# Patient Record
Sex: Female | Born: 1950
Health system: Southern US, Community
[De-identification: ages and names within clinical notes are randomized; demographics above are authoritative.]

## PROBLEM LIST (undated history)

## (undated) DIAGNOSIS — T4145XA Adverse effect of unspecified anesthetic, initial encounter: Secondary | ICD-10-CM

## (undated) DIAGNOSIS — K7460 Unspecified cirrhosis of liver: Secondary | ICD-10-CM

## (undated) DIAGNOSIS — E119 Type 2 diabetes mellitus without complications: Secondary | ICD-10-CM

## (undated) DIAGNOSIS — F329 Major depressive disorder, single episode, unspecified: Secondary | ICD-10-CM

## (undated) DIAGNOSIS — C50919 Malignant neoplasm of unspecified site of unspecified female breast: Secondary | ICD-10-CM

## (undated) DIAGNOSIS — I10 Essential (primary) hypertension: Secondary | ICD-10-CM

## (undated) DIAGNOSIS — F419 Anxiety disorder, unspecified: Secondary | ICD-10-CM

## (undated) DIAGNOSIS — E7800 Pure hypercholesterolemia, unspecified: Secondary | ICD-10-CM

## (undated) DIAGNOSIS — T8859XA Other complications of anesthesia, initial encounter: Secondary | ICD-10-CM

## (undated) DIAGNOSIS — D649 Anemia, unspecified: Secondary | ICD-10-CM

## (undated) DIAGNOSIS — F32A Depression, unspecified: Secondary | ICD-10-CM

## (undated) HISTORY — DX: Unspecified cirrhosis of liver: K74.60

## (undated) HISTORY — PX: TONSILLECTOMY AND ADENOIDECTOMY: SHX28

## (undated) HISTORY — PX: GANGLION CYST EXCISION: SHX1691

## (undated) HISTORY — DX: Major depressive disorder, single episode, unspecified: F32.9

## (undated) HISTORY — DX: Depression, unspecified: F32.A

## (undated) HISTORY — DX: Anemia, unspecified: D64.9

## (undated) HISTORY — DX: Pure hypercholesterolemia, unspecified: E78.00

## (undated) HISTORY — DX: Essential (primary) hypertension: I10

## (undated) HISTORY — DX: Type 2 diabetes mellitus without complications: E11.9

## (undated) HISTORY — DX: Anxiety disorder, unspecified: F41.9

## (undated) HISTORY — PX: COLONOSCOPY: SHX174

## (undated) HISTORY — PX: OTHER SURGICAL HISTORY: SHX169

## (undated) HISTORY — PX: BREAST LUMPECTOMY: SHX2

---
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
## 2015-07-25 DIAGNOSIS — Z6834 Body mass index (BMI) 34.0-34.9, adult: Secondary | ICD-10-CM | POA: Diagnosis not present

## 2015-07-25 DIAGNOSIS — E1165 Type 2 diabetes mellitus with hyperglycemia: Secondary | ICD-10-CM | POA: Diagnosis not present

## 2015-07-25 DIAGNOSIS — I10 Essential (primary) hypertension: Secondary | ICD-10-CM | POA: Diagnosis not present

## 2015-10-23 DIAGNOSIS — E1165 Type 2 diabetes mellitus with hyperglycemia: Secondary | ICD-10-CM | POA: Diagnosis not present

## 2015-10-23 DIAGNOSIS — I10 Essential (primary) hypertension: Secondary | ICD-10-CM | POA: Diagnosis not present

## 2015-10-23 DIAGNOSIS — Z0000 Encounter for general adult medical examination without abnormal findings: Secondary | ICD-10-CM | POA: Diagnosis not present

## 2015-11-06 DIAGNOSIS — R928 Other abnormal and inconclusive findings on diagnostic imaging of breast: Secondary | ICD-10-CM | POA: Diagnosis not present

## 2015-11-06 DIAGNOSIS — Z1231 Encounter for screening mammogram for malignant neoplasm of breast: Secondary | ICD-10-CM | POA: Diagnosis not present

## 2015-11-06 DIAGNOSIS — Z9889 Other specified postprocedural states: Secondary | ICD-10-CM | POA: Diagnosis not present

## 2015-11-15 DIAGNOSIS — R590 Localized enlarged lymph nodes: Secondary | ICD-10-CM | POA: Diagnosis not present

## 2015-11-15 DIAGNOSIS — N63 Unspecified lump in breast: Secondary | ICD-10-CM | POA: Diagnosis not present

## 2015-11-15 DIAGNOSIS — C50912 Malignant neoplasm of unspecified site of left female breast: Secondary | ICD-10-CM | POA: Diagnosis not present

## 2015-11-15 DIAGNOSIS — C50412 Malignant neoplasm of upper-outer quadrant of left female breast: Secondary | ICD-10-CM | POA: Diagnosis not present

## 2015-11-27 DIAGNOSIS — D0512 Intraductal carcinoma in situ of left breast: Secondary | ICD-10-CM | POA: Diagnosis not present

## 2015-11-29 DIAGNOSIS — Z7984 Long term (current) use of oral hypoglycemic drugs: Secondary | ICD-10-CM | POA: Diagnosis not present

## 2015-11-29 DIAGNOSIS — E119 Type 2 diabetes mellitus without complications: Secondary | ICD-10-CM | POA: Diagnosis not present

## 2015-11-29 DIAGNOSIS — Z884 Allergy status to anesthetic agent status: Secondary | ICD-10-CM | POA: Diagnosis not present

## 2015-11-29 DIAGNOSIS — Z9049 Acquired absence of other specified parts of digestive tract: Secondary | ICD-10-CM | POA: Diagnosis not present

## 2015-11-29 DIAGNOSIS — Z886 Allergy status to analgesic agent status: Secondary | ICD-10-CM | POA: Diagnosis not present

## 2015-11-29 DIAGNOSIS — F17210 Nicotine dependence, cigarettes, uncomplicated: Secondary | ICD-10-CM | POA: Diagnosis not present

## 2015-11-29 DIAGNOSIS — I10 Essential (primary) hypertension: Secondary | ICD-10-CM | POA: Diagnosis not present

## 2015-11-29 DIAGNOSIS — Z8249 Family history of ischemic heart disease and other diseases of the circulatory system: Secondary | ICD-10-CM | POA: Diagnosis not present

## 2015-11-29 DIAGNOSIS — D0512 Intraductal carcinoma in situ of left breast: Secondary | ICD-10-CM | POA: Diagnosis not present

## 2015-11-29 DIAGNOSIS — Z79899 Other long term (current) drug therapy: Secondary | ICD-10-CM | POA: Diagnosis not present

## 2015-11-29 DIAGNOSIS — Z800 Family history of malignant neoplasm of digestive organs: Secondary | ICD-10-CM | POA: Diagnosis not present

## 2015-11-29 DIAGNOSIS — Z836 Family history of other diseases of the respiratory system: Secondary | ICD-10-CM | POA: Diagnosis not present

## 2015-11-30 DIAGNOSIS — Z800 Family history of malignant neoplasm of digestive organs: Secondary | ICD-10-CM | POA: Diagnosis not present

## 2015-11-30 DIAGNOSIS — I10 Essential (primary) hypertension: Secondary | ICD-10-CM | POA: Diagnosis not present

## 2015-11-30 DIAGNOSIS — Z886 Allergy status to analgesic agent status: Secondary | ICD-10-CM | POA: Diagnosis not present

## 2015-11-30 DIAGNOSIS — Z7984 Long term (current) use of oral hypoglycemic drugs: Secondary | ICD-10-CM | POA: Diagnosis not present

## 2015-11-30 DIAGNOSIS — E119 Type 2 diabetes mellitus without complications: Secondary | ICD-10-CM | POA: Diagnosis not present

## 2015-11-30 DIAGNOSIS — R928 Other abnormal and inconclusive findings on diagnostic imaging of breast: Secondary | ICD-10-CM | POA: Diagnosis not present

## 2015-11-30 DIAGNOSIS — D0512 Intraductal carcinoma in situ of left breast: Secondary | ICD-10-CM | POA: Diagnosis not present

## 2015-11-30 DIAGNOSIS — Z79899 Other long term (current) drug therapy: Secondary | ICD-10-CM | POA: Diagnosis not present

## 2015-11-30 DIAGNOSIS — Z9049 Acquired absence of other specified parts of digestive tract: Secondary | ICD-10-CM | POA: Diagnosis not present

## 2015-11-30 DIAGNOSIS — Z8249 Family history of ischemic heart disease and other diseases of the circulatory system: Secondary | ICD-10-CM | POA: Diagnosis not present

## 2015-11-30 DIAGNOSIS — Z836 Family history of other diseases of the respiratory system: Secondary | ICD-10-CM | POA: Diagnosis not present

## 2015-11-30 DIAGNOSIS — F17210 Nicotine dependence, cigarettes, uncomplicated: Secondary | ICD-10-CM | POA: Diagnosis not present

## 2015-11-30 DIAGNOSIS — C50912 Malignant neoplasm of unspecified site of left female breast: Secondary | ICD-10-CM | POA: Diagnosis not present

## 2015-11-30 DIAGNOSIS — Z884 Allergy status to anesthetic agent status: Secondary | ICD-10-CM | POA: Diagnosis not present

## 2015-12-05 DIAGNOSIS — H2513 Age-related nuclear cataract, bilateral: Secondary | ICD-10-CM | POA: Diagnosis not present

## 2015-12-05 DIAGNOSIS — E119 Type 2 diabetes mellitus without complications: Secondary | ICD-10-CM | POA: Diagnosis not present

## 2015-12-05 DIAGNOSIS — H538 Other visual disturbances: Secondary | ICD-10-CM | POA: Diagnosis not present

## 2015-12-11 DIAGNOSIS — L03818 Cellulitis of other sites: Secondary | ICD-10-CM | POA: Diagnosis not present

## 2016-01-01 ENCOUNTER — Encounter (HOSPITAL_COMMUNITY): Payer: Self-pay | Admitting: Hematology & Oncology

## 2016-01-01 ENCOUNTER — Encounter (HOSPITAL_COMMUNITY): Payer: Medicare Other | Attending: Hematology & Oncology | Admitting: Hematology & Oncology

## 2016-01-01 VITALS — BP 128/59 | HR 61 | Temp 98.1°F | Resp 16 | Ht 67.0 in | Wt 226.1 lb

## 2016-01-01 DIAGNOSIS — F329 Major depressive disorder, single episode, unspecified: Secondary | ICD-10-CM | POA: Insufficient documentation

## 2016-01-01 DIAGNOSIS — Z0000 Encounter for general adult medical examination without abnormal findings: Secondary | ICD-10-CM

## 2016-01-01 DIAGNOSIS — C50912 Malignant neoplasm of unspecified site of left female breast: Secondary | ICD-10-CM | POA: Insufficient documentation

## 2016-01-01 DIAGNOSIS — Z171 Estrogen receptor negative status [ER-]: Secondary | ICD-10-CM

## 2016-01-01 DIAGNOSIS — I10 Essential (primary) hypertension: Secondary | ICD-10-CM | POA: Insufficient documentation

## 2016-01-01 DIAGNOSIS — C50412 Malignant neoplasm of upper-outer quadrant of left female breast: Secondary | ICD-10-CM | POA: Insufficient documentation

## 2016-01-01 DIAGNOSIS — Z720 Tobacco use: Secondary | ICD-10-CM

## 2016-01-01 DIAGNOSIS — F419 Anxiety disorder, unspecified: Secondary | ICD-10-CM | POA: Insufficient documentation

## 2016-01-01 DIAGNOSIS — F17210 Nicotine dependence, cigarettes, uncomplicated: Secondary | ICD-10-CM | POA: Insufficient documentation

## 2016-01-01 DIAGNOSIS — Z79899 Other long term (current) drug therapy: Secondary | ICD-10-CM | POA: Insufficient documentation

## 2016-01-01 DIAGNOSIS — Z7984 Long term (current) use of oral hypoglycemic drugs: Secondary | ICD-10-CM | POA: Insufficient documentation

## 2016-01-01 DIAGNOSIS — Z9889 Other specified postprocedural states: Secondary | ICD-10-CM | POA: Insufficient documentation

## 2016-01-01 MED ORDER — PROCHLORPERAZINE MALEATE 10 MG PO TABS: 10.0000 mg | ORAL_TABLET | Freq: Four times a day (QID) | ORAL | Status: DC | PRN

## 2016-01-01 MED ORDER — ONDANSETRON HCL 8 MG PO TABS: 8.0000 mg | ORAL_TABLET | Freq: Three times a day (TID) | ORAL | Status: DC | PRN

## 2016-01-01 MED ORDER — LIDOCAINE-PRILOCAINE 2.5-2.5 % EX CREA: TOPICAL_CREAM | CUTANEOUS | Status: DC

## 2016-01-01 MED ORDER — DEXAMETHASONE 4 MG PO TABS: ORAL_TABLET | ORAL | Status: DC

## 2016-01-01 NOTE — Patient Instructions (Signed)
[facility]   CHEMOTHERAPY INSTRUCTIONS  Dr. Whitney Muse is your oncologist and she has approved you taking the following drugs for the treatment of your breast cancer. Taxotere and Cytoxan.   You can reach [place] @ [phone] - this number is available 24 hours a day and during after-hours this number will ring over to a nurse on call in [place].   Taxotere and Cytoxan will be given once every 21 days x 4 cycles.    You will be given a medication after the completion of these drugs (x 4 cycles) called Neulasta. We will have you wear the Neulasta On Pro. This is a device that you will wear for 28 hours. Once the medication is finished infusing, you can take it off and throw it away. This medication is being given to boost your white blood cell production. The purpose of this medication is to decrease the amount of days that your white blood cell count stays low. It also helps to keep you on time with each cycle of chemo. The nurse administering chemo to you will teach you about this injection.  You will be at the [facility] approximately 4 hours when you get treated. It may be a little longer depending on laboratory wait time (analyzing your blood work) and pharmacy wait time (the time it takes them to mix your chemotherapy).    Side Effects listed below:  Taxotere - bone marrow suppression (lowers white blood cells (fight infection), lowers red blood cells (make up your blood), lowers platelets (help blood to clot). This chemo can cause fluid retention. You will be responsible for taking a steroid called Dexamethasone at home the day before, day of and day after Taxotere. This steroid will keep you from having fluid retention. Take it whether you think you need it or not. Can cause hair loss, skin/nail changes (darkening of the nail beds, pain where the nail bed meets the skin, loosening of the nail beds, dry skin, palms of hands and soles of feet may darken or  get sensitive, nausea/vomiting, paresthesia (numbness or tingling) in extremities - we need to know if this develops, mucositis (inflammation of any mucosal membrane - the mouth, throat), mouth sores, neurotoxicity (loss of memory, headaches, trouble sleeping, etc.), can also cause excessive tear production. Please let us know if any side effect develops. (takes 1 hour to infuse)  Cytoxan - can cause hemorrhagic cystitis (bloody urine) - this chemo irritates your bladder! We need you drinking 64 oz of fluid (preferably water/decaff fluids) 2 days prior to chemo and for up to 4-5 days after chemo. Drink more if you can. Do not hold your urine. Urinate before you go to bed and if you wake up in the middle of the night. This can also cause nausea/vomiting and hair loss. (takes 30 minutes to infuse)  Neulasta - this medication is not chemo but being given because you have had chemo. It is usually given 27 hours after the completion of chemotherapy. This medication works by boosting your bone marrow's supply of white blood cells. White blood cells are what protect our bodies against infection. The medication is given in the form of a subcutaneous injection. It is given in the fatty tissue of your abdomen. It is a short needle. The major side effect of this medication is bone or muscle pain. The drug of choice to relieve or lessen the pain is Aleve or Ibuprofen. If a physician has ever told you not  to take Aleve or Ibuprofen - then don't take it. You should then take Tylenol/acetaminophen. Take either medication as the bottle directs you to.  The level of pain you experience as a result of this injection can range from none, to mild or moderate, or severe. Please let us know if you develop moderate or severe bone pain.       POTENTIAL SIDE EFFECTS OF TREATMENT: Increased Susceptibility to Infection, Vomiting, Constipation, Hair Thinning, Changes in Character of Skin and Nails (brittleness, dryness,etc.), Pigment  Changes (darkening of nail beds, palms of hands, soles of feet, etc.), Bone Marrow Suppression, Abdominal Cramping, Complete Hair Loss, Nausea, Diarrhea, Sun Sensitivity and Mouth Sores   SELF IMAGE NEEDS AND REFERRALS MADE: Obtain hair accessories as soon as possible (wigs, scarves, turbans,caps,etc.) and Referral to Look Good, Feel Better consultant      MEDICATIONS: You have been given prescriptions for the following medications:  Dexamethasone 41m tablet. The day before, day of, and day after chemo take 2 tabs in the am and 2 tabs in the pm. Take with food.  Zofran/Ondansetron 823mtablet. Take 1 tablet every 8 hours as needed for nausea/vomiting. (#1 nausea med to take, this can constipate)  Compazine/Prochlorperazine 1037mablet. Take 1 tablet every 6 hours as needed for nausea/vomiting. (#2 nausea med to take, this can make you sleepy)   EMLA cream. Apply a quarter size amount to port site 1 hour prior to chemo. Do not rub in. Cover with plastic wrap.   Over-the-Counter Meds:  Miralax 1 capful in 8 oz of fluid daily. May increase to two times a day if needed. This is a stool softener. If this doesn't work proceed you can add:  Senokot S  - start with 1 tablet two times a day and increase to 4 tablets two times a day if needed. (total of 8 tablets in a 24 hour period). This is a stimulant laxative.   Call us [place] this does not help your bowels move.   Imodium 2mg45mpsule. Take 2 capsules after the 1st loose stool and then 1 capsule every 2 hours until you go a total of 12 hours without having a loose stool. Call the [facility] continue. If diarrhea occurs @ bedtime, take 2 capsules @ bedtime. Then take 2 capsules every 4 hours until morning. Call [facility]  SYMPTOMS TO REPORT AS SOON AS POSSIBLE AFTER TREATMENT:  FEVER GREATER THAN 100.5 F  CHILLS WITH OR WITHOUT FEVER  NAUSEA AND VOMITING THAT IS NOT CONTROLLED WITH YOUR NAUSEA  MEDICATION  UNUSUAL SHORTNESS OF BREATH  UNUSUAL BRUISING OR BLEEDING  TENDERNESS IN MOUTH AND THROAT WITH OR WITHOUT PRESENCE OF ULCERS  URINARY PROBLEMS  BOWEL PROBLEMS  UNUSUAL RASH    Wear comfortable clothing and clothing appropriate for easy access to any Portacath or PICC line. Let us [place] if there is anything that we can do to make your therapy better!      I have been informed and understand all of the instructions given to me and have received a copy. I have been instructed to call the clinic ([phone] family physician as soon as possible for continued medical care, if indicated. I do not have any more questions at this time but understand that I may call the [facility] Patient Navigator at ([phone] office hours should I have questions or need assistance in obtaining follow-up care.

## 2016-01-01 NOTE — Progress Notes (Signed)
[place]  CONSULT NOTE  No care team member to display  CHIEF COMPLAINTS/PURPOSE OF CONSULTATION:    Intraductal carcinoma of left breast   11/30/2015 Surgery Lumpectomy and sentinel node procedure with Dr. Ladona Horns   11/30/2015 Surgery Needle localization of the L breast with mammo guidance   11/30/2015 Pathology Results L breast invasive ductal carcinoma, mod diff, 0.8 cm, focal DCIS, high grade, 2/2 LN negative, no LVI, pT1b, pN0M0, ER- PR- HER2 - Ki-67 92%    HISTORY OF PRESENTING ILLNESS:  Taylor Williams 65 y.o. female is here because of newly diagnosed triple negative carcinoma of the L breast. She has undergone definitive surgical therapy with Dr. Ladona Horn[place].  She notes she received mammograms yearly until 2008. She went for routine screening mammography in April and was advised it was abnormal. She notes she never "felt anything" abnormal in her breast. She reports a history of dense breasts.  She is post menopausal having undergone natural menopause. She has not had a hysterectomy. She has not had routine pap smears. In regards to other well care, she has had routine screening colonoscopies.  She notes that her cancer is negative for ER and PR. She also notes that she was told she may need chemotherapy.  She feels optimistic in regards to her prognosis. She presents today for additional discussion of her breast cancer and recommendations regarding therapy.  A lung cancer screening counseling and shared decision making visit. Age: 65 Pack year smoking history: 78 Current smoker or < 15 years of cessation CURRENT CIGARETTE SMOKER   No current symptoms of lung cancer Risks and benefits of lung cancer screening discussed:  Negative- over-diagnosis, radiation exposure, false positives, and additional testing  Positive- discover early stage lung cancer resulting in higher incidence of cure Patient educated regarding the importance of adherence to continued lung cancer  screening. Currently, there are no co-morbidities to prevent treatment to therapy for lung cancer and the patient is agreeable to pursue treatment if a malignancy is discovered.  [place] Preventative Services Task Force recommend annual screening for lung cancer with low-dose CT in adults aged 66- 66 years who have a 30 pack year smoking history and currently smoke or have quit smoking within the past 15 years.  Screening should be discontinued once a person has not smoked for 15 years or develops a health problem that substantially limits life expectancy or the ability or willingness to have curative lung surgery.  It is a category B recommendation.  Similar stances are provided by CMS, NCCN, and AATS.  Currently, there are no co-morbidities to prevent treatment to therapy for lung cancer and the patient is agreeable to pursue treatment if a malignancy is discovered.   MEDICAL HISTORY:  Past Medical History  Diagnosis Date  . Depression   . Hypertension   . High cholesterol   . Anxiety     SURGICAL HISTORY: Past Surgical History  Procedure Laterality Date  . Breast lumpectomy Left     states for ductal carcinoma, had surgery in late may    SOCIAL HISTORY: Social History   Social History  . Marital Status: Widowed    Spouse Name: N/A  . Number of Children: N/A  . Years of Education: N/A   Occupational History  . Not on file.   Social History Main Topics  . Smoking status: Current Every Day Smoker -- 1.00 packs/day for 40 years  . Smokeless tobacco: Not on file  . Alcohol Use: No  .  Drug Use: No  . Sexual Activity: Not on file   Other Topics Concern  . Not on file   Social History Narrative  . No narrative on file  Widowed for 40 years. Her husband was killed in an MVA. He was in the army and they were living in [place] at the time. No children. She never remarried. She has a close family, a close great nephew. Born in [place]. 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  Smoker cigarettes 1ppd for greater than 40 years.  No ETOH. Retired. Enjoys Haematologist, Barrister's clerk, volunteers at [employer].   FAMILY HISTORY: History reviewed. No pertinent family history.  Mother deceased at 60 from colon cancer Father deceased at 24 from complications of COPD, dementia One Brother healthy Maternal brother deceased from esophageal cancer Paternal grandmother had colon cancer and grandfather had lung cancer  ALLERGIES:  is allergic to aspirin; other; and fish allergy.  MEDICATIONS:  Current Outpatient Prescriptions  Medication Sig Dispense Refill  . Cyclophosphamide (CYTOXAN IJ) Inject as directed. Has not started yet - to be given every 21 days x 4 cycles    . DOCEtaxel (TAXOTERE IV) Inject into the vein. Has not started yet - to be given every 21 days x 4 cycles    . Pegfilgrastim (NEULASTA DELIVERY KIT [place]) Inject into the skin. Has not started yet - to be given every 21 days x 4 cycles (after completing chemo)    . ALPRAZolam (XANAX) 0.25 MG tablet Take 0.25 mg by mouth 2 (two) times daily.    . cetirizine (ZYRTEC) 10 MG tablet Take 10 mg by mouth daily.    . citalopram (CELEXA) 20 MG tablet Take 20 mg by mouth daily.    Marland Kitchen dexamethasone (DECADRON) 4 MG tablet The day before, day of, and day after chemo take 2 tabs in the am and 2 tabs in the pm. 48 tablet 0  . glipiZIDE (GLUCOTROL) 10 MG tablet Take 10 mg by mouth 2 (two) times daily before a meal.    . lidocaine-prilocaine (EMLA) cream Apply a quarter size amount to port site 1 hour prior to chemo. Do not rub in. Cover with plastic wrap. 30 g 3  . lisinopril-hydrochlorothiazide (PRINZIDE,ZESTORETIC) 20-12.5 MG tablet Take 1 tablet by mouth daily.    Marland Kitchen loperamide (IMODIUM A-D) 2 MG tablet Take 4 mg by mouth 2 (two) times daily.    . metFORMIN (GLUCOPHAGE) 1000 MG tablet Take 1,000 mg by mouth 2 (two) times daily with a meal.    . omega-3 acid ethyl esters (LOVAZA) 1 g capsule Take  1,500 mg by mouth 2 (two) times daily.    . ondansetron (ZOFRAN) 8 MG tablet Take 1 tablet (8 mg total) by mouth every 8 (eight) hours as needed for nausea or vomiting. 30 tablet 2  . prochlorperazine (COMPAZINE) 10 MG tablet Take 1 tablet (10 mg total) by mouth every 6 (six) hours as needed for nausea or vomiting. 30 tablet 2  . sitaGLIPtin (JANUVIA) 100 MG tablet Take 100 mg by mouth daily.     No current facility-administered medications for this visit.   Review of Systems  Constitutional: Negative for fever, chills, weight loss and malaise/fatigue.  HENT: Negative for congestion, hearing loss, nosebleeds, sore throat and tinnitus.   Eyes: Negative for blurred vision, double vision, pain and discharge.  Respiratory: Negative for cough, hemoptysis, sputum production, shortness of breath and wheezing.   Cardiovascular: Negative for chest  pain, palpitations, claudication, leg swelling and PND.  Gastrointestinal: Negative for heartburn, nausea, vomiting, abdominal pain, diarrhea, constipation, blood in stool and melena.  Genitourinary: Negative for dysuria, urgency, frequency and hematuria.  Musculoskeletal: Negative for myalgias, joint pain and falls.  Skin: Negative for itching and rash.  Neurological: Negative for dizziness, tingling, tremors, sensory change, speech change, focal weakness, seizures, loss of consciousness, weakness and headaches.  Endo/Heme/Allergies: Does not bruise/bleed easily.  Psychiatric/Behavioral: Negative for depression, suicidal ideas, memory loss and substance abuse. The patient is not nervous/anxious and does not have insomnia.    14 point ROS was done and is otherwise as detailed above or in HPI  PHYSICAL EXAMINATION: ECOG PERFORMANCE STATUS: 0 - Asymptomatic  Filed Vitals:   01/01/16 1442  BP: 128/59  Pulse: 61  Temp: 98.1 F (36.7 C)  Resp: 16   Filed Weights   01/01/16 1442  Weight: 226 lb 1.6 oz (102.558 kg)   Physical Exam  Constitutional:  She is oriented to person, place, and time and well-developed, well-nourished, and in no distress.  HENT:  Head: Normocephalic and atraumatic.  Nose: Nose normal.  Mouth/Throat: Oropharynx is clear and moist. No oropharyngeal exudate.  Eyes: Conjunctivae and EOM are normal. Pupils are equal, round, and reactive to light. Right eye exhibits no discharge. Left eye exhibits no discharge. No scleral icterus.  Neck: Normal range of motion. Neck supple. No tracheal deviation present. No thyromegaly present.  Cardiovascular: Normal rate, regular rhythm and normal heart sounds.  Exam reveals no gallop and no friction rub.   No murmur heard. Pulmonary/Chest: Effort normal and breath sounds normal. She has no wheezes. She has no rales.  Abdominal: Soft. Bowel sounds are normal. She exhibits no distension and no mass. There is no tenderness. There is no rebound and no guarding.  Musculoskeletal: Normal range of motion. She exhibits no edema.  Lymphadenopathy:    She has no cervical adenopathy.  Neurological: She is alert and oriented to person, place, and time. She has normal reflexes. No cranial nerve deficit. Gait normal. Coordination normal.  Skin: Skin is warm and dry. No rash noted.  Psychiatric: Mood, memory, affect and judgment normal.  Nursing note and vitals reviewed. Left breast examined with well healing breast and axillary incision sites. No erythema, no drainage. Good ROM of LUE. No lymphedema.  LABORATORY DATA:  I have reviewed the data as listed No results found for: WBC, HGB, HCT, MCV, PLT CMP  No results found for: NA, K, CL, CO2, GLUCOSE, BUN, CREATININE, CALCIUM, PROT, ALBUMIN, AST, ALT, ALKPHOS, BILITOT, GFRNONAA, GFRAA  RADIOGRAPHIC STUDIES: I have personally reviewed the radiological images as listed and agreed with the findings in the report. No results found.  ASSESSMENT & PLAN:  Stage I ER- PR- HER 2 neu - carcinoma of the L breast Lumpectomy and sentinel LN biopsy with  Dr. Ladona Horns on 11/30/2015 Tobacco Abuse  We discussed breast cancer in a general sense. We reviewed hormone receptors and HER 2 in detail.  We discussed triple negative breast cancer. We reviewed NCCN guidelines in regards to triple negative breast cancer. I advised her that based upon her tumor size current recommendations were to consider chemotherapy. After a lengthy discussion she wished to proceed. I have recommended TC x 4. We discussed risks/benefits of chemotherapy with risks including but not limited to hair loss, increased risk of infections, low blood counts, neuropathy.   She will attend chemotherapy class on Tuesday. I have referred her back to Dr. Ladona Horns for  port a cath placement.  We discussed smoking cessation in detail. She has a significant smoking history (see HPI) I have ordered CT screening. She is up to date in regards to colonoscopy.   We reviewed the role of adjuvant XRT in the management of breast cancer after lumpectomy.  She understands that this will occur after completion of chemotherapy.  I will see her back post port placement. She was provided with multiple sources of educational information.  I advised her to bring questions to her return appointment.  ORDERS PLACED FOR THIS ENCOUNTER: Orders Placed This Encounter  Procedures  . CT CHEST LUNG CA SCREEN LOW DOSE W/O CM   All questions were answered. The patient knows to call the clinic with any problems, questions or concerns.  This note was electronically signed.    Molli Hazard, MD  01/03/2016 8:31 AM

## 2016-01-01 NOTE — Patient Instructions (Signed)
[place] at [facility] Discharge Instructions  RECOMMENDATIONS MADE BY THE CONSULTANT AND ANY TEST RESULTS WILL BE SENT TO YOUR REFERRING PHYSICIAN.  CT screen of chest due to smoking history  Triple negative breast cancer  You will need a port  Taxotere and Cytoxan x 4 cycles.   Chemo teaching this Wednesday. Come to [facility] and let the receptionist know that you are there for chemo teaching. You will go to the [place]. This occurs from 11-12:30 on Wednesdays.   Thank you for choosing [place] at [facility] to provide your oncology and hematology care.  To afford each patient quality time with our provider, please arrive at least 15 minutes before your scheduled appointment time.   Beginning January 23rd 2017 lab work for the [facility] will be done in the  Main lab at [facility] on 1st floor. If you have a lab appointment with the [place] please come in thru the  Main Entrance and check in at the main information desk  You need to re-schedule your appointment should you arrive 10 or more minutes late.  We strive to give you quality time with our providers, and arriving late affects you and other patients whose appointments are after yours.  Also, if you no show three or more times for appointments you may be dismissed from the clinic at the providers discretion.     Again, thank you for choosing [facility].  Our hope is that these requests will decrease the amount of time that you wait before being seen by our physicians.       _____________________________________________________________  Should you have questions after your visit to [facility], please contact our office at [phone] between the hours of 8:30 a.m. and 4:30 p.m.  Voicemails left after 4:30 p.m. will not be returned until the following business day.  For prescription refill requests, have your pharmacy contact our  office.         Resources For Cancer Patients and their Caregivers ? American Cancer Society: Can assist with transportation, wigs, general needs, runs Look Good Feel Better.        551-536-7611 ? Cancer Care: Provides financial assistance, online support groups, medication/co-pay assistance.  1-800-813-HOPE 4056572683) ? [facility] Assists [place] cancer patients and their families through emotional , educational and financial support.  (705)596-3261 ? [place] DSS Where to apply for food stamps, Medicaid and utility assistance. 319-681-4775 ? RCATS: Transportation to medical appointments. 931-015-2985 ? Social Security Administration: May apply for disability if have a Stage IV cancer. 276-352-9671 579-615-4648 ? [employer], Disability and Transit Services: Assists with nutrition, care and transit needs. [place] Support Programs: @10RELATIVEDAYS @ > Cancer Support Group  2nd Tuesday of the month 1pm-2pm, Journey Room  > Creative Journey  3rd Tuesday of the month 1130am-1pm, Journey Room  > Look Good Feel Better  1st Wednesday of the month 10am-12 noon, Journey Room (Call [place] to register [phone])

## 2016-01-02 ENCOUNTER — Encounter (HOSPITAL_COMMUNITY): Payer: Self-pay | Attending: Hematology & Oncology

## 2016-01-02 DIAGNOSIS — D0512 Intraductal carcinoma in situ of left breast: Secondary | ICD-10-CM

## 2016-01-03 ENCOUNTER — Encounter (HOSPITAL_COMMUNITY): Payer: Self-pay | Admitting: Hematology & Oncology

## 2016-01-03 NOTE — Progress Notes (Signed)
Pt had chemo class in the Journey Room on 01/02/16

## 2016-01-09 ENCOUNTER — Other Ambulatory Visit (HOSPITAL_COMMUNITY): Payer: Self-pay | Admitting: *Deleted

## 2016-01-09 DIAGNOSIS — C50912 Malignant neoplasm of unspecified site of left female breast: Secondary | ICD-10-CM | POA: Diagnosis not present

## 2016-01-09 DIAGNOSIS — Z886 Allergy status to analgesic agent status: Secondary | ICD-10-CM | POA: Diagnosis not present

## 2016-01-09 DIAGNOSIS — C50412 Malignant neoplasm of upper-outer quadrant of left female breast: Secondary | ICD-10-CM

## 2016-01-09 DIAGNOSIS — D0512 Intraductal carcinoma in situ of left breast: Secondary | ICD-10-CM | POA: Diagnosis not present

## 2016-01-09 DIAGNOSIS — Z884 Allergy status to anesthetic agent status: Secondary | ICD-10-CM | POA: Diagnosis not present

## 2016-01-09 DIAGNOSIS — Z452 Encounter for adjustment and management of vascular access device: Secondary | ICD-10-CM | POA: Diagnosis not present

## 2016-01-09 DIAGNOSIS — Z7984 Long term (current) use of oral hypoglycemic drugs: Secondary | ICD-10-CM | POA: Diagnosis not present

## 2016-01-09 DIAGNOSIS — C50919 Malignant neoplasm of unspecified site of unspecified female breast: Secondary | ICD-10-CM | POA: Diagnosis not present

## 2016-01-09 DIAGNOSIS — F418 Other specified anxiety disorders: Secondary | ICD-10-CM | POA: Diagnosis not present

## 2016-01-09 DIAGNOSIS — I10 Essential (primary) hypertension: Secondary | ICD-10-CM | POA: Diagnosis not present

## 2016-01-09 DIAGNOSIS — Z79899 Other long term (current) drug therapy: Secondary | ICD-10-CM | POA: Diagnosis not present

## 2016-01-09 DIAGNOSIS — E119 Type 2 diabetes mellitus without complications: Secondary | ICD-10-CM | POA: Diagnosis not present

## 2016-01-09 MED ORDER — DEXAMETHASONE 4 MG PO TABS: ORAL_TABLET | ORAL | Status: DC

## 2016-01-12 ENCOUNTER — Encounter (HOSPITAL_COMMUNITY): Payer: Self-pay

## 2016-01-12 ENCOUNTER — Encounter (HOSPITAL_BASED_OUTPATIENT_CLINIC_OR_DEPARTMENT_OTHER): Payer: Medicare Other

## 2016-01-12 ENCOUNTER — Inpatient Hospital Stay (HOSPITAL_COMMUNITY): Payer: Medicare Other

## 2016-01-12 ENCOUNTER — Encounter (HOSPITAL_BASED_OUTPATIENT_CLINIC_OR_DEPARTMENT_OTHER): Payer: Medicare Other | Admitting: Hematology & Oncology

## 2016-01-12 VITALS — BP 132/53 | HR 64 | Temp 97.5°F | Resp 18 | Wt 226.6 lb

## 2016-01-12 DIAGNOSIS — Z5111 Encounter for antineoplastic chemotherapy: Secondary | ICD-10-CM

## 2016-01-12 DIAGNOSIS — C50412 Malignant neoplasm of upper-outer quadrant of left female breast: Secondary | ICD-10-CM

## 2016-01-12 DIAGNOSIS — Z79899 Other long term (current) drug therapy: Secondary | ICD-10-CM | POA: Diagnosis not present

## 2016-01-12 DIAGNOSIS — Z7984 Long term (current) use of oral hypoglycemic drugs: Secondary | ICD-10-CM | POA: Diagnosis not present

## 2016-01-12 DIAGNOSIS — Z720 Tobacco use: Secondary | ICD-10-CM | POA: Diagnosis not present

## 2016-01-12 DIAGNOSIS — R739 Hyperglycemia, unspecified: Secondary | ICD-10-CM

## 2016-01-12 DIAGNOSIS — Z5189 Encounter for other specified aftercare: Secondary | ICD-10-CM | POA: Diagnosis not present

## 2016-01-12 DIAGNOSIS — C50912 Malignant neoplasm of unspecified site of left female breast: Secondary | ICD-10-CM | POA: Diagnosis not present

## 2016-01-12 DIAGNOSIS — F329 Major depressive disorder, single episode, unspecified: Secondary | ICD-10-CM | POA: Diagnosis not present

## 2016-01-12 DIAGNOSIS — F17210 Nicotine dependence, cigarettes, uncomplicated: Secondary | ICD-10-CM | POA: Diagnosis not present

## 2016-01-12 DIAGNOSIS — I10 Essential (primary) hypertension: Secondary | ICD-10-CM | POA: Diagnosis not present

## 2016-01-12 DIAGNOSIS — Z9889 Other specified postprocedural states: Secondary | ICD-10-CM | POA: Diagnosis not present

## 2016-01-12 DIAGNOSIS — F419 Anxiety disorder, unspecified: Secondary | ICD-10-CM | POA: Diagnosis not present

## 2016-01-12 LAB — COMPREHENSIVE METABOLIC PANEL
ALT: 20 U/L (ref 14–54)
AST: 23 U/L (ref 15–41)
Albumin: 3.4 g/dL — ABNORMAL LOW (ref 3.5–5.0)
Alkaline Phosphatase: 45 U/L (ref 38–126)
Anion gap: 7 (ref 5–15)
BUN: 13 mg/dL (ref 6–20)
CHLORIDE: 98 mmol/L — AB (ref 101–111)
CO2: 26 mmol/L (ref 22–32)
CREATININE: 0.68 mg/dL (ref 0.44–1.00)
Calcium: 9.2 mg/dL (ref 8.9–10.3)
GFR calc Af Amer: >60 mL/min (ref >60)
GLUCOSE: 281 mg/dL — AB (ref 65–99)
POTASSIUM: 4.3 mmol/L (ref 3.5–5.1)
Sodium: 131 mmol/L — ABNORMAL LOW (ref 135–145)
Total Bilirubin: 0.5 mg/dL (ref 0.3–1.2)
Total Protein: 7.3 g/dL (ref 6.5–8.1)

## 2016-01-12 LAB — CBC WITH DIFFERENTIAL/PLATELET
Basophils Absolute: 0 10*3/uL (ref 0.0–0.1)
Basophils Relative: 0 %
Eosinophils Absolute: 0 10*3/uL (ref 0.0–0.7)
Eosinophils Relative: 0 %
HEMATOCRIT: 37.4 % (ref 36.0–46.0)
HEMOGLOBIN: 12.7 g/dL (ref 12.0–15.0)
LYMPHS ABS: 1.1 10*3/uL (ref 0.7–4.0)
Lymphocytes Relative: 12 %
MCH: 28.8 pg (ref 26.0–34.0)
MCHC: 34 g/dL (ref 30.0–36.0)
MCV: 84.8 fL (ref 78.0–100.0)
MONO ABS: 0.5 10*3/uL (ref 0.1–1.0)
MONOS PCT: 6 %
NEUTROS ABS: 6.9 10*3/uL (ref 1.7–7.7)
NEUTROS PCT: 81 %
Platelets: 225 10*3/uL (ref 150–400)
RBC: 4.41 MIL/uL (ref 3.87–5.11)
RDW: 14.9 % (ref 11.5–15.5)
WBC: 8.5 10*3/uL (ref 4.0–10.5)

## 2016-01-12 MED ORDER — LORAZEPAM 2 MG/ML IJ SOLN
0.5000 mg | Freq: Once | INTRAMUSCULAR | Status: AC
  Administered 2016-01-12: 11:00:00 via INTRAVENOUS
  Filled 2016-01-12: qty 1

## 2016-01-12 MED ORDER — LORAZEPAM 0.5 MG PO TABS: 0.5000 mg | ORAL_TABLET | ORAL | Status: DC | PRN

## 2016-01-12 MED ORDER — PROMETHAZINE HCL 25 MG PO TABS: 25.0000 mg | ORAL_TABLET | Freq: Four times a day (QID) | ORAL | Status: DC | PRN

## 2016-01-12 MED ORDER — SODIUM CHLORIDE 0.9 % IV SOLN
600.0000 mg/m2 | Freq: Once | INTRAVENOUS | Status: AC
  Administered 2016-01-12: 1320 mg via INTRAVENOUS
  Filled 2016-01-12: qty 50

## 2016-01-12 MED ORDER — SODIUM CHLORIDE 0.9% FLUSH: 10.0000 mL | INTRAVENOUS | Status: DC | PRN

## 2016-01-12 MED ORDER — PALONOSETRON HCL INJECTION 0.25 MG/5ML
INTRAVENOUS | Status: AC
  Filled 2016-01-12: qty 5

## 2016-01-12 MED ORDER — SODIUM CHLORIDE 0.9 % IV SOLN
Freq: Once | INTRAVENOUS | Status: AC
Start: 2016-01-12 — End: 2016-01-12
  Administered 2016-01-12: 11:00:00 via INTRAVENOUS

## 2016-01-12 MED ORDER — PEGFILGRASTIM 6 MG/0.6ML ~~LOC~~ PSKT
6.0000 mg | PREFILLED_SYRINGE | Freq: Once | SUBCUTANEOUS | Status: AC
  Administered 2016-01-12: 6 mg via SUBCUTANEOUS

## 2016-01-12 MED ORDER — INSULIN ASPART 100 UNIT/ML ~~LOC~~ SOLN
6.0000 [IU] | Freq: Once | SUBCUTANEOUS | Status: AC
  Administered 2016-01-12: 6 [IU] via SUBCUTANEOUS
  Filled 2016-01-12: qty 0.06

## 2016-01-12 MED ORDER — PEGFILGRASTIM 6 MG/0.6ML ~~LOC~~ PSKT
PREFILLED_SYRINGE | SUBCUTANEOUS | Status: AC
  Filled 2016-01-12: qty 0.6

## 2016-01-12 MED ORDER — DEXAMETHASONE SODIUM PHOSPHATE 100 MG/10ML IJ SOLN
10.0000 mg | Freq: Once | INTRAMUSCULAR | Status: AC
  Administered 2016-01-12: 10 mg via INTRAVENOUS
  Filled 2016-01-12: qty 1

## 2016-01-12 MED ORDER — HEPARIN SOD (PORK) LOCK FLUSH 100 UNIT/ML IV SOLN
500.0000 [IU] | Freq: Once | INTRAVENOUS | Status: AC | PRN
  Administered 2016-01-12: 500 [IU]
  Filled 2016-01-12 (×2): qty 5

## 2016-01-12 MED ORDER — PALONOSETRON HCL INJECTION 0.25 MG/5ML
0.2500 mg | Freq: Once | INTRAVENOUS | Status: AC
  Administered 2016-01-12: 0.25 mg via INTRAVENOUS

## 2016-01-12 MED ORDER — DOCETAXEL CHEMO INJECTION 160 MG/16ML
75.0000 mg/m2 | Freq: Once | INTRAVENOUS | Status: AC
  Administered 2016-01-12: 170 mg via INTRAVENOUS
  Filled 2016-01-12: qty 9

## 2016-01-12 NOTE — Progress Notes (Signed)
Taylor Williams Tolerated chemotherapy well Discharged ambulatory   .Taylor Sidles Boggspresents today for neulasta OBI placement per MD orders. OBI device filled per protocol and placed on right Upper Arm. Needle/catheter placement noted prior to patient leaving. Tolerated without incident and aware of injection to be delivered in  27 hours.

## 2016-01-12 NOTE — Patient Instructions (Signed)
[facility] Discharge Instructions for Patients Receiving Chemotherapy   Beginning January 23rd 2017 lab work for the [facility] will be done in the  Main lab at [facility] on 1st floor. If you have a lab appointment with the [facility] please come in thru the  Main Entrance and check in at the main information desk   Today you received the following chemotherapy agents taxotere and cytoxan  Side effects of taxotere that you might experience include: Low white blood cell count Fluid retention  Hair loss Neuropathy Nausea and vomiting Constipation  Taste changes fatigue Nail changes Watery eyes  Side effects that you could experience from cytoxan include: Appetite loss color change in skin Hair loss Nausea Skin rash Change in nails Vomiting  fatigue    Docetaxel injection What is this medicine? DOCETAXEL (doe se TAX el) is a chemotherapy drug. It targets fast dividing cells, like cancer cells, and causes these cells to die. This medicine is used to treat many types of cancers like breast cancer, certain stomach cancers, head and neck cancer, lung cancer, and prostate cancer. This medicine may be used for other purposes; ask your health care provider or pharmacist if you have questions. What should I tell my health care provider before I take this medicine? They need to know if you have any of these conditions: -infection (especially a virus infection such as chickenpox, cold sores, or herpes) -liver disease -low blood counts, like low white cell, platelet, or red cell counts -an unusual or allergic reaction to docetaxel, polysorbate 80, other chemotherapy agents, other medicines, foods, dyes, or preservatives -pregnant or trying to get pregnant -breast-feeding How should I use this medicine? This drug is given as an infusion into a vein. It is administered in a hospital or clinic by a specially trained health care professional. Talk to your  pediatrician regarding the use of this medicine in children. Special care may be needed. Overdosage: If you think you have taken too much of this medicine contact a poison control center or emergency room at once. NOTE: This medicine is only for you. Do not share this medicine with others. What if I miss a dose? It is important not to miss your dose. Call your doctor or health care professional if you are unable to keep an appointment. What may interact with this medicine? -cyclosporine -erythromycin -ketoconazole -medicines to increase blood counts like filgrastim, pegfilgrastim, sargramostim -vaccines Talk to your doctor or health care professional before taking any of these medicines: -acetaminophen -aspirin -ibuprofen -ketoprofen -naproxen This list may not describe all possible interactions. Give your health care provider a list of all the medicines, herbs, non-prescription drugs, or dietary supplements you use. Also tell them if you smoke, drink alcohol, or use illegal drugs. Some items may interact with your medicine. What should I watch for while using this medicine? Your condition will be monitored carefully while you are receiving this medicine. You will need important blood work done while you are taking this medicine. This drug may make you feel generally unwell. This is not uncommon, as chemotherapy can affect healthy cells as well as cancer cells. Report any side effects. Continue your course of treatment even though you feel ill unless your doctor tells you to stop. In some cases, you may be given additional medicines to help with side effects. Follow all directions for their use. Call your doctor or health care professional for advice if you get a fever, chills or sore throat, or  other symptoms of a cold or flu. Do not treat yourself. This drug decreases your body's ability to fight infections. Try to avoid being around people who are sick. This medicine may increase your risk  to bruise or bleed. Call your doctor or health care professional if you notice any unusual bleeding. This medicine may contain alcohol in the product. You may get drowsy or dizzy. Do not drive, use machinery, or do anything that needs mental alertness until you know how this medicine affects you. Do not stand or sit up quickly, especially if you are an older patient. This reduces the risk of dizzy or fainting spells. Avoid alcoholic drinks. Do not become pregnant while taking this medicine. Women should inform their doctor if they wish to become pregnant or think they might be pregnant. There is a potential for serious side effects to an unborn child. Talk to your health care professional or pharmacist for more information. Do not breast-feed an infant while taking this medicine. What side effects may I notice from receiving this medicine? Side effects that you should report to your doctor or health care professional as soon as possible: -allergic reactions like skin rash, itching or hives, swelling of the face, lips, or tongue -low blood counts - This drug may decrease the number of white blood cells, red blood cells and platelets. You may be at increased risk for infections and bleeding. -signs of infection - fever or chills, cough, sore throat, pain or difficulty passing urine -signs of decreased platelets or bleeding - bruising, pinpoint red spots on the skin, black, tarry stools, nosebleeds -signs of decreased red blood cells - unusually weak or tired, fainting spells, lightheadedness -breathing problems -fast or irregular heartbeat -low blood pressure -mouth sores -nausea and vomiting -pain, swelling, redness or irritation at the injection site -pain, tingling, numbness in the hands or feet -swelling of the ankle, feet, hands -weight gain Side effects that usually do not require medical attention (report to your prescriber or health care professional if they continue or are  bothersome): -bone pain -complete hair loss including hair on your head, underarms, pubic hair, eyebrows, and eyelashes -diarrhea -excessive tearing -changes in the color of fingernails -loosening of the fingernails -nausea -muscle pain -red flush to skin -sweating -weak or tired This list may not describe all possible side effects. Call your doctor for medical advice about side effects. You may report side effects to FDA at 1-800-FDA-1088. Where should I keep my medicine? This drug is given in a hospital or clinic and will not be stored at home. NOTE: This sheet is a summary. It may not cover all possible information. If you have questions about this medicine, talk to your doctor, pharmacist, or health care provider.    2016, Elsevier/Gold Standard. (2014-08-01 16:04:57)     Cyclophosphamide injection What is this medicine? CYCLOPHOSPHAMIDE (sye kloe FOSS fa mide) is a chemotherapy drug. It slows the growth of cancer cells. This medicine is used to treat many types of cancer like lymphoma, myeloma, leukemia, breast cancer, and ovarian cancer, to name a few. This medicine may be used for other purposes; ask your health care provider or pharmacist if you have questions. What should I tell my health care provider before I take this medicine? They need to know if you have any of these conditions: -blood disorders -history of other chemotherapy -infection -kidney disease -liver disease -recent or ongoing radiation therapy -tumors in the bone marrow -an unusual or allergic reaction to cyclophosphamide, other chemotherapy,  other medicines, foods, dyes, or preservatives -pregnant or trying to get pregnant -breast-feeding How should I use this medicine? This drug is usually given as an injection into a vein or muscle or by infusion into a vein. It is administered in a hospital or clinic by a specially trained health care professional. Talk to your pediatrician regarding the use of  this medicine in children. Special care may be needed. Overdosage: If you think you have taken too much of this medicine contact a poison control center or emergency room at once. NOTE: This medicine is only for you. Do not share this medicine with others. What if I miss a dose? It is important not to miss your dose. Call your doctor or health care professional if you are unable to keep an appointment. What may interact with this medicine? This medicine may interact with the following medications: -amiodarone -amphotericin B -azathioprine -certain antiviral medicines for HIV or AIDS such as protease inhibitors (e.g., indinavir, ritonavir) and zidovudine -certain blood pressure medications such as benazepril, captopril, enalapril, fosinopril, lisinopril, moexipril, monopril, perindopril, quinapril, ramipril, trandolapril -certain cancer medications such as anthracyclines (e.g., daunorubicin, doxorubicin), busulfan, cytarabine, paclitaxel, pentostatin, tamoxifen, trastuzumab -certain diuretics such as chlorothiazide, chlorthalidone, hydrochlorothiazide, indapamide, metolazone -certain medicines that treat or prevent blood clots like warfarin -certain muscle relaxants such as succinylcholine -cyclosporine -etanercept -indomethacin -medicines to increase blood counts like filgrastim, pegfilgrastim, sargramostim -medicines used as general anesthesia -metronidazole -natalizumab This list may not describe all possible interactions. Give your health care provider a list of all the medicines, herbs, non-prescription drugs, or dietary supplements you use. Also tell them if you smoke, drink alcohol, or use illegal drugs. Some items may interact with your medicine. What should I watch for while using this medicine? Visit your doctor for checks on your progress. This drug may make you feel generally unwell. This is not uncommon, as chemotherapy can affect healthy cells as well as cancer cells. Report any  side effects. Continue your course of treatment even though you feel ill unless your doctor tells you to stop. Drink water or other fluids as directed. Urinate often, even at night. In some cases, you may be given additional medicines to help with side effects. Follow all directions for their use. Call your doctor or health care professional for advice if you get a fever, chills or sore throat, or other symptoms of a cold or flu. Do not treat yourself. This drug decreases your body's ability to fight infections. Try to avoid being around people who are sick. This medicine may increase your risk to bruise or bleed. Call your doctor or health care professional if you notice any unusual bleeding. Be careful brushing and flossing your teeth or using a toothpick because you may get an infection or bleed more easily. If you have any dental work done, tell your dentist you are receiving this medicine. You may get drowsy or dizzy. Do not drive, use machinery, or do anything that needs mental alertness until you know how this medicine affects you. Do not become pregnant while taking this medicine or for 1 year after stopping it. Women should inform their doctor if they wish to become pregnant or think they might be pregnant. Men should not father a child while taking this medicine and for 4 months after stopping it. There is a potential for serious side effects to an unborn child. Talk to your health care professional or pharmacist for more information. Do not breast-feed an infant while taking this medicine.  This medicine may interfere with the ability to have a child. This medicine has caused ovarian failure in some women. This medicine has caused reduced sperm counts in some men. You should talk with your doctor or health care professional if you are concerned about your fertility. If you are going to have surgery, tell your doctor or health care professional that you have taken this medicine. What side effects  may I notice from receiving this medicine? Side effects that you should report to your doctor or health care professional as soon as possible: -allergic reactions like skin rash, itching or hives, swelling of the face, lips, or tongue -low blood counts - this medicine may decrease the number of white blood cells, red blood cells and platelets. You may be at increased risk for infections and bleeding. -signs of infection - fever or chills, cough, sore throat, pain or difficulty passing urine -signs of decreased platelets or bleeding - bruising, pinpoint red spots on the skin, black, tarry stools, blood in the urine -signs of decreased red blood cells - unusually weak or tired, fainting spells, lightheadedness -breathing problems -dark urine -dizziness -palpitations -swelling of the ankles, feet, hands -trouble passing urine or change in the amount of urine -weight gain -yellowing of the eyes or skin Side effects that usually do not require medical attention (report to your doctor or health care professional if they continue or are bothersome): -changes in nail or skin color -hair loss -missed menstrual periods -mouth sores -nausea, vomiting This list may not describe all possible side effects. Call your doctor for medical advice about side effects. You may report side effects to FDA at 1-800-FDA-1088. Where should I keep my medicine? This drug is given in a hospital or clinic and will not be stored at home. NOTE: This sheet is a summary. It may not cover all possible information. If you have questions about this medicine, talk to your doctor, pharmacist, or health care provider.    2016, Elsevier/Gold Standard. (2012-05-29 16:22:58)     To help prevent nausea and vomiting after your treatment, we encourage you to take your nausea medication .   If you develop nausea and vomiting, or diarrhea that is not controlled by your medication, call the clinic.  The clinic phone number is  [phone]. Office hours are Monday-Friday 8:30am-5:00pm.  BELOW ARE SYMPTOMS THAT SHOULD BE REPORTED IMMEDIATELY:  *FEVER GREATER THAN 101.0 F  *CHILLS WITH OR WITHOUT FEVER  NAUSEA AND VOMITING THAT IS NOT CONTROLLED WITH YOUR NAUSEA MEDICATION  *UNUSUAL SHORTNESS OF BREATH  *UNUSUAL BRUISING OR BLEEDING  TENDERNESS IN MOUTH AND THROAT WITH OR WITHOUT PRESENCE OF ULCERS  *URINARY PROBLEMS  *BOWEL PROBLEMS  UNUSUAL RASH Items with * indicate a potential emergency and should be followed up as soon as possible. If you have an emergency after office hours please contact your primary care physician or go to the nearest emergency department.  Please call the clinic during office hours if you have any questions or concerns.   You may also contact the Patient Navigator at [phone] should you have any questions or need assistance in obtaining follow up care.      Resources For Cancer Patients and their Caregivers ? American Cancer Society: Can assist with transportation, wigs, general needs, runs Look Good Feel Better.        985 511 1923 ? Cancer Care: Provides financial assistance, online support groups, medication/co-pay assistance.  1-800-813-HOPE 530 512 9830) ? [place] Assists [place] cancer  patients and their families through emotional , educational and financial support.  (832) 160-0224 ? [place] DSS Where to apply for food stamps, Medicaid and utility assistance. (346)269-4686 ? [facility]: Transportation to medical appointments. 520 759 1800 ? Social Security Administration: May apply for disability if have a Stage IV cancer. (905)558-8593 (670) 560-7271 ? [employer], Disability and Transit Services: Assists with nutrition, care and transit needs. [phone]

## 2016-01-12 NOTE — Progress Notes (Signed)
[facility]  Progress Note  No care team member to display  CHIEF COMPLAINTS:    Carcinoma of upper-outer quadrant of left female breast ([place])   11/30/2015 Surgery Lumpectomy and sentinel node procedure with Dr. Ladona Horns   11/30/2015 Surgery Needle localization of the L breast with mammo guidance   11/30/2015 Pathology Results L breast invasive ductal carcinoma, mod diff, 0.8 cm, focal DCIS, high grade, 2/2 LN negative, no LVI, pT1b, pN0M0, ER- PR- HER2 - Ki-67 92%    HISTORY OF PRESENTING ILLNESS:  Taylor Williams 65 y.o. female is here for follow-up of stage I triple negative carcinoma of the L breast. She has undergone definitive surgical therapy with Dr. Ladona Horns in [place].  Taylor Williams is accompanied by a female friend. Presents in treatment chair. She is here for Cycle #1 TC.   Admits she is scared about this first treatment. She took a xanax this morning. States "I am a complete control freak" and treatment really dawned on her when she spoke about starting chemotherapy at church last Sunday. Speaking about this out loud humbled her and made her realize how out of control she is in this process. She feels getting anxious about not being in control is a sign of weakness on her part. Later stating, "this will be a learning experience for me".   She reports diarrhea is her normal and already difficult to control with imodium. She has a tremendous gag reflex and spit up a bit while brushing her teeth this morning. This is her normal.  She denies abdominal pain.   She has not quit smoking. She notes that this will be challenging for her.   She has no further questions about chemotherapy today.    MEDICAL HISTORY:  Past Medical History  Diagnosis Date  . Depression   . Hypertension   . High cholesterol   . Anxiety     SURGICAL HISTORY: Past Surgical History  Procedure Laterality Date  . Breast lumpectomy Left     states for ductal carcinoma, had surgery in late may     SOCIAL HISTORY: Social History   Social History  . Marital Status: Widowed    Spouse Name: N/A  . Number of Children: N/A  . Years of Education: N/A   Occupational History  . Not on file.   Social History Main Topics  . Smoking status: Current Every Day Smoker -- 1.00 packs/day for 40 years  . Smokeless tobacco: Not on file  . Alcohol Use: No  . Drug Use: No  . Sexual Activity: Not on file   Other Topics Concern  . Not on file   Social History Narrative  Widowed for 40 years. Her husband was killed in an MVA. He was in the army and they were living in [place] at the time. No children. She never remarried. She has a close family, a close great nephew. Born in [place]. 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  Smoker cigarettes 1ppd for greater than 40 years.  No ETOH. Retired. Enjoys Haematologist, Barrister's clerk, volunteers at [employer].   FAMILY HISTORY: No family history on file.  Mother deceased at 50 from colon cancer Father deceased at 27 from complications of COPD, dementia One Brother healthy Maternal brother deceased from esophageal cancer Paternal grandmother had colon cancer and grandfather had lung cancer  ALLERGIES:  is allergic to aspirin; other; and fish allergy.  MEDICATIONS:  Current Outpatient Prescriptions  Medication  Sig Dispense Refill  . ALPRAZolam (XANAX) 0.25 MG tablet Take 0.25 mg by mouth 2 (two) times daily.    . cetirizine (ZYRTEC) 10 MG tablet Take 10 mg by mouth daily.    . citalopram (CELEXA) 20 MG tablet Take 20 mg by mouth daily.    . Cyclophosphamide (CYTOXAN IJ) Inject as directed. Has not started yet - to be given every 21 days x 4 cycles    . dexamethasone (DECADRON) 4 MG tablet The day before, day of, and day after chemo take 2 tabs in the am and 2 tabs in the pm. 48 tablet 0  . dexamethasone (DECADRON) 4 MG tablet The day before, day of, and day after chemo take 2 tabs in the am and 2 tabs in  the pm. 12 tablet 0  . DOCEtaxel (TAXOTERE IV) Inject into the vein. Has not started yet - to be given every 21 days x 4 cycles    . glipiZIDE (GLUCOTROL) 10 MG tablet Take 10 mg by mouth 2 (two) times daily before a meal.    . lidocaine-prilocaine (EMLA) cream Apply a quarter size amount to port site 1 hour prior to chemo. Do not rub in. Cover with plastic wrap. 30 g 3  . lisinopril-hydrochlorothiazide (PRINZIDE,ZESTORETIC) 20-12.5 MG tablet Take 1 tablet by mouth daily.    Marland Kitchen loperamide (IMODIUM A-D) 2 MG tablet Take 4 mg by mouth 2 (two) times daily.    . metFORMIN (GLUCOPHAGE) 1000 MG tablet Take 1,000 mg by mouth 2 (two) times daily with a meal.    . omega-3 acid ethyl esters (LOVAZA) 1 g capsule Take 1,500 mg by mouth 2 (two) times daily.    . ondansetron (ZOFRAN) 8 MG tablet Take 1 tablet (8 mg total) by mouth every 8 (eight) hours as needed for nausea or vomiting. 30 tablet 2  . Pegfilgrastim (NEULASTA DELIVERY KIT [place]) Inject into the skin. Has not started yet - to be given every 21 days x 4 cycles (after completing chemo)    . prochlorperazine (COMPAZINE) 10 MG tablet Take 1 tablet (10 mg total) by mouth every 6 (six) hours as needed for nausea or vomiting. 30 tablet 2  . sitaGLIPtin (JANUVIA) 100 MG tablet Take 100 mg by mouth daily.     No current facility-administered medications for this visit.   Facility-Administered Medications Ordered in Other Visits  Medication Dose Route Frequency Provider Last Rate Last Dose  . LORazepam (ATIVAN) injection 0.5 mg  0.5 mg Intravenous Once Patrici Ranks, MD       Review of Systems  Constitutional: Negative for fever, chills, weight loss and malaise/fatigue.  HENT: Negative for congestion, hearing loss, nosebleeds, sore throat and tinnitus.   Eyes: Negative for blurred vision, double vision, pain and discharge.  Respiratory: Negative for cough, hemoptysis, sputum production, shortness of breath and wheezing.   Cardiovascular: Negative for  chest pain, palpitations, claudication, leg swelling and PND.  Gastrointestinal: Negative for heartburn, nausea, vomiting, abdominal pain, diarrhea, constipation, blood in stool and melena.  Genitourinary: Negative for dysuria, urgency, frequency and hematuria.  Musculoskeletal: Negative for myalgias, joint pain and falls.  Skin: Negative for itching and rash.  Neurological: Negative for dizziness, tingling, tremors, sensory change, speech change, focal weakness, seizures, loss of consciousness, weakness and headaches.  Endo/Heme/Allergies: Does not bruise/bleed easily.  Psychiatric/Behavioral: Negative for depression, suicidal ideas, memory loss and substance abuse. The patient is not nervous/anxious and does not have insomnia.    14 point ROS was done  and is otherwise as detailed above or in HPI  PHYSICAL EXAMINATION: ECOG PERFORMANCE STATUS: 0 - Asymptomatic  Vitals with BMI 01/12/2016  Height   Weight 226 lbs 10 oz  BMI   Systolic 073  Diastolic 61  Pulse 58  Respirations 18    Physical Exam  Constitutional: She is oriented to person, place, and time and well-developed, well-nourished, and in no distress.  HENT:  Head: Normocephalic and atraumatic.  Nose: Nose normal.  Mouth/Throat: Oropharynx is clear and moist. No oropharyngeal exudate.  Eyes: Conjunctivae and EOM are normal. Pupils are equal, round, and reactive to light. Right eye exhibits no discharge. Left eye exhibits no discharge. No scleral icterus.  Neck: Normal range of motion. Neck supple. No tracheal deviation present. No thyromegaly present.  Cardiovascular: Normal rate, regular rhythm and normal heart sounds.  Exam reveals no gallop and no friction rub.   No murmur heard. Pulmonary/Chest: Effort normal and breath sounds normal. She has no wheezes. She has no rales.  Port a cath site C/D/I accessed  Abdominal: Soft. Bowel sounds are normal. She exhibits no distension and no mass. There is no tenderness. There is  no rebound and no guarding.  Musculoskeletal: Normal range of motion. She exhibits no edema.  Lymphadenopathy:    She has no cervical adenopathy.  Neurological: She is alert and oriented to person, place, and time. She has normal reflexes. No cranial nerve deficit. Gait normal. Coordination normal.  Skin: Skin is warm and dry. No rash noted.  Psychiatric: Mood, memory, affect and judgment normal.  Nursing note and vitals reviewed. Left breast examined with well healed breast and axillary incision sites. No erythema, no drainage. Good ROM of LUE. No lymphedema.  LABORATORY DATA:  I have reviewed the data as listed  Results for INOCENCIA, MURTAUGH (MRN [id number]) as of 02/10/2016 19:34  Ref. Range 01/12/2016 09:10  Sodium Latest Ref Range: 135-145 mmol/L 131 (L)  Potassium Latest Ref Range: 3.5-5.1 mmol/L 4.3  Chloride Latest Ref Range: 101-111 mmol/L 98 (L)  CO2 Latest Ref Range: 22-32 mmol/L 26  BUN Latest Ref Range: 6-20 mg/dL 13  Creatinine Latest Ref Range: 0.44-1.00 mg/dL 0.68  Calcium Latest Ref Range: 8.9-10.3 mg/dL 9.2  EGFR (Non-African Amer.) Latest Ref Range: >60 mL/min >60  EGFR (African American) Latest Ref Range: >60 mL/min >60  Glucose Latest Ref Range: 65-99 mg/dL 281 (H)  Anion gap Latest Ref Range: 5-15  7  Alkaline Phosphatase Latest Ref Range: 38-126 U/L 45  Albumin Latest Ref Range: 3.5-5.0 g/dL 3.4 (L)  AST Latest Ref Range: 15-41 U/L 23  ALT Latest Ref Range: 14-54 U/L 20  Total Protein Latest Ref Range: 6.5-8.1 g/dL 7.3  Total Bilirubin Latest Ref Range: 0.3-1.2 mg/dL 0.5  WBC Latest Ref Range: 4.0-10.5 K/uL 8.5  RBC Latest Ref Range: 3.87-5.11 MIL/uL 4.41  Hemoglobin Latest Ref Range: 12.0-15.0 g/dL 12.7  HCT Latest Ref Range: 36.0-46.0 % 37.4  MCV Latest Ref Range: 78.0-100.0 fL 84.8  MCH Latest Ref Range: 26.0-34.0 pg 28.8  MCHC Latest Ref Range: 30.0-36.0 g/dL 34.0  RDW Latest Ref Range: 11.5-15.5 % 14.9  Platelets Latest Ref Range: 150-400 K/uL 225   Neutrophils Latest Units: % 81  Lymphocytes Latest Units: % 12  Monocytes Relative Latest Units: % 6  Eosinophil Latest Units: % 0  Basophil Latest Units: % 0  NEUT# Latest Ref Range: 1.7-7.7 K/uL 6.9  Lymphocyte # Latest Ref Range: 0.7-4.0 K/uL 1.1  Monocyte # Latest Ref Range: 0.1-1.0 K/uL 0.5  Eosinophils Absolute Latest Ref Range: 0.0-0.7 K/uL 0.0  Basophils Absolute Latest Ref Range: 0.0-0.1 K/uL 0.0     RADIOGRAPHIC STUDIES: I have personally reviewed the radiological images as listed and agreed with the findings in the report. No results found.  ASSESSMENT & PLAN:  Stage I ER- PR- HER 2 neu - carcinoma of the L breast Lumpectomy and sentinel LN biopsy with Dr. Ladona Horns on 11/30/2015 Tobacco Abuse Port a cath placement with Dr. Ladona Horns   The patient is here for Cycle #1 TC. She was given Ativan with treatment today to help alleviate her current anxiety. She has baseline diarrhea and I instructed her on making sure to notify us if her baseline diarrhea worsens.  We discussed smoking cessation in detail. She has a significant smoking history (see HPI) I have ordered CT screening. She is up to date in regards to colonoscopy.   We reviewed the role of adjuvant XRT in the management of breast cancer after lumpectomy.  She understands that this will occur after completion of chemotherapy.  I have written her a prescription for phenergan. She already has zofran and compazine.   I spoke with the patient about what symptoms to look out for and to contact us if they occur, including uncontrolled diarrhea, nausea, and vomiting.   She will return for follow up next week.  All questions were answered. The patient knows to call the clinic with any problems, questions or concerns.  This note was electronically signed.   This document serves as a record of services personally performed by Ancil Linsey, MD. It was created on her behalf by Arlyce Harman, a trained medical scribe. The  creation of this record is based on the scribe's personal observations and the provider's statements to them. This document has been checked and approved by the attending provider.  I have reviewed the above documentation for accuracy and completeness and I agree with the above. Molli Hazard, MD  01/12/2016 9:20 AM

## 2016-01-15 NOTE — Progress Notes (Signed)
24 hours follow up- pt states that she has done well, just a little tires.  She had to take some alieve and that helped her.  Her blood sugars were elevated while she was taking her dexamethasone but have stabilized since.

## 2016-01-18 ENCOUNTER — Other Ambulatory Visit (HOSPITAL_COMMUNITY): Payer: Self-pay | Admitting: Oncology

## 2016-01-18 DIAGNOSIS — F17200 Nicotine dependence, unspecified, uncomplicated: Secondary | ICD-10-CM

## 2016-01-18 DIAGNOSIS — Z0000 Encounter for general adult medical examination without abnormal findings: Secondary | ICD-10-CM

## 2016-01-18 DIAGNOSIS — C50412 Malignant neoplasm of upper-outer quadrant of left female breast: Secondary | ICD-10-CM

## 2016-01-19 ENCOUNTER — Encounter (HOSPITAL_BASED_OUTPATIENT_CLINIC_OR_DEPARTMENT_OTHER): Payer: Medicare Other

## 2016-01-19 ENCOUNTER — Encounter (HOSPITAL_BASED_OUTPATIENT_CLINIC_OR_DEPARTMENT_OTHER): Payer: Medicare Other | Admitting: Oncology

## 2016-01-19 ENCOUNTER — Encounter (HOSPITAL_COMMUNITY): Payer: Self-pay | Admitting: Oncology

## 2016-01-19 VITALS — BP 97/50 | HR 67 | Temp 98.2°F | Resp 18

## 2016-01-19 VITALS — BP 84/42 | HR 82 | Temp 97.6°F | Resp 18 | Wt 217.0 lb

## 2016-01-19 DIAGNOSIS — C50412 Malignant neoplasm of upper-outer quadrant of left female breast: Secondary | ICD-10-CM

## 2016-01-19 DIAGNOSIS — F17210 Nicotine dependence, cigarettes, uncomplicated: Secondary | ICD-10-CM | POA: Diagnosis not present

## 2016-01-19 DIAGNOSIS — E876 Hypokalemia: Secondary | ICD-10-CM

## 2016-01-19 DIAGNOSIS — R197 Diarrhea, unspecified: Secondary | ICD-10-CM | POA: Diagnosis not present

## 2016-01-19 DIAGNOSIS — B370 Candidal stomatitis: Secondary | ICD-10-CM

## 2016-01-19 DIAGNOSIS — B3731 Acute candidiasis of vulva and vagina: Secondary | ICD-10-CM

## 2016-01-19 DIAGNOSIS — B373 Candidiasis of vulva and vagina: Secondary | ICD-10-CM

## 2016-01-19 DIAGNOSIS — I959 Hypotension, unspecified: Secondary | ICD-10-CM | POA: Diagnosis not present

## 2016-01-19 DIAGNOSIS — C50912 Malignant neoplasm of unspecified site of left female breast: Secondary | ICD-10-CM | POA: Diagnosis not present

## 2016-01-19 DIAGNOSIS — K121 Other forms of stomatitis: Secondary | ICD-10-CM

## 2016-01-19 DIAGNOSIS — F329 Major depressive disorder, single episode, unspecified: Secondary | ICD-10-CM | POA: Diagnosis not present

## 2016-01-19 DIAGNOSIS — I10 Essential (primary) hypertension: Secondary | ICD-10-CM | POA: Diagnosis not present

## 2016-01-19 DIAGNOSIS — Z9889 Other specified postprocedural states: Secondary | ICD-10-CM | POA: Diagnosis not present

## 2016-01-19 DIAGNOSIS — F419 Anxiety disorder, unspecified: Secondary | ICD-10-CM | POA: Diagnosis not present

## 2016-01-19 LAB — COMPREHENSIVE METABOLIC PANEL
ALBUMIN: 3.2 g/dL — AB (ref 3.5–5.0)
ALT: 20 U/L (ref 14–54)
ANION GAP: 10 (ref 5–15)
AST: 21 U/L (ref 15–41)
Alkaline Phosphatase: 43 U/L (ref 38–126)
BILIRUBIN TOTAL: 1 mg/dL (ref 0.3–1.2)
BUN: 20 mg/dL (ref 6–20)
CO2: 24 mmol/L (ref 22–32)
Calcium: 8.5 mg/dL — ABNORMAL LOW (ref 8.9–10.3)
Chloride: 92 mmol/L — ABNORMAL LOW (ref 101–111)
Creatinine, Ser: 1.05 mg/dL — ABNORMAL HIGH (ref 0.44–1.00)
GFR calc Af Amer: >60 mL/min (ref >60)
GFR calc non Af Amer: 55 mL/min — ABNORMAL LOW (ref >60)
GLUCOSE: 147 mg/dL — AB (ref 65–99)
POTASSIUM: 3.2 mmol/L — AB (ref 3.5–5.1)
SODIUM: 126 mmol/L — AB (ref 135–145)
TOTAL PROTEIN: 6.8 g/dL (ref 6.5–8.1)

## 2016-01-19 LAB — CBC WITH DIFFERENTIAL/PLATELET
BASOS ABS: 0.1 10*3/uL (ref 0.0–0.1)
Basophils Relative: 2 %
EOS ABS: 0 10*3/uL (ref 0.0–0.7)
Eosinophils Relative: 0 %
HEMATOCRIT: 38.8 % (ref 36.0–46.0)
Hemoglobin: 13.7 g/dL (ref 12.0–15.0)
LYMPHS ABS: 1.2 10*3/uL (ref 0.7–4.0)
Lymphocytes Relative: 20 %
MCH: 29.1 pg (ref 26.0–34.0)
MCHC: 35.3 g/dL (ref 30.0–36.0)
MCV: 82.6 fL (ref 78.0–100.0)
Monocytes Absolute: 2.3 10*3/uL — ABNORMAL HIGH (ref 0.1–1.0)
Monocytes Relative: 37 %
NEUTROS ABS: 2.5 10*3/uL (ref 1.7–7.7)
Neutrophils Relative %: 41 %
Platelets: 193 10*3/uL (ref 150–400)
RBC: 4.7 MIL/uL (ref 3.87–5.11)
RDW: 14.2 % (ref 11.5–15.5)
WBC: 6.1 10*3/uL (ref 4.0–10.5)

## 2016-01-19 MED ORDER — FIRST-DUKES MOUTHWASH MT SUSP: 5.0000 mL | Freq: Four times a day (QID) | OROMUCOSAL | Status: DC | PRN

## 2016-01-19 MED ORDER — HEPARIN SOD (PORK) LOCK FLUSH 100 UNIT/ML IV SOLN
500.0000 [IU] | Freq: Once | INTRAVENOUS | Status: AC
  Administered 2016-01-19: 500 [IU] via INTRAVENOUS
  Filled 2016-01-19: qty 5

## 2016-01-19 MED ORDER — FLUCONAZOLE 100 MG PO TABS: 100.0000 mg | ORAL_TABLET | Freq: Every day | ORAL | Status: DC

## 2016-01-19 MED ORDER — SODIUM CHLORIDE 0.9 % IV SOLN
Freq: Once | INTRAVENOUS | Status: AC
  Administered 2016-01-19: 13:00:00 via INTRAVENOUS
  Filled 2016-01-19: qty 4

## 2016-01-19 MED ORDER — LOPERAMIDE HCL 2 MG PO CAPS
ORAL_CAPSULE | ORAL | Status: AC
  Filled 2016-01-19: qty 2

## 2016-01-19 MED ORDER — POTASSIUM CHLORIDE CRYS ER 20 MEQ PO TBCR: 40.0000 meq | EXTENDED_RELEASE_TABLET | Freq: Every day | ORAL | Status: DC

## 2016-01-19 MED ORDER — SODIUM CHLORIDE 0.9 % IV SOLN
INTRAVENOUS | Status: DC
  Administered 2016-01-19: 13:00:00 via INTRAVENOUS

## 2016-01-19 MED ORDER — LOPERAMIDE HCL 2 MG PO CAPS
4.0000 mg | ORAL_CAPSULE | Freq: Once | ORAL | Status: AC
  Administered 2016-01-19: 4 mg via ORAL

## 2016-01-19 MED ORDER — DIPHENOXYLATE-ATROPINE 2.5-0.025 MG PO TABS: ORAL_TABLET | ORAL | Status: DC

## 2016-01-19 NOTE — Progress Notes (Signed)
Tolerated infusion without any problems.

## 2016-01-19 NOTE — Assessment & Plan Note (Addendum)
Stage IA (T1BN0M0) left invasive breast cancer, triple negative, 0.8 cm in size, S/P left lumpectomy by Dr. Ladona Horns on 11/30/2015. TC adjuvant chemotherapy began on 01/12/2016.  Oncology history updated.  Staging in [facility] problem list is completed.  Labs today for NADIR check: CBC diff, CMET.  I personally reviewed and went over laboratory results with the patient.  The results are noted within this dictation.  Renal function changes and hypokalemia are concerning for dehydration secondary to diarrhea.  Hypokalemia will be replaced with PO replacement therapy.  Kdur 40 mEq per day is escribed to her pharmacy x 2 weeks.    Hypotension is noted today at 84/42.  Her baseline BP is ~130/55.  This too is likely secondary to dehydration.  Taylor Williams denies any symptoms of hypotension.  I have asked her to hold her anti-hypertensive for the time being.  I have order IVF NS today with Dex and Zofran today.    She is provided proper education regarding Imodium dosing.  She will take 2 tablets after her 1st loose BM and then 1 tablet every 2 hours thereafter until no loose BM x 12 hours.  She is also given a Rx for Lomotil.  I have prescribed Diflucan 100 mg daily x 5 for vaginal candida.  I have also prescribed Duke Mouthwash as well for symptomatic management of mouth irritation without exam findings suspicious for thrush.    She will call with any issues moving forward.  I will discuss the patient's complications with her first cycle of chemotherapy with Dr. Whitney Muse.  She did experience a grade 2 toxicity with diarrhea.  Taxotere PI recommends dose modification for grade 3 toxicity.  Return in 2 weeks for next cycle of chemotherapy and follow-up.

## 2016-01-19 NOTE — Progress Notes (Signed)
No primary care provider on file. No primary provider on file.  Carcinoma of upper-outer quadrant of left female breast ([place])  Vaginal candidiasis - Plan: fluconazole (DIFLUCAN) 100 MG tablet  Stomatitis - Plan: Diphenhyd-Hydrocort-Nystatin (FIRST-DUKES MOUTHWASH) SUSP  Hypokalemia - Plan: potassium chloride SA (K-DUR,KLOR-CON) 20 MEQ tablet  CURRENT THERAPY: TC beginning on 01/12/2016  INTERVAL HISTORY: Taylor Williams 65 y.o. female returns for followup of Stage IA (T1BN0M0) left invasive breast cancer, triple negative, 0.8 cm in size, S/P left lumpectomy by Dr. Ladona Horns on 11/30/2015.    Carcinoma of upper-outer quadrant of left female breast ([place])   11/30/2015 Surgery Lumpectomy and sentinel node procedure with Dr. Ladona Horns   11/30/2015 Surgery Needle localization of the L breast with mammo guidance   11/30/2015 Pathology Results L breast invasive ductal carcinoma, mod diff, 0.8 cm, focal DCIS, high grade, 2/2 LN negative, no LVI, pT1b, pN0M0, ER- PR- HER2 - Ki-67 92%   01/12/2016 -  Chemotherapy TC x 4 cycles   She has a few complaints today at her nadir check: 1. Increased fatigue 2. Diarrhea, 5-6 stools per day, watery, beginning 24-28 hours ago.  No worse today.  Stable.  No blood or mucous noted.  Using imodium without significant improvement. 3. Feelings of uselessness- secondary to fatigiue 4. Headache- she notes that it feels similar to when she had a left trigeminal nerve herpes zoster.  No blisters or sores noted.   5. Concern regarding vaginal candida.  She reports that her glucose is in the 150's at home.    On further exam, she is notes to have mild erythema of her hands and right palmar blister.  She denies any tenderness.  She notes that the erythema is at her baseline.  She notes that the blister is secondary to trauma from pulling weeds the other day.  Again, she denies any tenderness.  Review of Systems  Constitutional: Negative for fever and chills.  HENT:  Negative.   Eyes: Negative.   Respiratory: Negative.   Cardiovascular: Negative.   Gastrointestinal: Positive for diarrhea (5-6 loose stools per day x 24-48 hours). Negative for nausea, vomiting, constipation, blood in stool and melena.  Genitourinary: Negative.        Vaginal tenderness  Musculoskeletal: Negative.   Skin: Negative.   Neurological: Negative for dizziness.  Endo/Heme/Allergies: Negative.   Psychiatric/Behavioral: Negative.     Past Medical History  Diagnosis Date  . Depression   . Hypertension   . High cholesterol   . Anxiety     Past Surgical History  Procedure Laterality Date  . Breast lumpectomy Left     states for ductal carcinoma, had surgery in late may    History reviewed. No pertinent family history.  Social History   Social History  . Marital Status: Widowed    Spouse Name: N/A  . Number of Children: N/A  . Years of Education: N/A   Social History Main Topics  . Smoking status: Current Every Day Smoker -- 1.00 packs/day for 40 years  . Smokeless tobacco: None  . Alcohol Use: No  . Drug Use: No  . Sexual Activity: Not Asked   Other Topics Concern  . None   Social History Narrative     PHYSICAL EXAMINATION  ECOG PERFORMANCE STATUS: 1 - Symptomatic but completely ambulatory  Filed Vitals:   01/19/16 1204 01/19/16 1210  BP: 80/42 84/42  Pulse: 82   Temp: 97.6 F (36.4 C)   Resp: 18  GENERAL:alert, no distress, well nourished, well developed, comfortable, cooperative, obese, smiling and accompanied by her friend SKIN: skin color, texture, turgor are normal, no rashes or significant lesions HEAD: Normocephalic, No masses, lesions, tenderness or abnormalities EYES: normal, EOMI, Conjunctiva are pink and non-injected EARS: External ears normal OROPHARYNX:no exudate, no erythema, lips, buccal mucosa, and tongue normal and mucous membranes are moist  NECK: supple, trachea midline LYMPH:  not examined BREAST:not examined LUNGS:  clear to auscultation and percussion HEART: regular rate & rhythm ABDOMEN:abdomen soft, obese and normal bowel sounds BACK: Back symmetric, no curvature. EXTREMITIES:less then 2 second capillary refill, no joint deformities, effusion, or inflammation, no skin discoloration  NEURO: alert & oriented x 3 with fluent speech, no focal motor/sensory deficits, in wheelchair   LABORATORY DATA: CBC    Component Value Date/Time   WBC 6.1 01/19/2016 1302   RBC 4.70 01/19/2016 1302   HGB 13.7 01/19/2016 1302   HCT 38.8 01/19/2016 1302   PLT 193 01/19/2016 1302   MCV 82.6 01/19/2016 1302   MCH 29.1 01/19/2016 1302   MCHC 35.3 01/19/2016 1302   RDW 14.2 01/19/2016 1302   LYMPHSABS 1.2 01/19/2016 1302   MONOABS 2.3* 01/19/2016 1302   EOSABS 0.0 01/19/2016 1302   BASOSABS 0.1 01/19/2016 1302      Chemistry      Component Value Date/Time   NA 126* 01/19/2016 1302   K 3.2* 01/19/2016 1302   CL 92* 01/19/2016 1302   CO2 24 01/19/2016 1302   BUN 20 01/19/2016 1302   CREATININE 1.05* 01/19/2016 1302      Component Value Date/Time   CALCIUM 8.5* 01/19/2016 1302   ALKPHOS 43 01/19/2016 1302   AST 21 01/19/2016 1302   ALT 20 01/19/2016 1302   BILITOT 1.0 01/19/2016 1302        PENDING LABS:   RADIOGRAPHIC STUDIES:  No results found.   PATHOLOGY:    ASSESSMENT AND PLAN:  Carcinoma of upper-outer quadrant of left female breast ([name]) Stage IA (T1BN0M0) left invasive breast cancer, triple negative, 0.8 cm in size, S/P left lumpectomy by Dr. Ladona Horns on 11/30/2015. TC adjuvant chemotherapy began on 01/12/2016.  Oncology history updated.  Staging in [facility] problem list is completed.  Labs today for NADIR check: CBC diff, CMET.  I personally reviewed and went over laboratory results with the patient.  The results are noted within this dictation.  Renal function changes and hypokalemia are concerning for dehydration secondary to diarrhea.  Hypokalemia will be replaced with PO replacement  therapy.  Kdur 40 mEq per day is escribed to her pharmacy x 2 weeks.    Hypotension is noted today at 84/42.  Her baseline BP is ~130/55.  This too is likely secondary to dehydration.  Trinidee denies any symptoms of hypotension.  I have asked her to hold her anti-hypertensive for the time being.  I have order IVF NS today with Dex and Zofran today.    She is provided proper education regarding Imodium dosing.  She will take 2 tablets after her 1st loose BM and then 1 tablet every 2 hours thereafter until no loose BM x 12 hours.  She is also given a Rx for Lomotil.  I have prescribed Diflucan 100 mg daily x 5 for vaginal candida.  I have also prescribed Duke Mouthwash as well for symptomatic management of mouth irritation without exam findings suspicious for thrush.    She will call with any issues moving forward.  I will discuss the patient's complications  with her first cycle of chemotherapy with Dr. Whitney Muse.  She did experience a grade 2 toxicity with diarrhea.  Taxotere PI recommends dose modification for grade 3 toxicity.  Return in 2 weeks for next cycle of chemotherapy and follow-up.    ORDERS PLACED FOR THIS ENCOUNTER: No orders of the defined types were placed in this encounter.    MEDICATIONS PRESCRIBED THIS ENCOUNTER: Meds ordered this encounter  Medications  . fluconazole (DIFLUCAN) 100 MG tablet    Sig: Take 1 tablet (100 mg total) by mouth daily.    Dispense:  5 tablet    Refill:  1    Order Specific Question:  Supervising Provider    Answer:  Patrici Ranks [id number]  . Diphenhyd-Hydrocort-Nystatin (FIRST-DUKES MOUTHWASH) SUSP    Sig: Use as directed 5 mLs in the mouth or throat 4 (four) times daily as needed.    Dispense:  420 mL    Refill:  2    Order Specific Question:  Supervising Provider    Answer:  Patrici Ranks [id number]  . potassium chloride SA (K-DUR,KLOR-CON) 20 MEQ tablet    Sig: Take 2 tablets (40 mEq total) by mouth daily.    Dispense:  60  tablet    Refill:  0    Order Specific Question:  Supervising Provider    Answer:  Patrici Ranks [id number]    THERAPY PLAN:  Continue with treatment as planned.  All questions were answered. The patient knows to call the clinic with any problems, questions or concerns. We can certainly see the patient much sooner if necessary.  Patient and plan discussed with Dr. Ancil Linsey and she is in agreement with the aforementioned.   This note is electronically signed by: Doy Mince 01/19/2016 10:17 PM

## 2016-01-23 DIAGNOSIS — E1165 Type 2 diabetes mellitus with hyperglycemia: Secondary | ICD-10-CM | POA: Diagnosis not present

## 2016-01-23 DIAGNOSIS — I10 Essential (primary) hypertension: Secondary | ICD-10-CM | POA: Diagnosis not present

## 2016-01-24 DIAGNOSIS — I10 Essential (primary) hypertension: Secondary | ICD-10-CM | POA: Diagnosis not present

## 2016-01-24 DIAGNOSIS — E1165 Type 2 diabetes mellitus with hyperglycemia: Secondary | ICD-10-CM | POA: Diagnosis not present

## 2016-02-01 ENCOUNTER — Ambulatory Visit (HOSPITAL_COMMUNITY)
Admission: RE | Admit: 2016-02-01 | Discharge: 2016-02-01 | Disposition: A | Discharge status: Home / Self Care | Payer: Medicare Other | Source: Ambulatory Visit | Attending: Oncology | Admitting: Oncology

## 2016-02-01 DIAGNOSIS — F17200 Nicotine dependence, unspecified, uncomplicated: Secondary | ICD-10-CM | POA: Diagnosis present

## 2016-02-01 DIAGNOSIS — F17210 Nicotine dependence, cigarettes, uncomplicated: Secondary | ICD-10-CM | POA: Insufficient documentation

## 2016-02-01 DIAGNOSIS — I700 Atherosclerosis of aorta: Secondary | ICD-10-CM | POA: Diagnosis not present

## 2016-02-01 DIAGNOSIS — Z0000 Encounter for general adult medical examination without abnormal findings: Secondary | ICD-10-CM

## 2016-02-01 DIAGNOSIS — R918 Other nonspecific abnormal finding of lung field: Secondary | ICD-10-CM | POA: Diagnosis not present

## 2016-02-01 DIAGNOSIS — C50412 Malignant neoplasm of upper-outer quadrant of left female breast: Secondary | ICD-10-CM | POA: Diagnosis not present

## 2016-02-01 DIAGNOSIS — I2510 Atherosclerotic heart disease of native coronary artery without angina pectoris: Secondary | ICD-10-CM | POA: Insufficient documentation

## 2016-02-02 ENCOUNTER — Inpatient Hospital Stay (HOSPITAL_COMMUNITY): Payer: Medicare Other

## 2016-02-02 ENCOUNTER — Encounter (HOSPITAL_COMMUNITY): Payer: Self-pay

## 2016-02-02 ENCOUNTER — Other Ambulatory Visit (HOSPITAL_COMMUNITY): Payer: Self-pay | Admitting: *Deleted

## 2016-02-02 ENCOUNTER — Encounter (HOSPITAL_COMMUNITY): Payer: Medicare Other | Attending: Hematology & Oncology

## 2016-02-02 VITALS — BP 136/61 | HR 64 | Temp 97.5°F | Resp 18 | Wt 234.6 lb

## 2016-02-02 DIAGNOSIS — F17210 Nicotine dependence, cigarettes, uncomplicated: Secondary | ICD-10-CM | POA: Diagnosis not present

## 2016-02-02 DIAGNOSIS — F419 Anxiety disorder, unspecified: Secondary | ICD-10-CM | POA: Insufficient documentation

## 2016-02-02 DIAGNOSIS — Z9889 Other specified postprocedural states: Secondary | ICD-10-CM | POA: Diagnosis not present

## 2016-02-02 DIAGNOSIS — C50912 Malignant neoplasm of unspecified site of left female breast: Secondary | ICD-10-CM | POA: Diagnosis not present

## 2016-02-02 DIAGNOSIS — I10 Essential (primary) hypertension: Secondary | ICD-10-CM | POA: Insufficient documentation

## 2016-02-02 DIAGNOSIS — Z5111 Encounter for antineoplastic chemotherapy: Secondary | ICD-10-CM | POA: Diagnosis present

## 2016-02-02 DIAGNOSIS — F329 Major depressive disorder, single episode, unspecified: Secondary | ICD-10-CM | POA: Diagnosis not present

## 2016-02-02 DIAGNOSIS — C50412 Malignant neoplasm of upper-outer quadrant of left female breast: Secondary | ICD-10-CM

## 2016-02-02 DIAGNOSIS — Z79899 Other long term (current) drug therapy: Secondary | ICD-10-CM | POA: Diagnosis not present

## 2016-02-02 DIAGNOSIS — R7309 Other abnormal glucose: Secondary | ICD-10-CM | POA: Diagnosis not present

## 2016-02-02 DIAGNOSIS — Z7984 Long term (current) use of oral hypoglycemic drugs: Secondary | ICD-10-CM | POA: Diagnosis not present

## 2016-02-02 DIAGNOSIS — Z5189 Encounter for other specified aftercare: Secondary | ICD-10-CM | POA: Diagnosis not present

## 2016-02-02 DIAGNOSIS — R739 Hyperglycemia, unspecified: Secondary | ICD-10-CM | POA: Insufficient documentation

## 2016-02-02 LAB — CBC WITH DIFFERENTIAL/PLATELET
BASOS ABS: 0 10*3/uL (ref 0.0–0.1)
BASOS PCT: 0 %
EOS PCT: 0 %
Eosinophils Absolute: 0 10*3/uL (ref 0.0–0.7)
HCT: 34.4 % — ABNORMAL LOW (ref 36.0–46.0)
Hemoglobin: 11.5 g/dL — ABNORMAL LOW (ref 12.0–15.0)
Lymphocytes Relative: 5 %
Lymphs Abs: 0.7 10*3/uL (ref 0.7–4.0)
MCH: 28.6 pg (ref 26.0–34.0)
MCHC: 33.4 g/dL (ref 30.0–36.0)
MCV: 85.6 fL (ref 78.0–100.0)
MONO ABS: 0.2 10*3/uL (ref 0.1–1.0)
Monocytes Relative: 2 %
NEUTROS ABS: 11.3 10*3/uL — AB (ref 1.7–7.7)
Neutrophils Relative %: 93 %
PLATELETS: 261 10*3/uL (ref 150–400)
RBC: 4.02 MIL/uL (ref 3.87–5.11)
RDW: 15.4 % (ref 11.5–15.5)
WBC: 12.2 10*3/uL — AB (ref 4.0–10.5)

## 2016-02-02 LAB — COMPREHENSIVE METABOLIC PANEL
ALT: 29 U/L (ref 14–54)
ANION GAP: 4 — AB (ref 5–15)
AST: 23 U/L (ref 15–41)
Albumin: 2.8 g/dL — ABNORMAL LOW (ref 3.5–5.0)
Alkaline Phosphatase: 69 U/L (ref 38–126)
BUN: 13 mg/dL (ref 6–20)
CHLORIDE: 100 mmol/L — AB (ref 101–111)
CO2: 24 mmol/L (ref 22–32)
Calcium: 8.6 mg/dL — ABNORMAL LOW (ref 8.9–10.3)
Creatinine, Ser: 0.63 mg/dL (ref 0.44–1.00)
GLUCOSE: 298 mg/dL — AB (ref 65–99)
POTASSIUM: 5.4 mmol/L — AB (ref 3.5–5.1)
SODIUM: 128 mmol/L — AB (ref 135–145)
Total Bilirubin: 0.4 mg/dL (ref 0.3–1.2)
Total Protein: 6.5 g/dL (ref 6.5–8.1)

## 2016-02-02 MED ORDER — SODIUM CHLORIDE 0.9% FLUSH: 10.0000 mL | INTRAVENOUS | Status: DC | PRN

## 2016-02-02 MED ORDER — PEGFILGRASTIM INJECTION 6 MG/0.6ML ~~LOC~~: 6.0000 mg | PREFILLED_SYRINGE | Freq: Once | SUBCUTANEOUS | Status: DC

## 2016-02-02 MED ORDER — INSULIN ASPART 100 UNIT/ML ~~LOC~~ SOLN
6.0000 [IU] | Freq: Once | SUBCUTANEOUS | Status: AC
  Administered 2016-02-02: 6 [IU] via SUBCUTANEOUS
  Filled 2016-02-02: qty 0.06

## 2016-02-02 MED ORDER — SODIUM CHLORIDE 0.9 % IV SOLN
600.0000 mg/m2 | Freq: Once | INTRAVENOUS | Status: AC
  Administered 2016-02-02: 1320 mg via INTRAVENOUS
  Filled 2016-02-02: qty 50

## 2016-02-02 MED ORDER — PALONOSETRON HCL INJECTION 0.25 MG/5ML
0.2500 mg | Freq: Once | INTRAVENOUS | Status: AC
  Administered 2016-02-02: 0.25 mg via INTRAVENOUS
  Filled 2016-02-02: qty 5

## 2016-02-02 MED ORDER — SODIUM CHLORIDE 0.9 % IV SOLN
Freq: Once | INTRAVENOUS | Status: AC
  Administered 2016-02-02: 10:00:00 via INTRAVENOUS

## 2016-02-02 MED ORDER — PEGFILGRASTIM 6 MG/0.6ML ~~LOC~~ PSKT
PREFILLED_SYRINGE | SUBCUTANEOUS | Status: AC
  Filled 2016-02-02: qty 0.6

## 2016-02-02 MED ORDER — SODIUM CHLORIDE 0.9 % IV SOLN
10.0000 mg | Freq: Once | INTRAVENOUS | Status: AC
  Administered 2016-02-02: 10 mg via INTRAVENOUS
  Filled 2016-02-02: qty 1

## 2016-02-02 MED ORDER — DOCETAXEL CHEMO INJECTION 160 MG/16ML
67.0000 mg/m2 | Freq: Once | INTRAVENOUS | Status: AC
  Administered 2016-02-02: 150 mg via INTRAVENOUS
  Filled 2016-02-02: qty 15

## 2016-02-02 MED ORDER — HEPARIN SOD (PORK) LOCK FLUSH 100 UNIT/ML IV SOLN
500.0000 [IU] | Freq: Once | INTRAVENOUS | Status: AC | PRN
  Administered 2016-02-02: 500 [IU]
  Filled 2016-02-02: qty 5

## 2016-02-02 MED ORDER — INSULIN LISPRO 100 UNIT/ML ~~LOC~~ SOLN: SUBCUTANEOUS | Status: DC

## 2016-02-02 MED ORDER — PEGFILGRASTIM 6 MG/0.6ML ~~LOC~~ PSKT
6.0000 mg | PREFILLED_SYRINGE | Freq: Once | SUBCUTANEOUS | Status: AC
  Administered 2016-02-02: 6 mg via SUBCUTANEOUS

## 2016-02-02 NOTE — Progress Notes (Signed)
Taylor Williams Tolerated chemotherapy well today Discharged ambulatory   .Taylor Williams today for neulasta OBI placement per MD orders. OBI device filled per protocol and placed on right Upper Arm. Needle/catheter placement noted prior to patient leaving. Tolerated without incident and aware of injection to be delivered in  27 hours.

## 2016-02-02 NOTE — Patient Instructions (Addendum)
[facility] Discharge Instructions for Patients Receiving Chemotherapy   Beginning January 23rd 2017 lab work for the [facility] will be done in the  Main lab at [facility] on 1st floor. If you have a lab appointment with the [facility] please come in thru the  Main Entrance and check in at the main information desk   Today you received the following chemotherapy agents taxotere and carbo neulasta on pro placed  taxotere reduced by 10% to see if it helps with your diarrhea Please call us if you have uncontrollable diarrhea next week Insulin given today Insulin called into your pharmacy today to use at home for sliding scale insulin Stop taking your potassium right now it was high   To help prevent nausea and vomiting after your treatment, we encourage you to take your nausea medication If you develop nausea and vomiting, or diarrhea that is not controlled by your medication, call the clinic.  The clinic phone number is [phone]. Office hours are Monday-Friday 8:30am-5:00pm.  BELOW ARE SYMPTOMS THAT SHOULD BE REPORTED IMMEDIATELY:  *FEVER GREATER THAN 101.0 F  *CHILLS WITH OR WITHOUT FEVER  NAUSEA AND VOMITING THAT IS NOT CONTROLLED WITH YOUR NAUSEA MEDICATION  *UNUSUAL SHORTNESS OF BREATH  *UNUSUAL BRUISING OR BLEEDING  TENDERNESS IN MOUTH AND THROAT WITH OR WITHOUT PRESENCE OF ULCERS  *URINARY PROBLEMS  *BOWEL PROBLEMS  UNUSUAL RASH Items with * indicate a potential emergency and should be followed up as soon as possible. If you have an emergency after office hours please contact your primary care physician or go to the nearest emergency department.  Please call the clinic during office hours if you have any questions or concerns.   You may also contact the Patient Navigator at [phone] should you have any questions or need assistance in obtaining follow up care.      Resources For Cancer Patients and their Caregivers ? American  Cancer Society: Can assist with transportation, wigs, general needs, runs Look Good Feel Better.        843-884-1419 ? Cancer Care: Provides financial assistance, online support groups, medication/co-pay assistance.  1-800-813-HOPE 347-777-9529) ? [place] Assists [place] cancer patients and their families through emotional , educational and financial support.  506-166-4287 ? [place] DSS Where to apply for food stamps, Medicaid and utility assistance. ([phone] ? RCATS: Transportation to medical appointments. [phone] ? Social Security Administration: May apply for disability if have a Stage IV cancer. [phone] [phone] ? [employer], Disability and Transit Services: Assists with nutrition, care and transit needs. [phone]

## 2016-02-10 ENCOUNTER — Encounter (HOSPITAL_COMMUNITY): Payer: Self-pay | Admitting: Hematology & Oncology

## 2016-02-23 ENCOUNTER — Inpatient Hospital Stay (HOSPITAL_COMMUNITY): Payer: Medicare Other

## 2016-02-23 ENCOUNTER — Encounter (HOSPITAL_BASED_OUTPATIENT_CLINIC_OR_DEPARTMENT_OTHER): Payer: Medicare Other | Admitting: Oncology

## 2016-02-23 ENCOUNTER — Encounter (HOSPITAL_BASED_OUTPATIENT_CLINIC_OR_DEPARTMENT_OTHER): Payer: Medicare Other

## 2016-02-23 ENCOUNTER — Encounter (HOSPITAL_COMMUNITY): Payer: Self-pay | Admitting: Lab

## 2016-02-23 VITALS — BP 130/66 | HR 75 | Temp 97.7°F | Resp 18 | Wt 226.0 lb

## 2016-02-23 VITALS — BP 122/70 | HR 72 | Temp 98.1°F | Resp 18

## 2016-02-23 DIAGNOSIS — Z720 Tobacco use: Secondary | ICD-10-CM | POA: Diagnosis not present

## 2016-02-23 DIAGNOSIS — I10 Essential (primary) hypertension: Secondary | ICD-10-CM | POA: Diagnosis not present

## 2016-02-23 DIAGNOSIS — Z171 Estrogen receptor negative status [ER-]: Secondary | ICD-10-CM | POA: Diagnosis not present

## 2016-02-23 DIAGNOSIS — Z5189 Encounter for other specified aftercare: Secondary | ICD-10-CM

## 2016-02-23 DIAGNOSIS — R918 Other nonspecific abnormal finding of lung field: Secondary | ICD-10-CM

## 2016-02-23 DIAGNOSIS — Z9889 Other specified postprocedural states: Secondary | ICD-10-CM | POA: Diagnosis not present

## 2016-02-23 DIAGNOSIS — C50412 Malignant neoplasm of upper-outer quadrant of left female breast: Secondary | ICD-10-CM

## 2016-02-23 DIAGNOSIS — R600 Localized edema: Secondary | ICD-10-CM

## 2016-02-23 DIAGNOSIS — F419 Anxiety disorder, unspecified: Secondary | ICD-10-CM | POA: Diagnosis not present

## 2016-02-23 DIAGNOSIS — Z5111 Encounter for antineoplastic chemotherapy: Secondary | ICD-10-CM

## 2016-02-23 DIAGNOSIS — F329 Major depressive disorder, single episode, unspecified: Secondary | ICD-10-CM | POA: Diagnosis not present

## 2016-02-23 DIAGNOSIS — R110 Nausea: Secondary | ICD-10-CM

## 2016-02-23 DIAGNOSIS — C50912 Malignant neoplasm of unspecified site of left female breast: Secondary | ICD-10-CM | POA: Diagnosis not present

## 2016-02-23 DIAGNOSIS — R911 Solitary pulmonary nodule: Secondary | ICD-10-CM

## 2016-02-23 DIAGNOSIS — F17210 Nicotine dependence, cigarettes, uncomplicated: Secondary | ICD-10-CM | POA: Diagnosis not present

## 2016-02-23 LAB — COMPREHENSIVE METABOLIC PANEL
ALT: 20 U/L (ref 14–54)
ANION GAP: 8 (ref 5–15)
AST: 21 U/L (ref 15–41)
Albumin: 2.9 g/dL — ABNORMAL LOW (ref 3.5–5.0)
Alkaline Phosphatase: 60 U/L (ref 38–126)
BILIRUBIN TOTAL: 0.4 mg/dL (ref 0.3–1.2)
BUN: 9 mg/dL (ref 6–20)
CHLORIDE: 101 mmol/L (ref 101–111)
CO2: 22 mmol/L (ref 22–32)
Calcium: 8.5 mg/dL — ABNORMAL LOW (ref 8.9–10.3)
Creatinine, Ser: 0.54 mg/dL (ref 0.44–1.00)
Glucose, Bld: 263 mg/dL — ABNORMAL HIGH (ref 65–99)
Potassium: 4.1 mmol/L (ref 3.5–5.1)
Sodium: 131 mmol/L — ABNORMAL LOW (ref 135–145)
TOTAL PROTEIN: 6.1 g/dL — AB (ref 6.5–8.1)

## 2016-02-23 LAB — CBC WITH DIFFERENTIAL/PLATELET
BASOS ABS: 0 10*3/uL (ref 0.0–0.1)
Basophils Relative: 0 %
EOS PCT: 0 %
Eosinophils Absolute: 0 10*3/uL (ref 0.0–0.7)
HEMATOCRIT: 33.2 % — AB (ref 36.0–46.0)
Hemoglobin: 11.5 g/dL — ABNORMAL LOW (ref 12.0–15.0)
LYMPHS PCT: 8 %
Lymphs Abs: 0.8 10*3/uL (ref 0.7–4.0)
MCH: 30 pg (ref 26.0–34.0)
MCHC: 34.6 g/dL (ref 30.0–36.0)
MCV: 86.7 fL (ref 78.0–100.0)
MONO ABS: 0.5 10*3/uL (ref 0.1–1.0)
MONOS PCT: 5 %
NEUTROS ABS: 9.4 10*3/uL — AB (ref 1.7–7.7)
Neutrophils Relative %: 87 %
PLATELETS: 252 10*3/uL (ref 150–400)
RBC: 3.83 MIL/uL — ABNORMAL LOW (ref 3.87–5.11)
RDW: 17.9 % — AB (ref 11.5–15.5)
WBC: 10.8 10*3/uL — ABNORMAL HIGH (ref 4.0–10.5)

## 2016-02-23 MED ORDER — PEGFILGRASTIM 6 MG/0.6ML ~~LOC~~ PSKT
6.0000 mg | PREFILLED_SYRINGE | Freq: Once | SUBCUTANEOUS | Status: AC
  Administered 2016-02-23: 6 mg via SUBCUTANEOUS

## 2016-02-23 MED ORDER — DOCETAXEL CHEMO INJECTION 160 MG/16ML
67.0000 mg/m2 | Freq: Once | INTRAVENOUS | Status: AC
  Administered 2016-02-23: 150 mg via INTRAVENOUS
  Filled 2016-02-23: qty 8

## 2016-02-23 MED ORDER — PALONOSETRON HCL INJECTION 0.25 MG/5ML
0.2500 mg | Freq: Once | INTRAVENOUS | Status: AC
  Administered 2016-02-23: 0.25 mg via INTRAVENOUS
  Filled 2016-02-23: qty 5

## 2016-02-23 MED ORDER — SODIUM CHLORIDE 0.9 % IV SOLN
10.0000 mg | Freq: Once | INTRAVENOUS | Status: AC
  Administered 2016-02-23: 10 mg via INTRAVENOUS
  Filled 2016-02-23: qty 1

## 2016-02-23 MED ORDER — SODIUM CHLORIDE 0.9% FLUSH
10.0000 mL | INTRAVENOUS | Status: DC | PRN
  Administered 2016-02-23: 10 mL
  Filled 2016-02-23: qty 10

## 2016-02-23 MED ORDER — HEPARIN SOD (PORK) LOCK FLUSH 100 UNIT/ML IV SOLN
500.0000 [IU] | Freq: Once | INTRAVENOUS | Status: AC | PRN
  Administered 2016-02-23: 500 [IU]
  Filled 2016-02-23: qty 5

## 2016-02-23 MED ORDER — SODIUM CHLORIDE 0.9 % IV SOLN
600.0000 mg/m2 | Freq: Once | INTRAVENOUS | Status: AC
  Administered 2016-02-23: 1320 mg via INTRAVENOUS
  Filled 2016-02-23: qty 50

## 2016-02-23 MED ORDER — SODIUM CHLORIDE 0.9 % IV SOLN
Freq: Once | INTRAVENOUS | Status: AC
  Administered 2016-02-23: 10:00:00 via INTRAVENOUS

## 2016-02-23 MED ORDER — ALTEPLASE 2 MG IJ SOLR: 2.0000 mg | Freq: Once | INTRAMUSCULAR | Status: DC | PRN

## 2016-02-23 MED ORDER — PEGFILGRASTIM 6 MG/0.6ML ~~LOC~~ PSKT
PREFILLED_SYRINGE | SUBCUTANEOUS | Status: AC
  Filled 2016-02-23: qty 0.6

## 2016-02-23 NOTE — Patient Instructions (Signed)
[facility] Discharge Instructions for Patients Receiving Chemotherapy   Beginning January 23rd 2017 lab work for the [facility] will be done in the  Main lab at [facility] on 1st floor. If you have a lab appointment with the [facility] please come in thru the  Main Entrance and check in at the main information desk   Today you received the following chemotherapy agents Taxotere and Cytoxan. Follow-up as scheduled. Call clinic for any questions or concerns  To help prevent nausea and vomiting after your treatment, we encourage you to take your nausea medication   If you develop nausea and vomiting, or diarrhea that is not controlled by your medication, call the clinic.  The clinic phone number is [phone]. Office hours are Monday-Friday 8:30am-5:00pm.  BELOW ARE SYMPTOMS THAT SHOULD BE REPORTED IMMEDIATELY:  *FEVER GREATER THAN 101.0 F  *CHILLS WITH OR WITHOUT FEVER  NAUSEA AND VOMITING THAT IS NOT CONTROLLED WITH YOUR NAUSEA MEDICATION  *UNUSUAL SHORTNESS OF BREATH  *UNUSUAL BRUISING OR BLEEDING  TENDERNESS IN MOUTH AND THROAT WITH OR WITHOUT PRESENCE OF ULCERS  *URINARY PROBLEMS  *BOWEL PROBLEMS  UNUSUAL RASH Items with * indicate a potential emergency and should be followed up as soon as possible. If you have an emergency after office hours please contact your primary care physician or go to the nearest emergency department.  Please call the clinic during office hours if you have any questions or concerns.   You may also contact the Patient Navigator at [phone] should you have any questions or need assistance in obtaining follow up care.      Resources For Cancer Patients and their Caregivers ? American Cancer Society: Can assist with transportation, wigs, general needs, runs Look Good Feel Better.        [phone] ? Cancer Care: Provides financial assistance, online support groups, medication/co-pay assistance.   1-800-813-HOPE 253-722-0638) ? [place] Assists [place] cancer patients and their families through emotional , educational and financial support.  331-247-3652 ? [place] DSS Where to apply for food stamps, Medicaid and utility assistance. [phone] ? RCATS: Transportation to medical appointments. [phone] ? Social Security Administration: May apply for disability if have a Stage IV cancer. [phone] ? [employer], Disability and Transit Services: Assists with nutrition, care and transit needs. [phone]

## 2016-02-23 NOTE — Progress Notes (Signed)
[name], MD [place] [address]  Pulmonary nodule - Plan: NM PET Image Initial (PI) Skull Base To Thigh  Carcinoma of upper-outer quadrant of left female breast [facility])  CURRENT THERAPY: TC beginning on 01/12/2016  INTERVAL HISTORY: Taylor Williams 65 y.o. female returns for followup of Stage IA (T1BN0M0) left invasive breast cancer, triple negative, 0.8 cm in size, S/P left lumpectomy by Dr. Ladona Horns on 11/30/2015.    Carcinoma of upper-outer quadrant of left female breast ([place])   11/30/2015 Surgery    Lumpectomy and sentinel node procedure with Dr. Ladona Horns     11/30/2015 Surgery    Needle localization of the L breast with mammo guidance     11/30/2015 Pathology Results    L breast invasive ductal carcinoma, mod diff, 0.8 cm, focal DCIS, high grade, 2/2 LN negative, no LVI, pT1b, pN0M0, ER- PR- HER2 - Ki-67 92%     01/12/2016 -  Chemotherapy    TC x 4 cycles     02/01/2016 Imaging    CT chest screening- Lung-RADS Category 4B, suspicious. Additional imaging evaluation or consultation with pulmonary medicine or thoracic surgery recommended. Bilateral pulmonary nodules, including an inferior right upper lobe spiculated 11 mm nodule.       The patient reports that 1-2 weeks ago, she called the clinic and left a message with a complaint.  Unfortunately, she was not called back.  She is provided additional phone number for contacting the clinic and she is advised to call the clinic again if she called and does not hear back within 1 hour.   Obviously, there was a breakdown on our behalf regarding this phone call.    She notes some nausea approximately 4 days post-treatment.  This lasted about 5 days days and was well controlled with home antiemetics.  She reports that this treatment went much better than her previous treatment.    She notes B/L LE edema that is new.  It is tender to palpation, but improved with elevation.    She also notes some loose stools.  This  too has resolved at this time and she notes that this cycle of treatment, with regards to her bowels, was much improved compared to her last treatments.  Review of Systems  Constitutional: Negative for chills, fever, malaise/fatigue and weight loss.  HENT: Negative.   Eyes: Negative.  Negative for blurred vision and double vision.  Respiratory: Negative.  Negative for cough and shortness of breath.   Cardiovascular: Positive for leg swelling. Negative for chest pain.  Gastrointestinal: Positive for diarrhea, nausea and vomiting. Negative for abdominal pain and constipation.  Genitourinary: Negative.   Musculoskeletal: Negative.   Skin: Negative.  Negative for itching and rash.  Neurological: Negative.  Negative for weakness and headaches.  Endo/Heme/Allergies: Negative.   Psychiatric/Behavioral: Negative.     Past Medical History:  Diagnosis Date  . Anxiety   . Depression   . High cholesterol   . Hypertension     Past Surgical History:  Procedure Laterality Date  . BREAST LUMPECTOMY Left    states for ductal carcinoma, had surgery in late may    No family history on file.  Social History   Social History  . Marital status: Widowed    Spouse name: N/A  . Number of children: N/A  . Years of education: N/A   Social History Main Topics  . Smoking status: Current Every Day Smoker    Packs/day:  1.00    Years: 40.00  . Smokeless tobacco: Not on file  . Alcohol use No  . Drug use: No  . Sexual activity: Not on file   Other Topics Concern  . Not on file   Social History Narrative  . No narrative on file     PHYSICAL EXAMINATION  ECOG PERFORMANCE STATUS: 1 - Symptomatic but completely ambulatory  Vitals:   02/23/16 0839  BP: 130/66  Pulse: 75  Resp: 18  Temp: 97.7 F (36.5 C)    GENERAL:alert, no distress, well nourished, well developed, comfortable, cooperative, obese, smiling and unaccompanied. SKIN: skin color, texture, turgor are normal, no rashes or  significant lesions HEAD: Normocephalic, No masses, lesions, tenderness or abnormalities EYES: normal, EOMI, Conjunctiva are pink and non-injected EARS: External ears normal OROPHARYNX:no exudate, no erythema, lips, buccal mucosa, and tongue normal and mucous membranes are moist  NECK: supple, trachea midline LYMPH:  not examined BREAST:not examined LUNGS: clear to auscultation and percussion HEART: regular rate & rhythm ABDOMEN:abdomen soft, obese and normal bowel sounds BACK: Back symmetric, no curvature. EXTREMITIES:less then 2 second capillary refill, no joint deformities, effusion, or inflammation, no skin discoloration.  B/L LE edema, 1 + pitting without heat, but with tenderness to deep palpation. NEURO: alert & oriented x 3 with fluent speech, no focal motor/sensory deficits, in wheelchair   LABORATORY DATA: CBC    Component Value Date/Time   WBC 10.8 (H) 02/23/2016 0900   RBC 3.83 (L) 02/23/2016 0900   HGB 11.5 (L) 02/23/2016 0900   HCT 33.2 (L) 02/23/2016 0900   PLT 252 02/23/2016 0900   MCV 86.7 02/23/2016 0900   MCH 30.0 02/23/2016 0900   MCHC 34.6 02/23/2016 0900   RDW 17.9 (H) 02/23/2016 0900   LYMPHSABS 0.8 02/23/2016 0900   MONOABS 0.5 02/23/2016 0900   EOSABS 0.0 02/23/2016 0900   BASOSABS 0.0 02/23/2016 0900      Chemistry      Component Value Date/Time   NA 131 (L) 02/23/2016 0900   K 4.1 02/23/2016 0900   CL 101 02/23/2016 0900   CO2 22 02/23/2016 0900   BUN 9 02/23/2016 0900   CREATININE 0.54 02/23/2016 0900      Component Value Date/Time   CALCIUM 8.5 (L) 02/23/2016 0900   ALKPHOS 60 02/23/2016 0900   AST 21 02/23/2016 0900   ALT 20 02/23/2016 0900   BILITOT 0.4 02/23/2016 0900        PENDING LABS:   RADIOGRAPHIC STUDIES:  Ct Chest Lung Cancer Screening Low Dose Wo Contrast  Result Date: 02/02/2016 CLINICAL DATA:  Smoker. Fifty pack-year. Asymptomatic. Left-sided breast cancer. EXAM: CT CHEST WITHOUT CONTRAST LOW-DOSE FOR LUNG CANCER  SCREENING TECHNIQUE: Multidetector CT imaging of the chest was performed following the standard protocol without IV contrast. COMPARISON:  None. FINDINGS: Mediastinum/Nodes: Small bilateral low jugular/supraclavicular nodes. A right Port-A-Cath which terminates at the superior caval/ atrial junction. No axillary adenopathy. Skin thickening about the left breast on image 26/series 2. Lateral left breast 3.3 x 3.0 cm fluid density structure may be post surgical or biopsy related. Example image 25/series 2. Aortic and branch vessel atherosclerosis. Normal heart size, without pericardial effusion. Multivessel coronary artery atherosclerosis. Increased number of small middle mediastinal nodes. None are pathologic by size criteria. Hilar regions poorly evaluated without intravenous contrast. Prevascular nodes of up to 11 mm on image 21/series 2. Lungs/Pleura: No pleural fluid. Mild centrilobular emphysema. Lower lobe predominant bronchial wall thickening. Bilateral pulmonary nodules are identified. The largest,  most suspicious nodule is positioned along the right minor fissure, primarily within the inferior right upper lobe. This measures volume derived equivalent diameter 11.3 mm, including on image 138/series 3 and sagittal image 300. This has spiculated borders. The second largest nodule is in the right middle lobe and may be calcified. This measures volume derived equivalent diameter 7.2 mm. Example image 166/series 3. Upper abdomen: Prominent lateral segment left liver lobe and caudate lobes. Subtle irregular hepatic capsule. Cholecystectomy. Normal imaged portions of the spleen, stomach, pancreas, adrenal glands, kidneys. Left upper quadrant collateral veins on image 66/series 2. There may be retroperitoneal portosystemic collaterals as well. Musculoskeletal: Moderate lower thoracic spondylosis. IMPRESSION: 1. Lung-RADS Category 4B, suspicious. Additional imaging evaluation or consultation with pulmonary medicine or  thoracic surgery recommended. Bilateral pulmonary nodules, including an inferior right upper lobe spiculated 11 mm nodule. Consider further evaluation with PET. If this is not performed, CT followup at 3 months is recommended. 2. Increased number and size of mediastinal nodes. These are indeterminate could be reactive versus metastatic (especially given history of left breast cancer). This could also be better evaluated with PET. 3. Suspicion of cirrhosis and portal venous hypertension, incompletely evaluated. The liver would be more entirely imaged on PET. This also may warrant dedicated pre and post contrast abdominal MRI. Correlate with risk factors for cirrhosis. 4. Age advanced coronary artery atherosclerosis. Recommend assessment of coronary risk factors and consideration of medical therapy. 5.  Aortic atherosclerosis. These results will be called to the ordering clinician or representative by the Radiologist Assistant, and communication documented in the PACS or zVision Dashboard. Electronically Signed   By: Abigail Miyamoto M.D.   On: 02/02/2016 09:10     PATHOLOGY:    ASSESSMENT AND PLAN:  Carcinoma of upper-outer quadrant of left female breast ([place]) Stage IA (T1BN0M0) left invasive breast cancer, triple negative, 0.8 cm in size, S/P left lumpectomy by Dr. Ladona Horns on 11/30/2015. TC adjuvant chemotherapy began on 01/12/2016.  Oncology history updated.  Pre-treatment labs: CBC diff, CMET.  I personally reviewed and went over laboratory results with the patient.  The results are noted within this dictation.  Labs meet treatment criteria.  I personally reviewed and went over radiographic studies with the patient.  The results are noted within this dictation.  CT screening of chest is concerning for bilateral pulmonary nodules with one measuring 11 mm with spiculated margins.  As a result, following completion of chemotherapy, she will undergo PET scan imaging.  Smoking cessation strongly  encouraged.  B/L LE edema, 1+ pitting.  She experienced an episode of hypotension following treatment, as a result, I recommended holding her antihypertensive medication.  Her BP is back to baseline and her antihypertensive medication contains HCTZ.  I have recommended she restart her BP medication at 1/2 dose if scored tablets.  This should help with her leg edema.  She is provided nurse navigator phone number: [phone].  Refer to XRT for consideration of radiation therapy following completion of chemotherapy.  For her nausea, I have recommended Zofran every 8 hours scheduled during her time of nausea, with phenergan for breakthrough treatment.  She is educated on the possibility of zofran-induced constipation and to be on the look-out for this issue.  Return in 3 weeks for follow-up and next cycle of chemotherapy.   ORDERS PLACED FOR THIS ENCOUNTER: Orders Placed This Encounter  Procedures  . NM PET Image Initial (PI) Skull Base To Thigh    MEDICATIONS PRESCRIBED THIS ENCOUNTER: No orders of the  defined types were placed in this encounter.   THERAPY PLAN:  Continue with treatment as planned.  All questions were answered. The patient knows to call the clinic with any problems, questions or concerns. We can certainly see the patient much sooner if necessary.  Patient and plan discussed with Dr. Ancil Linsey and she is in agreement with the aforementioned.   This note is electronically signed by: Doy Mince 02/24/2016 10:32 PM

## 2016-02-23 NOTE — Patient Instructions (Signed)
[place] at [facility] Discharge Instructions  RECOMMENDATIONS MADE BY THE CONSULTANT AND ANY TEST RESULTS WILL BE SENT TO YOUR REFERRING PHYSICIAN.  You were seen by Gershon Mussel today. Anderson Malta, the navigator's number is [phone] You are being referred for Radiation Restart antihypertensive medication (1/2 tablet) PET scan after cycle #4 Return 3 wks for follow up Recommend Zofran every 8 hrs during time of nausea  Thank you for choosing [place] at [facility] to provide your oncology and hematology care.  To afford each patient quality time with our provider, please arrive at least 15 minutes before your scheduled appointment time.   Beginning January 23rd 2017 lab work for the [facility] will be done in the  Main lab at [facility] on 1st floor. If you have a lab appointment with the [facility] please come in thru the  Main Entrance and check in at the main information desk  You need to re-schedule your appointment should you arrive 10 or more minutes late.  We strive to give you quality time with our providers, and arriving late affects you and other patients whose appointments are after yours.  Also, if you no show three or more times for appointments you may be dismissed from the clinic at the providers discretion.     Again, thank you for choosing [facility].  Our hope is that these requests will decrease the amount of time that you wait before being seen by our physicians.       _____________________________________________________________  Should you have questions after your visit to [facility], please contact our office at [phone] between the hours of 8:30 a.m. and 4:30 p.m.  Voicemails left after 4:30 p.m. will not be returned until the following business day.  For prescription refill requests, have your pharmacy contact our office.         Resources For Cancer Patients and their  Caregivers ? American Cancer Society: Can assist with transportation, wigs, general needs, runs Look Good Feel Better.        [phone] ? Cancer Care: Provides financial assistance, online support groups, medication/co-pay assistance.  1-800-813-HOPE (825)045-0696) ? [place] Assists [place] cancer patients and their families through emotional , educational and financial support.  [phone] ? [place] DSS Where to apply for food stamps, Medicaid and utility assistance. 251-603-8344 ? RCATS: Transportation to medical appointments. 7816767539 ? Social Security Administration: May apply for disability if have a Stage IV cancer. [phone] ? [employer], Disability and Transit Services: Assists with nutrition, care and transit needs. [place] Support Programs: @10RELATIVEDAYS @ > Cancer Support Group  2nd Tuesday of the month 1pm-2pm, Journey Room  > Creative Journey  3rd Tuesday of the month 1130am-1pm, Journey Room  > Look Good Feel Better  1st Wednesday of the month 10am-12 noon, Journey Room (Call [facility] to register [phone])

## 2016-02-23 NOTE — Assessment & Plan Note (Addendum)
Stage IA (T1BN0M0) left invasive breast cancer, triple negative, 0.8 cm in size, S/P left lumpectomy by Dr. Ladona Horns on 11/30/2015. TC adjuvant chemotherapy began on 01/12/2016.  Oncology history updated.  Pre-treatment labs: CBC diff, CMET.  I personally reviewed and went over laboratory results with the patient.  The results are noted within this dictation.  Labs meet treatment criteria.  I personally reviewed and went over radiographic studies with the patient.  The results are noted within this dictation.  CT screening of chest is concerning for bilateral pulmonary nodules with one measuring 11 mm with spiculated margins.  As a result, following completion of chemotherapy, she will undergo PET scan imaging.  Smoking cessation strongly encouraged.  B/L LE edema, 1+ pitting.  She experienced an episode of hypotension following treatment, as a result, I recommended holding her antihypertensive medication.  Her BP is back to baseline and her antihypertensive medication contains HCTZ.  I have recommended she restart her BP medication at 1/2 dose if scored tablets.  This should help with her leg edema.  She is provided nurse navigator phone number: [phone].  Refer to XRT for consideration of radiation therapy following completion of chemotherapy.  For her nausea, I have recommended Zofran every 8 hours scheduled during her time of nausea, with phenergan for breakthrough treatment.  She is educated on the possibility of zofran-induced constipation and to be on the look-out for this issue.  Return in 3 weeks for follow-up and next cycle of chemotherapy.

## 2016-02-23 NOTE — Progress Notes (Unsigned)
Referral to [facility].  Records faxed on 7/28 ,they will contact patient.

## 2016-02-23 NOTE — Progress Notes (Signed)
Bethania Schlotzhauer tolerated chemo tx well without issues. Neulasta on-pro applied to right arm. Pt discharged self ambulatory in satisfactory condition with family

## 2016-02-23 NOTE — Progress Notes (Signed)
Labs reviewed. Glucose 263. Pt reported her glucose was 272 this am at home and she took 6 units of her insulin at that time. Kirby Crigler PA made aware.No new orders

## 2016-02-27 ENCOUNTER — Encounter (HOSPITAL_BASED_OUTPATIENT_CLINIC_OR_DEPARTMENT_OTHER): Payer: Medicare Other | Admitting: Oncology

## 2016-02-27 ENCOUNTER — Other Ambulatory Visit (HOSPITAL_COMMUNITY): Payer: Self-pay | Admitting: Oncology

## 2016-02-27 ENCOUNTER — Encounter (HOSPITAL_COMMUNITY): Payer: Medicare Other | Attending: Hematology & Oncology

## 2016-02-27 VITALS — BP 120/61 | HR 84 | Temp 97.7°F | Resp 18 | Wt 220.4 lb

## 2016-02-27 DIAGNOSIS — Z7984 Long term (current) use of oral hypoglycemic drugs: Secondary | ICD-10-CM | POA: Diagnosis not present

## 2016-02-27 DIAGNOSIS — F17210 Nicotine dependence, cigarettes, uncomplicated: Secondary | ICD-10-CM | POA: Insufficient documentation

## 2016-02-27 DIAGNOSIS — C50912 Malignant neoplasm of unspecified site of left female breast: Secondary | ICD-10-CM | POA: Diagnosis not present

## 2016-02-27 DIAGNOSIS — Z9889 Other specified postprocedural states: Secondary | ICD-10-CM | POA: Insufficient documentation

## 2016-02-27 DIAGNOSIS — F17200 Nicotine dependence, unspecified, uncomplicated: Secondary | ICD-10-CM | POA: Diagnosis not present

## 2016-02-27 DIAGNOSIS — F419 Anxiety disorder, unspecified: Secondary | ICD-10-CM | POA: Insufficient documentation

## 2016-02-27 DIAGNOSIS — F329 Major depressive disorder, single episode, unspecified: Secondary | ICD-10-CM | POA: Diagnosis not present

## 2016-02-27 DIAGNOSIS — I951 Orthostatic hypotension: Secondary | ICD-10-CM

## 2016-02-27 DIAGNOSIS — T80219A Unspecified infection due to central venous catheter, initial encounter: Secondary | ICD-10-CM | POA: Diagnosis not present

## 2016-02-27 DIAGNOSIS — C50412 Malignant neoplasm of upper-outer quadrant of left female breast: Secondary | ICD-10-CM

## 2016-02-27 DIAGNOSIS — I10 Essential (primary) hypertension: Secondary | ICD-10-CM | POA: Insufficient documentation

## 2016-02-27 DIAGNOSIS — R739 Hyperglycemia, unspecified: Secondary | ICD-10-CM | POA: Diagnosis not present

## 2016-02-27 DIAGNOSIS — E876 Hypokalemia: Secondary | ICD-10-CM | POA: Diagnosis not present

## 2016-02-27 DIAGNOSIS — Z79899 Other long term (current) drug therapy: Secondary | ICD-10-CM | POA: Insufficient documentation

## 2016-02-27 DIAGNOSIS — T80212A Local infection due to central venous catheter, initial encounter: Secondary | ICD-10-CM

## 2016-02-27 DIAGNOSIS — E7800 Pure hypercholesterolemia, unspecified: Secondary | ICD-10-CM | POA: Diagnosis not present

## 2016-02-27 LAB — COMPREHENSIVE METABOLIC PANEL
ALK PHOS: 58 U/L (ref 38–126)
ALT: 35 U/L (ref 14–54)
ANION GAP: 5 (ref 5–15)
AST: 36 U/L (ref 15–41)
Albumin: 2.9 g/dL — ABNORMAL LOW (ref 3.5–5.0)
BILIRUBIN TOTAL: 1 mg/dL (ref 0.3–1.2)
BUN: 17 mg/dL (ref 6–20)
CALCIUM: 8.2 mg/dL — AB (ref 8.9–10.3)
CO2: 27 mmol/L (ref 22–32)
Chloride: 100 mmol/L — ABNORMAL LOW (ref 101–111)
Creatinine, Ser: 0.65 mg/dL (ref 0.44–1.00)
GLUCOSE: 96 mg/dL (ref 65–99)
POTASSIUM: 3.4 mmol/L — AB (ref 3.5–5.1)
Sodium: 132 mmol/L — ABNORMAL LOW (ref 135–145)
TOTAL PROTEIN: 5.8 g/dL — AB (ref 6.5–8.1)

## 2016-02-27 LAB — CBC WITH DIFFERENTIAL/PLATELET
Basophils Absolute: 0.4 10*3/uL — ABNORMAL HIGH (ref 0.0–0.1)
Basophils Relative: 3 %
EOS ABS: 0.2 10*3/uL (ref 0.0–0.7)
EOS PCT: 1 %
HEMATOCRIT: 31.9 % — AB (ref 36.0–46.0)
HEMOGLOBIN: 11.1 g/dL — AB (ref 12.0–15.0)
LYMPHS ABS: 0.8 10*3/uL (ref 0.7–4.0)
LYMPHS PCT: 5 %
MCH: 30.4 pg (ref 26.0–34.0)
MCHC: 34.8 g/dL (ref 30.0–36.0)
MCV: 87.4 fL (ref 78.0–100.0)
MONO ABS: 0.1 10*3/uL (ref 0.1–1.0)
MONOS PCT: 1 %
Neutro Abs: 14 10*3/uL — ABNORMAL HIGH (ref 1.7–7.7)
Neutrophils Relative %: 90 %
Platelets: 141 10*3/uL — ABNORMAL LOW (ref 150–400)
RBC: 3.65 MIL/uL — AB (ref 3.87–5.11)
RDW: 18.6 % — AB (ref 11.5–15.5)
WBC: 15.5 10*3/uL — AB (ref 4.0–10.5)

## 2016-02-27 MED ORDER — SULFAMETHOXAZOLE-TRIMETHOPRIM 800-160 MG PO TABS: 1.0000 | ORAL_TABLET | Freq: Two times a day (BID) | ORAL | 0 refills | Status: AC

## 2016-02-27 MED ORDER — SODIUM CHLORIDE 0.9 % IV SOLN
INTRAVENOUS | Status: DC
  Administered 2016-02-27: 13:00:00 via INTRAVENOUS

## 2016-02-27 NOTE — Progress Notes (Signed)
Patient is seen as a work-in after [place] from [facility] called reporting the patient was experiencing orthostatic hypotension issues.  As a result, the patient was advised to report to [facility] for management.  Orders placed for IV NS.  Vitals here were stable. BP 120/61 P 84 T 97.7 R 18  Upon arrival to the clinic, nursing was about to access her port and notes that the port site was erythematous with two open areas.  When she was treated, the most lateral open area was present, but closed and there was not erythema at port site.  She noted, at that time, that she "picked a scab."  PORT: Right sided chest wall port-a-cath that is erythematous and warm to the touch. 2 open areas with minimal to no drainage. :    She denies any fevers.  She is noted to be chilled and therefore, she is given 2 warm blankets.  Assessment: 1. Possible infected port 2. Hypokalemia  Plan: 1. Given that she is asymptomatic, and her neutrophil count is solid (she did receive Neulasta with her last treatment), we will treat with Bactrim BS BID.  She will return on Thursday for re-assessment.  If not better, we will refer her to her surgeon, Dr. Juliann Pulse for evaluation and management.  If significantly worse, she may require inpatient work-up and management. 2. Labs today: CBC diff, CMET, Blood culture. 3. No port access.  Peripheral line is started today for IV fluids. 4. She will restart her potassium replacement at home. 5. She is going to take her BP medication every other day. 6. Return on Thursday for evaluation or port site.  Robynn Pane, PA-C 02/27/2016 4:32 PM   Addendum: Lab called about this patient's blood smear.  They have requested I look at it.  They will send smear for pathology review.  Abnormalities are likely secondary to Neulasta and possible infection as outlined above.

## 2016-02-27 NOTE — Progress Notes (Signed)
Patient port site red with streaking to left lower area,warmth, and reported pain and tenderness.  Patient reports that it has been "oozing" pink drainage since yesterday.  Two open areas noted on the incision line, both with no drainage at this time, but with yellow soggy skin surrounding both openings and it appears that they have been draining.  MD and PA aware.  Port will not be accessed.  Peripheral IV started for hydration.  Lab notified of inability of nursing staff to draw labs as ordered and they came to the floor and were able to draw ordered labs.  IVF tolerated well.  VSS.  Patient to return Thursday to have her Port reassessed as well as she is to start Bactrim as ordered.  Patient is aware of this and that she needs to start taking potassium daily, she has this at home.  Patient verbalized understanding of all instructions and left the clinic ambulatory.

## 2016-02-27 NOTE — Patient Instructions (Signed)
[place] at [facility] Discharge Instructions  RECOMMENDATIONS MADE BY THE CONSULTANT AND ANY TEST RESULTS WILL BE SENT TO YOUR REFERRING PHYSICIAN.  IV fluids.    Thank you for choosing [place] at [facility] to provide your oncology and hematology care.  To afford each patient quality time with our provider, please arrive at least 15 minutes before your scheduled appointment time.   Beginning January 23rd 2017 lab work for the [facility] will be done in the  Main lab at [facility] on 1st floor. If you have a lab appointment with the [place] please come in thru the  Main Entrance and check in at the main information desk  You need to re-schedule your appointment should you arrive 10 or more minutes late.  We strive to give you quality time with our providers, and arriving late affects you and other patients whose appointments are after yours.  Also, if you no show three or more times for appointments you may be dismissed from the clinic at the providers discretion.     Again, thank you for choosing [facility]  Our hope is that these requests will decrease the amount of time that you wait before being seen by our physicians.       _____________________________________________________________  Should you have questions after your visit to [facility], please contact our office at [phone] between the hours of 8:30 a.m. and 4:30 p.m.  Voicemails left after 4:30 p.m. will not be returned until the following business day.  For prescription refill requests, have your pharmacy contact our office.         Resources For Cancer Patients and their Caregivers ? American Cancer Society: Can assist with transportation, wigs, general needs, runs Look Good Feel Better.        203-308-3475 ? Cancer Care: Provides financial assistance, online support groups, medication/co-pay assistance.  1-800-813-HOPE  3234975848) ? [place] Assists [place] cancer patients and their families through emotional , educational and financial support.  4692163876 ? [place] DSS Where to apply for food stamps, Medicaid and utility assistance. 727-673-8864 ? RCATS: Transportation to medical appointments. 641-328-1191 ? Social Security Administration: May apply for disability if have a Stage IV cancer. 4176022716 512-485-6816 ? [employer], Disability and Transit Services: Assists with nutrition, care and transit needs. [place] Support Programs: @10RELATIVEDAYS @ > Cancer Support Group  2nd Tuesday of the month 1pm-2pm, Journey Room  > Creative Journey  3rd Tuesday of the month 1130am-1pm, Journey Room  > Look Good Feel Better  1st Wednesday of the month 10am-12 noon, Journey Room (Call [place] to register [phone])

## 2016-02-28 LAB — PATHOLOGIST SMEAR REVIEW

## 2016-02-29 ENCOUNTER — Encounter (HOSPITAL_COMMUNITY)
Admission: RE | Admit: 2016-02-29 | Discharge: 2016-02-29 | Disposition: A | Discharge status: Home / Self Care | Payer: Medicare Other | Source: Ambulatory Visit | Attending: Hematology & Oncology | Admitting: Hematology & Oncology

## 2016-02-29 ENCOUNTER — Encounter (HOSPITAL_COMMUNITY): Payer: Self-pay

## 2016-02-29 ENCOUNTER — Other Ambulatory Visit (HOSPITAL_COMMUNITY): Payer: Self-pay | Admitting: Oncology

## 2016-02-29 ENCOUNTER — Encounter (HOSPITAL_COMMUNITY): Payer: Medicare Other

## 2016-02-29 DIAGNOSIS — C50412 Malignant neoplasm of upper-outer quadrant of left female breast: Secondary | ICD-10-CM | POA: Diagnosis not present

## 2016-02-29 DIAGNOSIS — R110 Nausea: Secondary | ICD-10-CM

## 2016-02-29 MED ORDER — LORAZEPAM 2 MG/ML IJ SOLN
INTRAMUSCULAR | Status: AC
  Filled 2016-02-29: qty 1

## 2016-02-29 MED ORDER — DEXAMETHASONE SODIUM PHOSPHATE 4 MG/ML IJ SOLN
INTRAMUSCULAR | Status: AC
  Filled 2016-02-29: qty 1

## 2016-02-29 MED ORDER — SODIUM CHLORIDE 0.9 % IV SOLN
INTRAVENOUS | Status: DC
  Administered 2016-02-29: 11:00:00 via INTRAVENOUS

## 2016-02-29 MED ORDER — SODIUM CHLORIDE 0.9 % IV SOLN
4.0000 mg | Freq: Once | INTRAVENOUS | Status: AC
  Administered 2016-02-29: 4 mg via INTRAVENOUS
  Filled 2016-02-29: qty 0.4

## 2016-02-29 MED ORDER — LORAZEPAM 2 MG/ML IJ SOLN
0.5000 mg | Freq: Once | INTRAMUSCULAR | Status: AC
  Administered 2016-02-29: 0.5 mg via INTRAVENOUS

## 2016-02-29 NOTE — Patient Instructions (Signed)
[place] at [facility] Discharge Instructions  RECOMMENDATIONS MADE BY THE CONSULTANT AND ANY TEST RESULTS WILL BE SENT TO YOUR REFERRING PHYSICIAN.  Port looking better today Will get IVF's and nausea meds today in short stay. Call for any concerns or problems Go to the ER if you have chills or fever.  Thank you for choosing [place] at [facility] to provide your oncology and hematology care.  To afford each patient quality time with our provider, please arrive at least 15 minutes before your scheduled appointment time.   Beginning January 23rd 2017 lab work for the [facility] will be done in the  Main lab at [facility] on 1st floor. If you have a lab appointment with the [place] please come in thru the  Main Entrance and check in at the main information desk  You need to re-schedule your appointment should you arrive 10 or more minutes late.  We strive to give you quality time with our providers, and arriving late affects you and other patients whose appointments are after yours.  Also, if you no show three or more times for appointments you may be dismissed from the clinic at the providers discretion.     Again, thank you for choosing [facility].  Our hope is that these requests will decrease the amount of time that you wait before being seen by our physicians.       _____________________________________________________________  Should you have questions after your visit to [facility], please contact our office at [phone] between the hours of 8:30 a.m. and 4:30 p.m.  Voicemails left after 4:30 p.m. will not be returned until the following business day.  For prescription refill requests, have your pharmacy contact our office.         Resources For Cancer Patients and their Caregivers ? American Cancer Society: Can assist with transportation, wigs, general needs, runs Look Good Feel Better.         ([phone] ? Cancer Care: Provides financial assistance, online support groups, medication/co-pay assistance.  1-800-813-HOPE (814)043-2873) ? [place] Assists [place] cancer patients and their families through emotional , educational and financial support.  7744628625 ? [place] DSS Where to apply for food stamps, Medicaid and utility assistance. (805)677-4382 ? RCATS: Transportation to medical appointments. 989-753-6376 ? Social Security Administration: May apply for disability if have a Stage IV cancer. (223) 295-6496 (424) 236-6991 ? [employer], Disability and Transit Services: Assists with nutrition, care and transit needs. [place] Support Programs: @10RELATIVEDAYS @ > Cancer Support Group  2nd Tuesday of the month 1pm-2pm, Journey Room  > Creative Journey  3rd Tuesday of the month 1130am-1pm, Journey Room  > Look Good Feel Better  1st Wednesday of the month 10am-12 noon, Journey Room (Call [place] to register [phone])

## 2016-02-29 NOTE — Progress Notes (Signed)
Patient presented today for check on her port. Dr.Penland assessed and has referred her to have IVF today in short stay. This was set up patient is going to short stay to get her meds and IVF's.

## 2016-03-03 LAB — CULTURE, BLOOD (ROUTINE X 2): CULTURE: NO GROWTH

## 2016-03-07 ENCOUNTER — Encounter (HOSPITAL_BASED_OUTPATIENT_CLINIC_OR_DEPARTMENT_OTHER): Payer: Medicare Other | Admitting: Oncology

## 2016-03-07 VITALS — BP 130/59 | HR 81 | Temp 98.1°F | Resp 18

## 2016-03-07 DIAGNOSIS — C50412 Malignant neoplasm of upper-outer quadrant of left female breast: Secondary | ICD-10-CM | POA: Diagnosis not present

## 2016-03-07 NOTE — Progress Notes (Signed)
Taylor Williams is here for a port check.  She is afebrile.  She denies any pain at her port site.  She has been on Bactrim since 8/1.  Her erythema is much improved.  Her erythema is localized to port site only.  No open areas, but healing open areas are healing.  No drainage.  Warmth to palpation has subsided.    She did discuss smoking cessation with me. She is interested in behavioral modification intervention for her tobacco abuse. She is provided a [place] tobacco cessation program pamphlets that provides her a telephone number and is free to patient's.  She will return at the beginning of next week for further Port-A-Cath assessment at which time I will decide whether continued antibiotic therapy is necessary. She is due for treatment next Friday.  Patient and plan discussed with Dr. Ancil Linsey and she is in agreement with the aforementioned.    Marvelene Stoneberg, PA-C 03/07/2016 7:07 PM

## 2016-03-11 ENCOUNTER — Encounter (HOSPITAL_BASED_OUTPATIENT_CLINIC_OR_DEPARTMENT_OTHER): Payer: Medicare Other | Admitting: Oncology

## 2016-03-11 ENCOUNTER — Encounter (HOSPITAL_COMMUNITY): Payer: Self-pay | Admitting: Oncology

## 2016-03-11 VITALS — BP 123/58 | HR 83 | Temp 98.7°F | Resp 16 | Wt 214.1 lb

## 2016-03-11 DIAGNOSIS — L539 Erythematous condition, unspecified: Secondary | ICD-10-CM

## 2016-03-11 DIAGNOSIS — C50412 Malignant neoplasm of upper-outer quadrant of left female breast: Secondary | ICD-10-CM

## 2016-03-11 NOTE — Progress Notes (Addendum)
Patient is here for a port-check today.  She notes that she has 1.5 more days worth of antibiotics.  She denies any fevers of chills.  She denies any drainage from her port.  Her port is nontender.  There is a new open area that is not draining in the middle of her port.  See picture below.  She reports that her soles of her feet are peeling without erythema or tenderness to palpation.  PE: Vitals:   03/11/16 1233  BP: (!) 123/58  Pulse: 83  Resp: 16  Temp: 98.7 F (37.1 C)   Gen: Alopecia, well looking.  NAD. HEENT: Atraumatic, normocephalic. Port: red with an open area.    Extremities: mild skin pealing on the lateral aspects of feet without erythema or tenderness.  Assessment: 1. Erythematous port 2. Skin peeling of feet.  Plan: 1. Complete course of antibiotic. 2. Will discuss with Dr. Whitney Muse. 3. Return as planned for follow-up.  Patient and plan discussed with Dr. Ancil Linsey and she is in agreement with the aforementioned.   Robynn Pane, PA-C 03/11/2016 1:17 PM  Addendum: Nursing was able to get the patient an appointment with Dr. Ladona Horns on Wednesday, 03/13/2016 at 10:30 for port evaluation.  I personally spoke with Dr. Ladona Horns about the patient as well.  Patient is notified.

## 2016-03-11 NOTE — Patient Instructions (Signed)
[place] at [facility] Discharge Instructions  RECOMMENDATIONS MADE BY THE CONSULTANT AND ANY TEST RESULTS WILL BE SENT TO YOUR REFERRING PHYSICIAN.  I will discuss with Dr. Whitney Muse and we will be in touch regarding your port and future plan. For now, return as scheduled for planned chemotherapy on Friday.  Thank you for choosing [place] at [facility] to provide your oncology and hematology care.  To afford each patient quality time with our provider, please arrive at least 15 minutes before your scheduled appointment time.   Beginning January 23rd 2017 lab work for the [facility] will be done in the  Main lab at [facility] on 1st floor. If you have a lab appointment with the [place] please come in thru the  Main Entrance and check in at the main information desk  You need to re-schedule your appointment should you arrive 10 or more minutes late.  We strive to give you quality time with our providers, and arriving late affects you and other patients whose appointments are after yours.  Also, if you no show three or more times for appointments you may be dismissed from the clinic at the providers discretion.     Again, thank you for choosing [facility].  Our hope is that these requests will decrease the amount of time that you wait before being seen by our physicians.       _____________________________________________________________  Should you have questions after your visit to [facility], please contact our office at [phone] between the hours of 8:30 a.m. and 4:30 p.m.  Voicemails left after 4:30 p.m. will not be returned until the following business day.  For prescription refill requests, have your pharmacy contact our office.         Resources For Cancer Patients and their Caregivers ? American Cancer Society: Can assist with transportation, wigs, general needs, runs Look Good Feel  Better.        [phone] ? Cancer Care: Provides financial assistance, online support groups, medication/co-pay assistance.  1-800-813-HOPE 256-712-1336) ? [place] Assists [place] cancer patients and their families through emotional , educational and financial support.  417-561-8688 ? [place] DSS Where to apply for food stamps, Medicaid and utility assistance. 949-175-6363 ? RCATS: Transportation to medical appointments. ([phone] ? Social Security Administration: May apply for disability if have a Stage IV cancer. 340-475-5247 4636771513 ? [employer], Disability and Transit Services: Assists with nutrition, care and transit needs. [place] Support Programs: @10RELATIVEDAYS @ > Cancer Support Group  2nd Tuesday of the month 1pm-2pm, Journey Room  > Creative Journey  3rd Tuesday of the month 1130am-1pm, Journey Room  > Look Good Feel Better  1st Wednesday of the month 10am-12 noon, Journey Room (Call [place] to register [phone])

## 2016-03-11 NOTE — Progress Notes (Signed)
Dr. Ladona Horns will see patient this Wednesday at 10:30am for [facility] evaluation. Called and notified patient. She verbalized understanding.

## 2016-03-13 ENCOUNTER — Other Ambulatory Visit (HOSPITAL_COMMUNITY): Payer: Self-pay | Admitting: Oncology

## 2016-03-13 DIAGNOSIS — C50412 Malignant neoplasm of upper-outer quadrant of left female breast: Secondary | ICD-10-CM

## 2016-03-13 DIAGNOSIS — T80219A Unspecified infection due to central venous catheter, initial encounter: Secondary | ICD-10-CM | POA: Diagnosis not present

## 2016-03-14 ENCOUNTER — Ambulatory Visit (HOSPITAL_COMMUNITY)
Admission: RE | Admit: 2016-03-14 | Discharge: 2016-03-14 | Disposition: A | Discharge status: Home / Self Care | Payer: Medicare Other | Source: Ambulatory Visit | Attending: Oncology | Admitting: Oncology

## 2016-03-14 DIAGNOSIS — C50412 Malignant neoplasm of upper-outer quadrant of left female breast: Secondary | ICD-10-CM | POA: Insufficient documentation

## 2016-03-14 DIAGNOSIS — T80212A Local infection due to central venous catheter, initial encounter: Secondary | ICD-10-CM | POA: Diagnosis not present

## 2016-03-14 DIAGNOSIS — Z884 Allergy status to anesthetic agent status: Secondary | ICD-10-CM | POA: Diagnosis not present

## 2016-03-14 DIAGNOSIS — Z853 Personal history of malignant neoplasm of breast: Secondary | ICD-10-CM | POA: Diagnosis not present

## 2016-03-14 NOTE — Discharge Instructions (Signed)
PICC Insertion, Care After Refer to this sheet in the next few weeks. These instructions provide you with information on caring for yourself after your procedure. Your health care provider may also give you more specific instructions. Your treatment has been planned according to current medical practices, but problems sometimes occur. Call your health care provider if you have any problems or questions after your procedure. WHAT TO EXPECT AFTER THE PROCEDURE After your procedure, it is typical to have the following:  Mild discomfort at the insertion site. This should not last more than a day. HOME CARE INSTRUCTIONS  Rest at home for the remainder of the day after the procedure.  You may bend your arm and move it freely. If your PICC is near or at the bend of your elbow, avoid activity with repeated motion at the elbow.  Avoid lifting heavy objects as instructed by your health care provider.  Avoid using a crutch with the arm on the same side as your PICC. You may need to use a walker. Bandage Care  Keep your PICC bandage (dressing) clean and dry to prevent infection.  Ask your health care provider when you may shower. To keep the dressing dry, cover the PICC with plastic wrap and tape before showering. If the dressing does become wet, replace it right after the shower.  Do not soak in the bath, swim, or use hot tubs when you have a PICC.  Change the PICC dressing as instructed by your health care provider.  Change your PICC dressing if it becomes loose or wet. General PICC Care  Check the PICC insertion site daily for leakage, redness, swelling, or pain.  Flush the PICC as directed by your health care provider. Let your health care provider know right away if the PICC is difficult to flush or does not flush. Do not use force to flush the PICC.  Do not use a syringe that is less than 10 mL to flush the PICC.  Never pull or tug on the PICC.  Avoid blood pressure checks on the arm  with the PICC.  Keep your PICC identification card with you at all times.  Do not take the PICC out yourself. Only a trained health care professional should remove the PICC. SEEK MEDICAL CARE IF:  You have pain in your arm, ear, face, or teeth.  You have fever or chills.  You have drainage from the PICC insertion site.  You have redness or palpate a "cord" around the PICC insertion site.  You cannot flush the catheter. SEEK IMMEDIATE MEDICAL CARE IF:  You have swelling in the arm in which the PICC is inserted.   This information is not intended to replace advice given to you by your health care provider. Make sure you discuss any questions you have with your health care provider.   Document Released: 05/05/2013 Document Revised: 07/20/2013 Document Reviewed: 05/05/2013 Elsevier Interactive Patient Education 2016 Cataract Catheter/Midline Placement  The IV Nurse has discussed with the patient and/or persons authorized to consent for the patient, the purpose of this procedure and the potential benefits and risks involved with this procedure.  The benefits include less needle sticks, lab draws from the catheter and patient may be discharged home with the catheter.  Risks include, but not limited to, infection, bleeding, blood clot (thrombus formation), and puncture of an artery; nerve damage and irregular heat beat.  Alternatives to this procedure were also discussed.  PICC/Midline Placement Documentation  PICC Single  Lumen 58/52/77 PICC Right Basilic 40 cm 0 cm (Active)  Indication for Insertion or Continuance of Line Administration of hyperosmolar/irritating solutions (i.e. TPN, Vancomycin, etc.) 03/14/2016  3:16 PM  Exposed Catheter (cm) 0 cm 03/14/2016  3:16 PM  Site Assessment Clean;Dry;Intact 03/14/2016  3:16 PM  Line Status Flushed;Blood return noted;Capped (central line) 03/14/2016  3:16 PM  Dressing Type Transparent;Securing device 03/14/2016   3:16 PM  Dressing Status Clean;Dry;Intact;Antimicrobial disc in place 03/14/2016  3:16 PM  Line Care Connections checked and tightened 03/14/2016  3:16 PM  Line Adjustment (NICU/IV Team Only) No 03/14/2016  3:16 PM  Dressing Intervention New dressing 03/14/2016  3:16 PM       Taylor Williams, Taylor Williams 03/14/2016, 3:20 [place] A peripherally inserted central catheter (PICC) is a long, thin, flexible tube that is inserted into a vein in the upper arm. It is a form of intravenous (IV) access. It is considered to be a "central" line because the tip of the PICC ends in a large vein in your chest. This large vein is called the superior vena cava (SVC). The PICC tip ends in the SVC because there is a lot of blood flow in the SVC. This allows medicines and IV fluids to be quickly distributed throughout the body. The PICC is inserted using a sterile technique by a specially trained nurse or physician. After the PICC is inserted, a chest X-ray exam is done to be sure it is in the correct place.  A PICC may be placed for different reasons, such as:  To give medicines and liquid nutrition that can only be given through a central line. Examples are:  Certain antibiotic treatments.  Chemotherapy.  Total parenteral nutrition (TPN).  To take frequent blood samples.  To give IV fluids and blood products.  If there is difficulty placing a peripheral intravenous (PIV) catheter. If taken care of properly, a PICC can remain in place for several months. A PICC can also allow a person to go home from the hospital early. Medicine and PICC care can be managed at home by a family member or home health care team. [place] A PICC? Problems with a PICC can occasionally occur. These may include the following:  A blood clot (thrombus) forming in or at the tip of the PICC. This can cause the PICC to become clogged. A clot-dissolving medicine called tissue plasminogen activator (tPA) can be  given through the PICC to help break up the clot.  Inflammation of the vein (phlebitis) in which the PICC is placed. Signs of inflammation may include redness, pain at the insertion site, red streaks, or being able to feel a "cord" in the vein where the PICC is located.  Infection in the PICC or at the insertion site. Signs of infection may include fever, chills, redness, swelling, or pus drainage from the PICC insertion site.  PICC movement (malposition). The PICC tip may move from its original position due to excessive physical activity, forceful coughing, sneezing, or vomiting.  A break or cut in the PICC. It is important to not use scissors near the PICC.  Nerve or tendon irritation or injury during PICC insertion. WHAT SHOULD I KEEP IN MIND ABOUT ACTIVITIES WHEN I HAVE A PICC?  You may bend your arm and move it freely. If your PICC is near or at the bend of your elbow, avoid activity with repeated motion at the elbow.  Rest at home for the remainder of the day  following PICC line insertion.  Avoid lifting heavy objects as instructed by your health care provider.  Avoid using a crutch with the arm on the same side as your PICC. You may need to use a walker. WHAT SHOULD I KNOW ABOUT MY PICC DRESSING?  Keep your PICC bandage (dressing) clean and dry to prevent infection.  Ask your health care provider when you may shower. Ask your health care provider to teach you how to wrap the PICC when you do take a shower.  Change the PICC dressing as instructed by your health care provider.  Change your PICC dressing if it becomes loose or wet. WHAT SHOULD I KNOW ABOUT PICC CARE?  Check the PICC insertion site daily for leakage, redness, swelling, or pain.  Do not take a bath, swim, or use hot tubs when you have a PICC. Cover PICC line with clear plastic wrap and tape to keep it dry while showering.  Flush the PICC as directed by your health care provider. Let your health care provider know  right away if the PICC is difficult to flush or does not flush. Do not use force to flush the PICC.  Do not use a syringe that is less than 10 mL to flush the PICC.  Never pull or tug on the PICC.  Avoid blood pressure checks on the arm with the PICC.  Keep your PICC identification card with you at all times.  Do not take the PICC out yourself. Only a trained clinical professional should remove the PICC. SEEK IMMEDIATE MEDICAL CARE IF:  Your PICC is accidentally pulled all the way out. If this happens, cover the insertion site with a bandage or gauze dressing. Do not throw the PICC away. Your health care provider will need to inspect it.  Your PICC was tugged or pulled and has partially come out. Do not  push the PICC back in.  There is any type of drainage, redness, or swelling where the PICC enters the skin.  You cannot flush the PICC, it is difficult to flush, or the PICC leaks around the insertion site when it is flushed.  You hear a "flushing" sound when the PICC is flushed.  You have pain, discomfort, or numbness in your arm, shoulder, or jaw on the same side as the PICC.  You feel your heart "racing" or skipping beats.  You notice a hole or tear in the PICC.  You develop chills or a fever. MAKE SURE YOU:   Understand these instructions.  Will watch your condition.  Will get help right away if you are not doing well or get worse.   This information is not intended to replace advice given to you by your health care provider. Make sure you discuss any questions you have with your health care provider.   Document Released: 01/19/2003 Document Revised: 08/05/2014 Document Reviewed: 03/22/2013 Elsevier Interactive Patient Education [employer].

## 2016-03-15 ENCOUNTER — Ambulatory Visit (HOSPITAL_COMMUNITY): Payer: Medicare Other

## 2016-03-15 ENCOUNTER — Encounter (HOSPITAL_BASED_OUTPATIENT_CLINIC_OR_DEPARTMENT_OTHER): Payer: Medicare Other | Admitting: Oncology

## 2016-03-15 ENCOUNTER — Encounter (HOSPITAL_COMMUNITY): Payer: Self-pay | Admitting: Oncology

## 2016-03-15 ENCOUNTER — Encounter (HOSPITAL_BASED_OUTPATIENT_CLINIC_OR_DEPARTMENT_OTHER): Payer: Medicare Other

## 2016-03-15 ENCOUNTER — Inpatient Hospital Stay (HOSPITAL_COMMUNITY): Payer: Medicare Other

## 2016-03-15 VITALS — BP 117/52 | HR 70 | Temp 97.6°F | Resp 18 | Wt 217.8 lb

## 2016-03-15 DIAGNOSIS — R918 Other nonspecific abnormal finding of lung field: Secondary | ICD-10-CM | POA: Diagnosis not present

## 2016-03-15 DIAGNOSIS — C50412 Malignant neoplasm of upper-outer quadrant of left female breast: Secondary | ICD-10-CM

## 2016-03-15 DIAGNOSIS — Z9889 Other specified postprocedural states: Secondary | ICD-10-CM | POA: Diagnosis not present

## 2016-03-15 DIAGNOSIS — Z5111 Encounter for antineoplastic chemotherapy: Secondary | ICD-10-CM

## 2016-03-15 DIAGNOSIS — F329 Major depressive disorder, single episode, unspecified: Secondary | ICD-10-CM | POA: Diagnosis not present

## 2016-03-15 DIAGNOSIS — I10 Essential (primary) hypertension: Secondary | ICD-10-CM | POA: Diagnosis not present

## 2016-03-15 DIAGNOSIS — C50912 Malignant neoplasm of unspecified site of left female breast: Secondary | ICD-10-CM | POA: Diagnosis not present

## 2016-03-15 DIAGNOSIS — Z5189 Encounter for other specified aftercare: Secondary | ICD-10-CM | POA: Diagnosis not present

## 2016-03-15 DIAGNOSIS — R739 Hyperglycemia, unspecified: Secondary | ICD-10-CM

## 2016-03-15 DIAGNOSIS — F17210 Nicotine dependence, cigarettes, uncomplicated: Secondary | ICD-10-CM | POA: Diagnosis not present

## 2016-03-15 DIAGNOSIS — F419 Anxiety disorder, unspecified: Secondary | ICD-10-CM | POA: Diagnosis not present

## 2016-03-15 LAB — COMPREHENSIVE METABOLIC PANEL
ALBUMIN: 2.9 g/dL — AB (ref 3.5–5.0)
ALK PHOS: 56 U/L (ref 38–126)
ALT: 28 U/L (ref 14–54)
AST: 26 U/L (ref 15–41)
Anion gap: 9 (ref 5–15)
BILIRUBIN TOTAL: 0.5 mg/dL (ref 0.3–1.2)
BUN: 8 mg/dL (ref 6–20)
CO2: 19 mmol/L — ABNORMAL LOW (ref 22–32)
CREATININE: 0.67 mg/dL (ref 0.44–1.00)
Calcium: 8.9 mg/dL (ref 8.9–10.3)
Chloride: 97 mmol/L — ABNORMAL LOW (ref 101–111)
GFR calc Af Amer: >60 mL/min (ref >60)
Glucose, Bld: 399 mg/dL — ABNORMAL HIGH (ref 65–99)
POTASSIUM: 5 mmol/L (ref 3.5–5.1)
SODIUM: 125 mmol/L — AB (ref 135–145)
TOTAL PROTEIN: 6 g/dL — AB (ref 6.5–8.1)

## 2016-03-15 LAB — CBC WITH DIFFERENTIAL/PLATELET
BASOS PCT: 0 %
Basophils Absolute: 0 10*3/uL (ref 0.0–0.1)
EOS ABS: 0 10*3/uL (ref 0.0–0.7)
Eosinophils Relative: 0 %
HCT: 33 % — ABNORMAL LOW (ref 36.0–46.0)
HEMOGLOBIN: 11.1 g/dL — AB (ref 12.0–15.0)
Lymphocytes Relative: 6 %
Lymphs Abs: 0.5 10*3/uL — ABNORMAL LOW (ref 0.7–4.0)
MCH: 29.9 pg (ref 26.0–34.0)
MCHC: 33.6 g/dL (ref 30.0–36.0)
MCV: 88.9 fL (ref 78.0–100.0)
Monocytes Absolute: 0.6 10*3/uL (ref 0.1–1.0)
Monocytes Relative: 7 %
NEUTROS PCT: 87 %
Neutro Abs: 7.7 10*3/uL (ref 1.7–7.7)
Platelets: 213 10*3/uL (ref 150–400)
RBC: 3.71 MIL/uL — AB (ref 3.87–5.11)
RDW: 18.5 % — ABNORMAL HIGH (ref 11.5–15.5)
WBC: 8.8 10*3/uL (ref 4.0–10.5)

## 2016-03-15 MED ORDER — INSULIN ASPART 100 UNIT/ML ~~LOC~~ SOLN
8.0000 [IU] | Freq: Once | SUBCUTANEOUS | Status: AC
  Administered 2016-03-15: 8 [IU] via SUBCUTANEOUS
  Filled 2016-03-15: qty 0.08

## 2016-03-15 MED ORDER — PALONOSETRON HCL INJECTION 0.25 MG/5ML
0.2500 mg | Freq: Once | INTRAVENOUS | Status: AC
  Administered 2016-03-15: 0.25 mg via INTRAVENOUS

## 2016-03-15 MED ORDER — PEGFILGRASTIM 6 MG/0.6ML ~~LOC~~ PSKT
PREFILLED_SYRINGE | SUBCUTANEOUS | Status: AC
  Filled 2016-03-15: qty 0.6

## 2016-03-15 MED ORDER — SODIUM CHLORIDE 0.9 % IV SOLN
600.0000 mg/m2 | Freq: Once | INTRAVENOUS | Status: AC
  Administered 2016-03-15: 1320 mg via INTRAVENOUS
  Filled 2016-03-15: qty 16

## 2016-03-15 MED ORDER — PEGFILGRASTIM 6 MG/0.6ML ~~LOC~~ PSKT
6.0000 mg | PREFILLED_SYRINGE | Freq: Once | SUBCUTANEOUS | Status: AC
  Administered 2016-03-15: 6 mg via SUBCUTANEOUS

## 2016-03-15 MED ORDER — PALONOSETRON HCL INJECTION 0.25 MG/5ML
INTRAVENOUS | Status: AC
  Filled 2016-03-15: qty 5

## 2016-03-15 MED ORDER — SODIUM CHLORIDE 0.9% FLUSH: 10.0000 mL | INTRAVENOUS | Status: DC | PRN

## 2016-03-15 MED ORDER — SODIUM CHLORIDE 0.9 % IV SOLN
10.0000 mg | Freq: Once | INTRAVENOUS | Status: AC
  Administered 2016-03-15: 10 mg via INTRAVENOUS
  Filled 2016-03-15: qty 1

## 2016-03-15 MED ORDER — SODIUM CHLORIDE 0.9 % IV SOLN
Freq: Once | INTRAVENOUS | Status: AC
  Administered 2016-03-15: 10:00:00 via INTRAVENOUS

## 2016-03-15 MED ORDER — HEPARIN SOD (PORK) LOCK FLUSH 100 UNIT/ML IV SOLN: 500.0000 [IU] | Freq: Once | INTRAVENOUS | Status: DC | PRN

## 2016-03-15 MED ORDER — DOCETAXEL CHEMO INJECTION 160 MG/16ML
67.0000 mg/m2 | Freq: Once | INTRAVENOUS | Status: AC
  Administered 2016-03-15: 150 mg via INTRAVENOUS
  Filled 2016-03-15: qty 15

## 2016-03-15 NOTE — Progress Notes (Signed)
Taylor Burly, MD [place] [address]  Carcinoma of upper-outer quadrant of left female breast [facility])  Pulmonary nodules  Hyperglycemia - Plan: insulin aspart (novoLOG) injection 8 Units, Glucose, random  CURRENT THERAPY: TC beginning on 01/12/2016  INTERVAL HISTORY: Taylor Williams 65 y.o. female returns for followup of Stage IA (T1BN0M0) left invasive breast cancer, triple negative, 0.8 cm in size, S/P left lumpectomy by Dr. Ladona Horns on 11/30/2015.    Carcinoma of upper-outer quadrant of left female breast ([place])   11/30/2015 Surgery    Lumpectomy and sentinel node procedure with Dr. Ladona Horns      11/30/2015 Surgery    Needle localization of the L breast with mammo guidance      11/30/2015 Pathology Results    L breast invasive ductal carcinoma, mod diff, 0.8 cm, focal DCIS, high grade, 2/2 LN negative, no LVI, pT1b, pN0M0, ER- PR- HER2 - Ki-67 92%      01/12/2016 - 03/15/2016 Chemotherapy    TC x 4 cycles      02/01/2016 Imaging    CT chest screening- Lung-RADS Category 4B, suspicious. Additional imaging evaluation or consultation with pulmonary medicine or thoracic surgery recommended. Bilateral pulmonary nodules, including an inferior right upper lobe spiculated 11 mm nodule.       03/13/2016 Procedure    Port removed by Dr. Ladona Horns due to concerns for infection.      03/14/2016 Procedure    PICC line placed       Patient had her port removed by Dr. Ladona Horns on Wednesday due to concern for infection.  PICC line was placed her at [facility] yesterday AM.  We will have it removed today.   She denies any complaints today and is excited to finish chemotherapy.  Review of Systems  Constitutional: Negative for chills, fever, malaise/fatigue and weight loss.  HENT: Negative.   Eyes: Negative.  Negative for blurred vision and double vision.  Respiratory: Negative.  Negative for cough and shortness of breath.   Cardiovascular: Positive for leg swelling.  Negative for chest pain.  Gastrointestinal: Negative for abdominal pain.  Genitourinary: Negative.   Musculoskeletal: Negative.   Skin: Negative.  Negative for itching and rash.  Neurological: Negative.  Negative for weakness and headaches.  Endo/Heme/Allergies: Negative.   Psychiatric/Behavioral: Negative.     Past Medical History:  Diagnosis Date  . Anxiety   . Depression   . High cholesterol   . Hypertension     Past Surgical History:  Procedure Laterality Date  . BREAST LUMPECTOMY Left    states for ductal carcinoma, had surgery in late may    History reviewed. No pertinent family history.  Social History   Social History  . Marital status: Widowed    Spouse name: N/A  . Number of children: N/A  . Years of education: N/A   Social History Main Topics  . Smoking status: Current Every Day Smoker    Packs/day: 1.00    Years: 40.00  . Smokeless tobacco: Never Used  . Alcohol use No  . Drug use: No  . Sexual activity: Not Asked   Other Topics Concern  . None   Social History Narrative  . None     PHYSICAL EXAMINATION  ECOG PERFORMANCE STATUS: 1 - Symptomatic but completely ambulatory  There were no vitals filed for this visit.  BP 129/64 P 74 R 18 T 98.5 O2 Sat 99% on RA  GENERAL:alert, no distress, well nourished,  well developed, comfortable, cooperative, obese, smiling and accompanied by friend, in chemo-recliner. SKIN: skin color, texture, turgor are normal, no rashes or significant lesions HEAD: Normocephalic, No masses, lesions, tenderness or abnormalities EYES: normal, EOMI, Conjunctiva are pink and non-injected EARS: External ears normal OROPHARYNX:no exudate, no erythema, lips, buccal mucosa, and tongue normal and mucous membranes are moist  NECK: supple, trachea midline LYMPH:  not examined BREAST:not examined LUNGS: clear to auscultation and percussion HEART: regular rate & rhythm ABDOMEN:abdomen soft, obese and normal bowel  sounds BACK: Back symmetric, no curvature. EXTREMITIES:less then 2 second capillary refill, no joint deformities, effusion, or inflammation, no skin discoloration.  B/L LE edema, 1 + pitting without heat.  PICC in right arm. NEURO: alert & oriented x 3 with fluent speech, no focal motor/sensory deficits, in wheelchair   LABORATORY DATA: CBC    Component Value Date/Time   WBC 8.8 03/15/2016 0910   RBC 3.71 (L) 03/15/2016 0910   HGB 11.1 (L) 03/15/2016 0910   HCT 33.0 (L) 03/15/2016 0910   PLT 213 03/15/2016 0910   MCV 88.9 03/15/2016 0910   MCH 29.9 03/15/2016 0910   MCHC 33.6 03/15/2016 0910   RDW 18.5 (H) 03/15/2016 0910   LYMPHSABS 0.5 (L) 03/15/2016 0910   MONOABS 0.6 03/15/2016 0910   EOSABS 0.0 03/15/2016 0910   BASOSABS 0.0 03/15/2016 0910      Chemistry      Component Value Date/Time   NA 125 (L) 03/15/2016 0910   K 5.0 03/15/2016 0910   CL 97 (L) 03/15/2016 0910   CO2 19 (L) 03/15/2016 0910   BUN 8 03/15/2016 0910   CREATININE 0.67 03/15/2016 0910      Component Value Date/Time   CALCIUM 8.9 03/15/2016 0910   ALKPHOS 56 03/15/2016 0910   AST 26 03/15/2016 0910   ALT 28 03/15/2016 0910   BILITOT 0.5 03/15/2016 0910        PENDING LABS:   RADIOGRAPHIC STUDIES:  No results found.   PATHOLOGY:    ASSESSMENT AND PLAN:  Carcinoma of upper-outer quadrant of left female breast ([place]) Stage IA (T1BN0M0) left invasive breast cancer, triple negative, 0.8 cm in size, S/P left lumpectomy by Dr. Ladona Horns on 11/30/2015. TC adjuvant chemotherapy began on 01/12/2016.  Oncology history updated.  Pre-treatment labs: CBC diff, CMET.  I personally reviewed and went over laboratory results with the patient.  The results are noted within this dictation.  Labs meet treatment criteria.  K+ is WNL, so I will ask her to hold [place].  Hyperglycemia noted.  She notes noncompliance to her DM medications this AM.  8 units of insulin is ordered and administered with a repeat glucose in  1 hour.  Port was removed by Dr. Ladona Horns on Wednesday, 03/13/2016, due to concern of infection.  She subsequently underwent PICC line placement yesterday AM.  We will have PICC line removed today following treatment.  Order is placed for PET imaging in ~ 2 weeks to evaluate her concerning CT chest screening findings.  Smoking cessation strongly encouraged.  She is working on behavioral modification.  She has met with XRT and they are prepared to embark on XRT for breast cancer.  Return in 3 weeks for follow-up and review of PET scan imaging results.  Further medical oncology recommendations will follow.   ORDERS PLACED FOR THIS ENCOUNTER: Orders Placed This Encounter  Procedures  . Glucose, random    MEDICATIONS PRESCRIBED THIS ENCOUNTER: Meds ordered this encounter  Medications  . insulin aspart (  novoLOG) injection 8 Units    THERAPY PLAN:  Continue with treatment as planned.  All questions were answered. The patient knows to call the clinic with any problems, questions or concerns. We can certainly see the patient much sooner if necessary.  Patient and plan discussed with Dr. Ancil Linsey and she is in agreement with the aforementioned.   This note is electronically signed by: Doy Mince 03/15/2016 5:19 PM

## 2016-03-15 NOTE — Patient Instructions (Addendum)
[place] at [facility] Discharge Instructions  RECOMMENDATIONS MADE BY THE CONSULTANT AND ANY TEST RESULTS WILL BE SENT TO YOUR REFERRING PHYSICIAN.  You saw Kirby Crigler, PA-C, today. You had treatment today. PET scan as scheduled. Stop taking potassium until you are told otherwise.  Thank you for choosing [place] at [facility] to provide your oncology and hematology care.  To afford each patient quality time with our provider, please arrive at least 15 minutes before your scheduled appointment time.   Beginning January 23rd 2017 lab work for the [facility] will be done in the  Main lab at [facility] on 1st floor. If you have a lab appointment with the [facility] please come in thru the  Main Entrance and check in at the main information desk  You need to re-schedule your appointment should you arrive 10 or more minutes late.  We strive to give you quality time with our providers, and arriving late affects you and other patients whose appointments are after yours.  Also, if you no show three or more times for appointments you may be dismissed from the clinic at the providers discretion.     Again, thank you for choosing [facility].  Our hope is that these requests will decrease the amount of time that you wait before being seen by our physicians.       _____________________________________________________________  Should you have questions after your visit to [facility], please contact our office at [phone] between the hours of 8:30 a.m. and 4:30 p.m.  Voicemails left after 4:30 p.m. will not be returned until the following business day.  For prescription refill requests, have your pharmacy contact our office.         Resources For Cancer Patients and their Caregivers ? American Cancer Society: Can assist with transportation, wigs, general needs, runs Look Good Feel Better.         ([phone] ? Cancer Care: Provides financial assistance, online support groups, medication/co-pay assistance.  1-800-813-HOPE 657-041-2817) ? [facility] Assists [place] cancer patients and their families through emotional , educational and financial support.  947 648 0998 ? [place] DSS Where to apply for food stamps, Medicaid and utility assistance. (484)630-4914 ? RCATS: Transportation to medical appointments. (281) 480-7201 ? Social Security Administration: May apply for disability if have a Stage IV cancer. ([phone] 3182458061 ? [employer], Disability and Transit Services: Assists with nutrition, care and transit needs. [place] Support Programs: @10RELATIVEDAYS @ > Cancer Support Group  2nd Tuesday of the month 1pm-2pm, Journey Room  > Creative Journey  3rd Tuesday of the month 1130am-1pm, Journey Room  > Look Good Feel Better  1st Wednesday of the month 10am-12 noon, Journey Room (Call [facility] to register [phone])

## 2016-03-15 NOTE — Assessment & Plan Note (Addendum)
Stage IA (T1BN0M0) left invasive breast cancer, triple negative, 0.8 cm in size, S/P left lumpectomy by Dr. Cathey on 11/30/2015. TC adjuvant chemotherapy began on 01/12/2016.  Oncology history updated.  Pre-treatment labs: CBC diff, CMET.  I personally reviewed and went over laboratory results with the patient.  The results are noted within this dictation.  Labs meet treatment criteria.  K+ is WNL, so I will ask her to hold Kd[place].  Hyperglycemia noted.  She notes noncompliance to her DM medications this AM.  8 units of insulin is ordered and administered with a repeat glucose in 1 hour.  Port was removed by Dr. Cathey on Wednesday, 03/13/2016, due to concern of infection.  She subsequently underwent PICC line placement yesterday AM.  We will have PICC line removed today following treatment.  Order is placed for PET imaging in ~ 2 weeks to evaluate her concerning CT chest screening findings.  Smoking cessation strongly encouraged.  She is working on behavioral modification.  She has met with XRT and they are prepared to embark on XRT for breast cancer.  Return in 3 weeks for follow-up and review of PET scan imaging results.  Further medical oncology recommendations will follow. 

## 2016-03-15 NOTE — Patient Instructions (Signed)
[facility] Discharge Instructions for Patients Receiving Chemotherapy   Beginning January 23rd 2017 lab work for the [facility] will be done in the  Main lab at [facility] on 1st floor. If you have a lab appointment with the [facility] please come in thru the  Main Entrance and check in at the main information desk   Today you received the following chemotherapy agents: Cytoxan and Taxotere today. PICC removal today.   PICC Removal, Care After Refer to this sheet in the next few weeks. These instructions provide you with information on caring for yourself after your procedure. Your health care provider may also give you more specific instructions. Your treatment has been planned according to current medical practices, but problems sometimes occur. Call your health care provider if you have any problems or questions after your procedure. WHAT TO EXPECT AFTER THE PROCEDURE After your procedure, it is typical to have mild discomfort at the insertion site. This should not last for more than a day. HOME CARE INSTRUCTIONS You may remove the bandage after 24 hours. The PICC insertion site is very small. A small scab may develop over the insertion site.  It is okay to wash the site gently with soap and water. Be careful not to remove or pick off the scab. Gently pat the site dry after washing it. You do not need to put another bandage over the insertion site. Do not lift anything heavy or do strenuous physical activity for 24 hours after the PICC is removed. This includes:  Weight lifting.  Strenuous yard work.  Any physical activity with repetitive arm movement.  SEEK MEDICAL CARE IF:   You have swelling or puffiness in your arm at the PICC insertion site.  You have increasing tenderness at the PICC insertion site. SEEK IMMEDIATE MEDICAL CARE IF:   You have numbness or tingling in your fingers, hand, or arm.  Your arm looks blue and feels cold.  You have redness  around the insertion site or a red streak goes up your arm.  You have any type of drainage from the PICC insertion site. This includes drainage such as:  Bleeding from the insertion site. If this happens, apply firm, direct pressure to the PICC insertion site with a clean towel.  Drainage that is yellow or tan.  You have a fever.   This information is not intended to replace advice given to you by your health care provider. Make sure you discuss any questions you have with your health care provider.   Document Released: 07/20/2013 Document Revised: 08/05/2014 Document Reviewed: 07/20/2013 Elsevier Interactive Patient Education [employer].    If you develop nausea and vomiting, or diarrhea that is not controlled by your medication, call the clinic.  The clinic phone number is [phone]. Office hours are Monday-Friday 8:30am-5:00pm.  BELOW ARE SYMPTOMS THAT SHOULD BE REPORTED IMMEDIATELY:  *FEVER GREATER THAN 101.0 F  *CHILLS WITH OR WITHOUT FEVER  NAUSEA AND VOMITING THAT IS NOT CONTROLLED WITH YOUR NAUSEA MEDICATION  *UNUSUAL SHORTNESS OF BREATH  *UNUSUAL BRUISING OR BLEEDING  TENDERNESS IN MOUTH AND THROAT WITH OR WITHOUT PRESENCE OF ULCERS  *URINARY PROBLEMS  *BOWEL PROBLEMS  UNUSUAL RASH Items with * indicate a potential emergency and should be followed up as soon as possible. If you have an emergency after office hours please contact your primary care physician or go to the nearest emergency department.  Please call the clinic during office hours if you have any  questions or concerns.   You may also contact the Patient Navigator at [phone] should you have any questions or need assistance in obtaining follow up care.      Resources For Cancer Patients and their Caregivers ? American Cancer Society: Can assist with transportation, wigs, general needs, runs Look Good Feel Better.        ([phone] [phone] ? Cancer Care: Provides financial  assistance, online support groups, medication/co-pay assistance.  1-800-813-HOPE (504)616-8070) ? [place] Assists [place] cancer patients and their families through emotional , educational and financial support.  (479) 666-0740 ? [place] DSS Where to apply for food stamps, Medicaid and utility assistance. (820)790-4989 ? RCATS: Transportation to medical appointments. (720)807-5115 ? Social Security Administration: May apply for disability if have a Stage IV cancer. 803-211-1947 215-047-9717 ? [employer], Disability and Transit Services: Assists with nutrition, care and transit needs. [phone]

## 2016-03-15 NOTE — Progress Notes (Signed)
PICC line to right arm removed today via sterile technique.  No active bleeding.  No distress.  Pressure dressing applied.  Patient educated on after care of PICC line removal and instructions also printed off and shown to patient.  Patient observed for 30 minutes post removal and was stable upon discharge.  Ambulatory out of clinic.  VSS.

## 2016-04-02 DIAGNOSIS — Z510 Encounter for antineoplastic radiation therapy: Secondary | ICD-10-CM | POA: Diagnosis not present

## 2016-04-02 DIAGNOSIS — E119 Type 2 diabetes mellitus without complications: Secondary | ICD-10-CM | POA: Diagnosis not present

## 2016-04-02 DIAGNOSIS — R600 Localized edema: Secondary | ICD-10-CM | POA: Diagnosis not present

## 2016-04-02 DIAGNOSIS — F17200 Nicotine dependence, unspecified, uncomplicated: Secondary | ICD-10-CM | POA: Diagnosis not present

## 2016-04-02 DIAGNOSIS — C50412 Malignant neoplasm of upper-outer quadrant of left female breast: Secondary | ICD-10-CM | POA: Diagnosis not present

## 2016-04-03 ENCOUNTER — Encounter (HOSPITAL_COMMUNITY): Payer: Self-pay | Admitting: Radiology

## 2016-04-03 ENCOUNTER — Ambulatory Visit (HOSPITAL_COMMUNITY)
Admission: RE | Admit: 2016-04-03 | Discharge: 2016-04-03 | Disposition: A | Discharge status: Home / Self Care | Payer: Medicare Other | Source: Ambulatory Visit | Attending: Oncology | Admitting: Oncology

## 2016-04-03 DIAGNOSIS — I700 Atherosclerosis of aorta: Secondary | ICD-10-CM | POA: Diagnosis not present

## 2016-04-03 DIAGNOSIS — I2510 Atherosclerotic heart disease of native coronary artery without angina pectoris: Secondary | ICD-10-CM | POA: Insufficient documentation

## 2016-04-03 DIAGNOSIS — R918 Other nonspecific abnormal finding of lung field: Secondary | ICD-10-CM | POA: Diagnosis not present

## 2016-04-03 DIAGNOSIS — R911 Solitary pulmonary nodule: Secondary | ICD-10-CM | POA: Diagnosis not present

## 2016-04-03 LAB — GLUCOSE, CAPILLARY: GLUCOSE-CAPILLARY: 116 mg/dL — AB (ref 65–99)

## 2016-04-03 MED ORDER — FLUDEOXYGLUCOSE F - 18 (FDG) INJECTION: 10.7000 | Freq: Once | INTRAVENOUS | Status: DC | PRN

## 2016-04-04 ENCOUNTER — Encounter (HOSPITAL_COMMUNITY): Payer: Medicare Other | Attending: Hematology & Oncology | Admitting: Hematology & Oncology

## 2016-04-04 ENCOUNTER — Encounter (HOSPITAL_COMMUNITY): Payer: Self-pay | Admitting: Hematology & Oncology

## 2016-04-04 VITALS — BP 101/61 | HR 73 | Temp 97.9°F | Resp 16 | Wt 218.8 lb

## 2016-04-04 DIAGNOSIS — R911 Solitary pulmonary nodule: Secondary | ICD-10-CM

## 2016-04-04 DIAGNOSIS — I10 Essential (primary) hypertension: Secondary | ICD-10-CM | POA: Insufficient documentation

## 2016-04-04 DIAGNOSIS — Z9889 Other specified postprocedural states: Secondary | ICD-10-CM | POA: Insufficient documentation

## 2016-04-04 DIAGNOSIS — R229 Localized swelling, mass and lump, unspecified: Secondary | ICD-10-CM | POA: Diagnosis not present

## 2016-04-04 DIAGNOSIS — Z720 Tobacco use: Secondary | ICD-10-CM

## 2016-04-04 DIAGNOSIS — C50412 Malignant neoplasm of upper-outer quadrant of left female breast: Secondary | ICD-10-CM

## 2016-04-04 DIAGNOSIS — Z171 Estrogen receptor negative status [ER-]: Secondary | ICD-10-CM

## 2016-04-04 DIAGNOSIS — C50912 Malignant neoplasm of unspecified site of left female breast: Secondary | ICD-10-CM | POA: Insufficient documentation

## 2016-04-04 DIAGNOSIS — C50919 Malignant neoplasm of unspecified site of unspecified female breast: Secondary | ICD-10-CM

## 2016-04-04 DIAGNOSIS — F329 Major depressive disorder, single episode, unspecified: Secondary | ICD-10-CM | POA: Insufficient documentation

## 2016-04-04 DIAGNOSIS — F419 Anxiety disorder, unspecified: Secondary | ICD-10-CM | POA: Insufficient documentation

## 2016-04-04 DIAGNOSIS — F17210 Nicotine dependence, cigarettes, uncomplicated: Secondary | ICD-10-CM | POA: Insufficient documentation

## 2016-04-04 DIAGNOSIS — Z79899 Other long term (current) drug therapy: Secondary | ICD-10-CM | POA: Insufficient documentation

## 2016-04-04 DIAGNOSIS — K7460 Unspecified cirrhosis of liver: Secondary | ICD-10-CM | POA: Diagnosis not present

## 2016-04-04 DIAGNOSIS — R918 Other nonspecific abnormal finding of lung field: Secondary | ICD-10-CM

## 2016-04-04 DIAGNOSIS — F17200 Nicotine dependence, unspecified, uncomplicated: Secondary | ICD-10-CM

## 2016-04-04 DIAGNOSIS — Z7984 Long term (current) use of oral hypoglycemic drugs: Secondary | ICD-10-CM | POA: Insufficient documentation

## 2016-04-04 MED ORDER — ONDANSETRON HCL 8 MG PO TABS: 8.0000 mg | ORAL_TABLET | Freq: Three times a day (TID) | ORAL | 2 refills | Status: DC | PRN

## 2016-04-04 MED ORDER — NICOTINE POLACRILEX 4 MG MT GUM: 4.0000 mg | CHEWING_GUM | OROMUCOSAL | 0 refills | Status: DC | PRN

## 2016-04-04 NOTE — Progress Notes (Signed)
Taylor Williams  Progress Note  Patient Care Team: Taylor Burly, MD as PCP - General (Internal Medicine)  CHIEF COMPLAINTS:    Carcinoma of upper-outer quadrant of left female breast ([place])   11/30/2015 Surgery    Lumpectomy and sentinel node procedure with Dr. Ladona Horns      11/30/2015 Surgery    Needle localization of the L breast with mammo guidance      11/30/2015 Pathology Results    L breast invasive ductal carcinoma, mod diff, 0.8 cm, focal DCIS, high grade, 2/2 LN negative, no LVI, pT1b, pN0M0, ER- PR- HER2 - Ki-67 92%      01/12/2016 - 03/15/2016 Chemotherapy    TC x 4 cycles      02/01/2016 Imaging    CT chest screening- Lung-RADS Category 4B, suspicious. Additional imaging evaluation or consultation with pulmonary medicine or thoracic surgery recommended. Bilateral pulmonary nodules, including an inferior right upper lobe spiculated 11 mm nodule.       03/13/2016 Procedure    Port removed by Dr. Ladona Horns due to concerns for infection.      03/14/2016 Procedure    PICC line placed      04/03/2016 PET scan    Majority of visualized pulmonary nodules are too small for PET resolution. Largest nodule in the right upper lobe is likely stable and does not show metabolism above blood pool. Follow-up CT chest without contrast in 3 months is recommended in further evaluation, to document stability, as malignancy cannot be excluded.Hypermetabolic subcutaneous nodule along the medial right shoulder. This the should be amenable to direct Visualization/palpation. Mild hypermetabolism associated with the left breast and left axilla, likely postoperative in etiology. Please correlate clinically. Aortic atherosclerosis and coronary artery calcification.Cirrhosis.       HISTORY OF PRESENTING ILLNESS:  Taylor Williams 65 y.o. female is here for follow-up of stage I triple negative carcinoma of the L breast. She has completed 4 cycles of TC.   Patient says she attended a class to help  her quit smoking on Tuesday. She did not feel that the class helped much. The class recommended she switch to a lower nicotine brand cigarette which Taylor Williams intends to do that his week.    She says she is motivated to quit smoking, especially after Dr. Isidore Williams told her that smoking can interfere with radiation. She said that by 04/16/2016 she intends to be a non-smoker.   Patient says her port site is doing ok. Port has been removed.   Patient says that the lat time she had chemo, her diarrhea wasn't as bad. However, she felt very fatigued and her knees felt weak when she attempted to walk. She said she also was not able to eat as much as she should have been so she tried to push fluids. She described these few days as miserable. Nykira says she is now beginning to get her energy back.  Patient denies headaches or swelling in her fingers. She says food is still not tasting better. However, her bowels are back to normal.  She asked if she could still use phenergran.She also asked how long the after taste of chemo will last and if she should wait until after radiation to get a colonoscopy.  Biddie needs a refill on zofran.  She has already been tattooed and was thinking to start radiation 9/18 or 9/19.   MEDICAL HISTORY:  Past Medical History:  Diagnosis Date  . Anxiety   . Depression   . High cholesterol   .  Hypertension     SURGICAL HISTORY: Past Surgical History:  Procedure Laterality Date  . BREAST LUMPECTOMY Left    states for ductal carcinoma, had surgery in late may    SOCIAL HISTORY: Social History   Social History  . Marital status: Widowed    Spouse name: N/A  . Number of children: N/A  . Years of education: N/A   Occupational History  . Not on file.   Social History Main Topics  . Smoking status: Current Every Day Smoker    Packs/day: 1.00    Years: 40.00  . Smokeless tobacco: Never Used  . Alcohol use No  . Drug use: No  . Sexual activity: Not on file   Other  Topics Concern  . Not on file   Social History Narrative  . No narrative on file  Widowed for 40 years. Her husband was killed in an MVA. He was in the army and they were living in [place] at the time. No children. She never remarried. She has a close family, a close great nephew. Born in [place]. 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  Smoker cigarettes 1ppd for greater than 40 years.  No ETOH. Retired. Enjoys Haematologist, Barrister's clerk, volunteers at [employer].   FAMILY HISTORY: History reviewed. No pertinent family history.  Mother deceased at 14 from colon cancer Father deceased at 77 from complications of COPD, dementia One Brother healthy Maternal brother deceased from esophageal cancer Paternal grandmother had colon cancer and grandfather had lung cancer  ALLERGIES:  is allergic to aspirin; other; and fish allergy.  MEDICATIONS:  Current Outpatient Prescriptions  Medication Sig Dispense Refill  . ALPRAZolam (XANAX) 0.25 MG tablet Take 0.25 mg by mouth 2 (two) times daily.    . cetirizine (ZYRTEC) 10 MG tablet Take 10 mg by mouth daily.    . citalopram (CELEXA) 20 MG tablet Take 20 mg by mouth daily.    . Diphenhyd-Hydrocort-Nystatin (FIRST-DUKES MOUTHWASH) SUSP Use as directed 5 mLs in the mouth or throat 4 (four) times daily as needed. 420 mL 2  . diphenoxylate-atropine (LOMOTIL) 2.5-0.025 MG tablet May take 1-2 tabs four times a day prn loose stools. 60 tablet 1  . DOCEtaxel (TAXOTERE IV) Inject into the vein. Has not started yet - to be given every 21 days x 4 cycles    . glipiZIDE (GLUCOTROL) 10 MG tablet Take 10 mg by mouth 2 (two) times daily before a meal.    . lidocaine-prilocaine (EMLA) cream Apply a quarter size amount to port site 1 hour prior to chemo. Do not rub in. Cover with plastic wrap. 30 g 3  . lisinopril-hydrochlorothiazide (PRINZIDE,ZESTORETIC) 20-12.5 MG tablet Take 1 tablet by mouth every other day.     . loperamide  (IMODIUM A-D) 2 MG tablet Take 4 mg by mouth 2 (two) times daily.    Marland Kitchen LORazepam (ATIVAN) 0.5 MG tablet Take 1 tablet (0.5 mg total) by mouth every 4 (four) hours as needed for anxiety. Or as needed for nausea 60 tablet 0  . metFORMIN (GLUCOPHAGE) 1000 MG tablet Take 1,000 mg by mouth 2 (two) times daily with a meal.    . omega-3 acid ethyl esters (LOVAZA) 1 g capsule Take 1,500 mg by mouth 2 (two) times daily.    . ondansetron (ZOFRAN) 8 MG tablet Take 1 tablet (8 mg total) by mouth every 8 (eight) hours as needed for nausea or vomiting. 30 tablet 2  . prochlorperazine (COMPAZINE)  10 MG tablet Take 1 tablet (10 mg total) by mouth every 6 (six) hours as needed for nausea or vomiting. 30 tablet 2  . promethazine (PHENERGAN) 25 MG tablet Take 1 tablet (25 mg total) by mouth every 6 (six) hours as needed for nausea or vomiting. 45 tablet 0  . sitaGLIPtin (JANUVIA) 100 MG tablet Take 100 mg by mouth daily.    . nicotine polacrilex (NICORETTE) 4 MG gum Take 1 each (4 mg total) by mouth as needed for smoking cessation. 100 tablet 0  . Pegfilgrastim (NEULASTA DELIVERY KIT [place]) Inject into the skin. Has not started yet - to be given every 21 days x 4 cycles (after completing chemo)    . potassium chloride SA (K-DUR,KLOR-CON) 20 MEQ tablet Take 2 tablets (40 mEq total) by mouth daily. (Patient not taking: Reported on 04/04/2016) 60 tablet 0   No current facility-administered medications for this visit.    Facility-Administered Medications Ordered in Other Visits  Medication Dose Route Frequency Provider Last Rate Last Dose  . fludeoxyglucose F - 18 (FDG) injection 74.0 millicurie  81.4 millicurie Intravenous Once PRN [employer]., MD       Review of Systems  Constitutional: Positive for malaise/fatigue. Negative for chills, fever and weight loss.  HENT: Negative for congestion, hearing loss, nosebleeds, sore throat and tinnitus.   Eyes: Negative for blurred vision, double vision, pain and discharge.    Respiratory: Negative for cough, hemoptysis, sputum production, shortness of breath and wheezing.   Cardiovascular: Negative for chest pain, palpitations, claudication, leg swelling and PND.  Gastrointestinal: Negative for abdominal pain, blood in stool, constipation, diarrhea, heartburn, melena, nausea and vomiting.       Loss of apetite  Genitourinary: Negative for dysuria, frequency, hematuria and urgency.  Musculoskeletal: Positive for joint pain. Negative for falls and myalgias.       Knees felt weak when she was trying to walk  Skin: Negative for itching and rash.  Neurological: Negative for dizziness, tingling, tremors, sensory change, speech change, focal weakness, seizures, loss of consciousness, weakness and headaches.  Endo/Heme/Allergies: Does not bruise/bleed easily.  Psychiatric/Behavioral: Negative for depression, memory loss, substance abuse and suicidal ideas. The patient is not nervous/anxious and does not have insomnia.    14 point ROS was done and is otherwise as detailed above or in HPI  PHYSICAL EXAMINATION: ECOG PERFORMANCE STATUS: 0 - Asymptomatic   Vitals with BMI 04/04/2016  Height   Weight 218 lbs 13 oz  BMI   Systolic 481  Diastolic 61  Pulse 73  Respirations 16     Physical Exam  Constitutional: She is oriented to person, place, and time and well-developed, well-nourished, and in no distress.  HENT:  Head: Normocephalic and atraumatic.  Nose: Nose normal.  Mouth/Throat: Oropharynx is clear and moist. No oropharyngeal exudate.  Eyes: Conjunctivae and EOM are normal. Pupils are equal, round, and reactive to light. Right eye exhibits no discharge. Left eye exhibits no discharge. No scleral icterus.  Neck: Normal range of motion. Neck supple. No tracheal deviation present. No thyromegaly present.  Cardiovascular: Normal rate, regular rhythm and normal heart sounds.  Exam reveals no gallop and no friction rub.   No murmur heard. Pulmonary/Chest: Effort  normal and breath sounds normal. She has no wheezes. She has no rales.  Port site healing, erythematous but no evidence of infection  Abdominal: Soft. Bowel sounds are normal. She exhibits no distension and no mass. There is no tenderness. There is no rebound and  no guarding.  Musculoskeletal: Normal range of motion. She exhibits no edema.  Lymphadenopathy:    She has no cervical adenopathy.  Neurological: She is alert and oriented to person, place, and time. She has normal reflexes. No cranial nerve deficit. Gait normal. Coordination normal.  Skin: Skin is warm and dry. No rash noted.  Psychiatric: Mood, memory, affect and judgment normal.  Nursing note and vitals reviewed. Left breast examined with well healed breast and axillary incision sites. No erythema, no drainage. Good ROM of LUE. No lymphedema.  LABORATORY DATA:  I have reviewed the data as listed  Results for DARRIEN, LAAKSO (MRN [id number]) as of 04/04/2016 09:10  Ref. Range 03/15/2016 09:10  Sodium Latest Ref Range: 135 - 145 mmol/L 125 (L)  Potassium Latest Ref Range: 3.5 - 5.1 mmol/L 5.0  Chloride Latest Ref Range: 101 - 111 mmol/L 97 (L)  CO2 Latest Ref Range: 22 - 32 mmol/L 19 (L)  BUN Latest Ref Range: 6 - 20 mg/dL 8  Creatinine Latest Ref Range: 0.44 - 1.00 mg/dL 0.67  Calcium Latest Ref Range: 8.9 - 10.3 mg/dL 8.9  EGFR (Non-African Amer.) Latest Ref Range: >60 mL/min >60  EGFR (African American) Latest Ref Range: >60 mL/min >60  Glucose Latest Ref Range: 65 - 99 mg/dL 399 (H)  Anion gap Latest Ref Range: 5 - 15  9  Alkaline Phosphatase Latest Ref Range: 38 - 126 U/L 56  Albumin Latest Ref Range: 3.5 - 5.0 g/dL 2.9 (L)  AST Latest Ref Range: 15 - 41 U/L 26  ALT Latest Ref Range: 14 - 54 U/L 28  Total Protein Latest Ref Range: 6.5 - 8.1 g/dL 6.0 (L)  Total Bilirubin Latest Ref Range: 0.3 - 1.2 mg/dL 0.5  WBC Latest Ref Range: 4.0 - 10.5 K/uL 8.8  RBC Latest Ref Range: 3.87 - 5.11 MIL/uL 3.71 (L)  Hemoglobin Latest Ref  Range: 12.0 - 15.0 g/dL 11.1 (L)  HCT Latest Ref Range: 36.0 - 46.0 % 33.0 (L)  MCV Latest Ref Range: 78.0 - 100.0 fL 88.9  MCH Latest Ref Range: 26.0 - 34.0 pg 29.9  MCHC Latest Ref Range: 30.0 - 36.0 g/dL 33.6  RDW Latest Ref Range: 11.5 - 15.5 % 18.5 (H)  Platelets Latest Ref Range: 150 - 400 K/uL 213  Neutrophils Latest Units: % 87  Lymphocytes Latest Units: % 6  Monocytes Relative Latest Units: % 7  Eosinophil Latest Units: % 0  Basophil Latest Units: % 0  NEUT# Latest Ref Range: 1.7 - 7.7 K/uL 7.7  Lymphocyte # Latest Ref Range: 0.7 - 4.0 K/uL 0.5 (L)  Monocyte # Latest Ref Range: 0.1 - 1.0 K/uL 0.6  Eosinophils Absolute Latest Ref Range: 0.0 - 0.7 K/uL 0.0  Basophils Absolute Latest Ref Range: 0.0 - 0.1 K/uL 0.0    RADIOGRAPHIC STUDIES: I have personally reviewed the radiological images as listed and agreed with the findings in the report. No results found.   CLINICAL DATA:  Initial treatment strategy for pulmonary nodules.  EXAM: NUCLEAR MEDICINE PET SKULL BASE TO THIGH  TECHNIQUE: 10.7 mCi F-18 FDG was injected intravenously. Full-ring PET imaging was performed from the skull base to thigh after the radiotracer. CT data was obtained and used for attenuation correction and anatomic localization.  FASTING BLOOD GLUCOSE:  Value: 116 mg/dl  COMPARISON:  CT chest 02/01/2016.  FINDINGS: NECK  No hypermetabolic lymph nodes in the neck. CT images show no acute findings.  CHEST  An irregular nodule in the posterior segment  right upper lobe (series 8, image 29), is poorly measured on the current exam due to respiratory motion. It appears grossly similar from 02/01/2016 and has an SUV max of 1.5. Other pulmonary nodules are too small for PET resolution. No hypermetabolic mediastinal, hilar or axillary lymph nodes. A subcutaneous nodule along the medial right shoulder measures 1.4 x 2.1 cm (CT image 34) with an SUV max of 3.8. There is mild hypermetabolism  associated with the left breast and left axilla, likely postoperative in etiology. Aortic atherosclerosis and coronary artery calcification. No pericardial or pleural effusion.  ABDOMEN/PELVIS  No abnormal hypermetabolism in the liver, adrenal glands, spleen or pancreas. No hypermetabolic lymph nodes. Liver margin is irregular. Cholecystectomy. Nodular thickening of the right adrenal gland. Left adrenal glands, kidneys, spleen, pancreas, stomach and bowel are grossly unremarkable. Varices are seen in the abdomen. No free fluid.  SKELETON  No abnormal osseous hypermetabolism.  IMPRESSION: 1. Majority of visualized pulmonary nodules are too small for PET resolution. Largest nodule in the right upper lobe is likely stable and does not show metabolism above blood pool. Follow-up CT chest without contrast in 3 months is recommended in further evaluation, to document stability, as malignancy cannot be excluded. 2. Hypermetabolic subcutaneous nodule along the medial right shoulder. This the should be amenable to direct visualization/palpation. 3. Mild hypermetabolism associated with the left breast and left axilla, likely postoperative in etiology. Please correlate clinically. 4. Aortic atherosclerosis and coronary artery calcification. 5. Cirrhosis.   Electronically Signed   By: Lorin Picket M.D.   On: 04/03/2016 13:51  ASSESSMENT & PLAN:  Stage I ER- PR- HER 2 neu - carcinoma of the L breast Lumpectomy and sentinel LN biopsy with Dr. Ladona Horns on 11/30/2015 Tobacco Abuse Port a cath placement with Dr. Ladona Horns  Pulmonary nodules, RUL nodule Cirrhosis Hypermetabolic subcutaneous nodule on PET  We reviewed her PET in detail. I advised her I was going to send a note to Dr. Roxan Hockey for his opinion regarding a visit with CT surgery vs. Just ongoing imaging observation with [place].   She is working on smoking cessation and this was of course encouraged.    We discussed  therapy after radiation as she had multiple questions. I again reviewed the triple negative nature of her disease. Endocrine therapy is not indicated.   I encouraged her to walk daily during radiation to ease the fatigue.   I have refilled her zofran   Will follow up with patient towards the end of radiation.  We did not examine the subcutaneous nodule mentioned on the PET prior to her departure today, I will call her back next week for brief exam and further recommendations.   All questions were answered. The patient knows to call the clinic with any problems, questions or concerns.  This note was electronically signed.   This document serves as a record of services personally performed by Ancil Linsey, MD. It was created on her behalf by Elmyra Ricks, a trained medical scribe. The creation of this record is based on the scribe's personal observations and the provider's statements to them. This document has been checked and approved by the attending provider.  I have reviewed the above documentation for accuracy and completeness and I agree with the above. Molli Hazard, MD  04/07/2016 1:38 PM

## 2016-04-04 NOTE — Patient Instructions (Addendum)
[place] at [facility] Discharge Instructions  RECOMMENDATIONS MADE BY THE CONSULTANT AND ANY TEST RESULTS WILL BE SENT TO YOUR REFERRING PHYSICIAN.  You saw Dr. Whitney Muse today. Dr. Whitney Muse will get in touch with Dr. Roxan Hockey. Follow up at cancer center in 8 weeks with labs. Zofran refill and Nicorette gum prescription both went to your pharmacy, [facility].  Thank you for choosing [place] at [facility] to provide your oncology and hematology care.  To afford each patient quality time with our provider, please arrive at least 15 minutes before your scheduled appointment time.   Beginning January 23rd 2017 lab work for the [facility] will be done in the  Main lab at [facility] on 1st floor. If you have a lab appointment with the [facility] please come in thru the  Main Entrance and check in at the main information desk  You need to re-schedule your appointment should you arrive 10 or more minutes late.  We strive to give you quality time with our providers, and arriving late affects you and other patients whose appointments are after yours.  Also, if you no show three or more times for appointments you may be dismissed from the clinic at the providers discretion.     Again, thank you for choosing [facility].  Our hope is that these requests will decrease the amount of time that you wait before being seen by our physicians.       _____________________________________________________________  Should you have questions after your visit to [facility], please contact our office at [phone] between the hours of 8:30 a.m. and 4:30 p.m.  Voicemails left after 4:30 p.m. will not be returned until the following business day.  For prescription refill requests, have your pharmacy contact our office.         Resources For Cancer Patients and their Caregivers ? American Cancer Society: Can assist with  transportation, wigs, general needs, runs Look Good Feel Better.        340-171-8335 ? Cancer Care: Provides financial assistance, online support groups, medication/co-pay assistance.  1-800-813-HOPE (361)443-7136) ? [facility] Assists [place] cancer patients and their families through emotional , educational and financial support.  628-393-1439 ? [place] DSS Where to apply for food stamps, Medicaid and utility assistance. 337-558-8008 ? RCATS: Transportation to medical appointments. 618-393-3222 ? Social Security Administration: May apply for disability if have a Stage IV cancer. 959-484-7960 252-852-8769 ? [employer], Disability and Transit Services: Assists with nutrition, care and transit needs. [place] Support Programs: @10RELATIVEDAYS @ > Cancer Support Group  2nd Tuesday of the month 1pm-2pm, Journey Room  > Creative Journey  3rd Tuesday of the month 1130am-1pm, Journey Room  > Look Good Feel Better  1st Wednesday of the month 10am-12 noon, Journey Room (Call [place] to register [phone])

## 2016-04-07 ENCOUNTER — Encounter (HOSPITAL_COMMUNITY): Payer: Self-pay | Admitting: Hematology & Oncology

## 2016-04-09 DIAGNOSIS — C50412 Malignant neoplasm of upper-outer quadrant of left female breast: Secondary | ICD-10-CM | POA: Diagnosis not present

## 2016-04-09 DIAGNOSIS — E119 Type 2 diabetes mellitus without complications: Secondary | ICD-10-CM | POA: Diagnosis not present

## 2016-04-09 DIAGNOSIS — F17200 Nicotine dependence, unspecified, uncomplicated: Secondary | ICD-10-CM | POA: Diagnosis not present

## 2016-04-09 DIAGNOSIS — R600 Localized edema: Secondary | ICD-10-CM | POA: Diagnosis not present

## 2016-04-09 DIAGNOSIS — Z510 Encounter for antineoplastic radiation therapy: Secondary | ICD-10-CM | POA: Diagnosis not present

## 2016-04-10 DIAGNOSIS — E119 Type 2 diabetes mellitus without complications: Secondary | ICD-10-CM | POA: Diagnosis not present

## 2016-04-10 DIAGNOSIS — Z510 Encounter for antineoplastic radiation therapy: Secondary | ICD-10-CM | POA: Diagnosis not present

## 2016-04-10 DIAGNOSIS — C50412 Malignant neoplasm of upper-outer quadrant of left female breast: Secondary | ICD-10-CM | POA: Diagnosis not present

## 2016-04-10 DIAGNOSIS — R600 Localized edema: Secondary | ICD-10-CM | POA: Diagnosis not present

## 2016-04-10 DIAGNOSIS — F17200 Nicotine dependence, unspecified, uncomplicated: Secondary | ICD-10-CM | POA: Diagnosis not present

## 2016-04-12 DIAGNOSIS — E119 Type 2 diabetes mellitus without complications: Secondary | ICD-10-CM | POA: Diagnosis not present

## 2016-04-12 DIAGNOSIS — R600 Localized edema: Secondary | ICD-10-CM | POA: Diagnosis not present

## 2016-04-12 DIAGNOSIS — Z510 Encounter for antineoplastic radiation therapy: Secondary | ICD-10-CM | POA: Diagnosis not present

## 2016-04-12 DIAGNOSIS — F17200 Nicotine dependence, unspecified, uncomplicated: Secondary | ICD-10-CM | POA: Diagnosis not present

## 2016-04-12 DIAGNOSIS — C50412 Malignant neoplasm of upper-outer quadrant of left female breast: Secondary | ICD-10-CM | POA: Diagnosis not present

## 2016-04-15 DIAGNOSIS — E119 Type 2 diabetes mellitus without complications: Secondary | ICD-10-CM | POA: Diagnosis not present

## 2016-04-15 DIAGNOSIS — Z510 Encounter for antineoplastic radiation therapy: Secondary | ICD-10-CM | POA: Diagnosis not present

## 2016-04-15 DIAGNOSIS — F17200 Nicotine dependence, unspecified, uncomplicated: Secondary | ICD-10-CM | POA: Diagnosis not present

## 2016-04-15 DIAGNOSIS — R600 Localized edema: Secondary | ICD-10-CM | POA: Diagnosis not present

## 2016-04-15 DIAGNOSIS — C50412 Malignant neoplasm of upper-outer quadrant of left female breast: Secondary | ICD-10-CM | POA: Diagnosis not present

## 2016-04-16 DIAGNOSIS — F17200 Nicotine dependence, unspecified, uncomplicated: Secondary | ICD-10-CM | POA: Diagnosis not present

## 2016-04-16 DIAGNOSIS — E119 Type 2 diabetes mellitus without complications: Secondary | ICD-10-CM | POA: Diagnosis not present

## 2016-04-16 DIAGNOSIS — E1165 Type 2 diabetes mellitus with hyperglycemia: Secondary | ICD-10-CM | POA: Diagnosis not present

## 2016-04-16 DIAGNOSIS — I10 Essential (primary) hypertension: Secondary | ICD-10-CM | POA: Diagnosis not present

## 2016-04-16 DIAGNOSIS — R600 Localized edema: Secondary | ICD-10-CM | POA: Diagnosis not present

## 2016-04-16 DIAGNOSIS — Z510 Encounter for antineoplastic radiation therapy: Secondary | ICD-10-CM | POA: Diagnosis not present

## 2016-04-16 DIAGNOSIS — C50412 Malignant neoplasm of upper-outer quadrant of left female breast: Secondary | ICD-10-CM | POA: Diagnosis not present

## 2016-04-16 DIAGNOSIS — R601 Generalized edema: Secondary | ICD-10-CM | POA: Diagnosis not present

## 2016-04-17 DIAGNOSIS — F17200 Nicotine dependence, unspecified, uncomplicated: Secondary | ICD-10-CM | POA: Diagnosis not present

## 2016-04-17 DIAGNOSIS — Z510 Encounter for antineoplastic radiation therapy: Secondary | ICD-10-CM | POA: Diagnosis not present

## 2016-04-17 DIAGNOSIS — C50412 Malignant neoplasm of upper-outer quadrant of left female breast: Secondary | ICD-10-CM | POA: Diagnosis not present

## 2016-04-17 DIAGNOSIS — E119 Type 2 diabetes mellitus without complications: Secondary | ICD-10-CM | POA: Diagnosis not present

## 2016-04-17 DIAGNOSIS — R600 Localized edema: Secondary | ICD-10-CM | POA: Diagnosis not present

## 2016-04-18 DIAGNOSIS — E119 Type 2 diabetes mellitus without complications: Secondary | ICD-10-CM | POA: Diagnosis not present

## 2016-04-18 DIAGNOSIS — F17200 Nicotine dependence, unspecified, uncomplicated: Secondary | ICD-10-CM | POA: Diagnosis not present

## 2016-04-18 DIAGNOSIS — R600 Localized edema: Secondary | ICD-10-CM | POA: Diagnosis not present

## 2016-04-18 DIAGNOSIS — Z510 Encounter for antineoplastic radiation therapy: Secondary | ICD-10-CM | POA: Diagnosis not present

## 2016-04-18 DIAGNOSIS — C50412 Malignant neoplasm of upper-outer quadrant of left female breast: Secondary | ICD-10-CM | POA: Diagnosis not present

## 2016-04-19 DIAGNOSIS — C50412 Malignant neoplasm of upper-outer quadrant of left female breast: Secondary | ICD-10-CM | POA: Diagnosis not present

## 2016-04-19 DIAGNOSIS — E119 Type 2 diabetes mellitus without complications: Secondary | ICD-10-CM | POA: Diagnosis not present

## 2016-04-19 DIAGNOSIS — Z510 Encounter for antineoplastic radiation therapy: Secondary | ICD-10-CM | POA: Diagnosis not present

## 2016-04-19 DIAGNOSIS — F17200 Nicotine dependence, unspecified, uncomplicated: Secondary | ICD-10-CM | POA: Diagnosis not present

## 2016-04-19 DIAGNOSIS — R600 Localized edema: Secondary | ICD-10-CM | POA: Diagnosis not present

## 2016-04-22 DIAGNOSIS — C50412 Malignant neoplasm of upper-outer quadrant of left female breast: Secondary | ICD-10-CM | POA: Diagnosis not present

## 2016-04-22 DIAGNOSIS — R600 Localized edema: Secondary | ICD-10-CM | POA: Diagnosis not present

## 2016-04-22 DIAGNOSIS — Z510 Encounter for antineoplastic radiation therapy: Secondary | ICD-10-CM | POA: Diagnosis not present

## 2016-04-22 DIAGNOSIS — F17200 Nicotine dependence, unspecified, uncomplicated: Secondary | ICD-10-CM | POA: Diagnosis not present

## 2016-04-22 DIAGNOSIS — E119 Type 2 diabetes mellitus without complications: Secondary | ICD-10-CM | POA: Diagnosis not present

## 2016-04-23 DIAGNOSIS — Z510 Encounter for antineoplastic radiation therapy: Secondary | ICD-10-CM | POA: Diagnosis not present

## 2016-04-23 DIAGNOSIS — R601 Generalized edema: Secondary | ICD-10-CM | POA: Diagnosis not present

## 2016-04-23 DIAGNOSIS — F17200 Nicotine dependence, unspecified, uncomplicated: Secondary | ICD-10-CM | POA: Diagnosis not present

## 2016-04-23 DIAGNOSIS — I10 Essential (primary) hypertension: Secondary | ICD-10-CM | POA: Diagnosis not present

## 2016-04-23 DIAGNOSIS — R0602 Shortness of breath: Secondary | ICD-10-CM | POA: Diagnosis not present

## 2016-04-23 DIAGNOSIS — C50412 Malignant neoplasm of upper-outer quadrant of left female breast: Secondary | ICD-10-CM | POA: Diagnosis not present

## 2016-04-23 DIAGNOSIS — E119 Type 2 diabetes mellitus without complications: Secondary | ICD-10-CM | POA: Diagnosis not present

## 2016-04-23 DIAGNOSIS — Z79899 Other long term (current) drug therapy: Secondary | ICD-10-CM | POA: Diagnosis not present

## 2016-04-23 DIAGNOSIS — E1165 Type 2 diabetes mellitus with hyperglycemia: Secondary | ICD-10-CM | POA: Diagnosis not present

## 2016-04-23 DIAGNOSIS — R600 Localized edema: Secondary | ICD-10-CM | POA: Diagnosis not present

## 2016-04-24 DIAGNOSIS — Z510 Encounter for antineoplastic radiation therapy: Secondary | ICD-10-CM | POA: Diagnosis not present

## 2016-04-24 DIAGNOSIS — E119 Type 2 diabetes mellitus without complications: Secondary | ICD-10-CM | POA: Diagnosis not present

## 2016-04-24 DIAGNOSIS — F17200 Nicotine dependence, unspecified, uncomplicated: Secondary | ICD-10-CM | POA: Diagnosis not present

## 2016-04-24 DIAGNOSIS — C50412 Malignant neoplasm of upper-outer quadrant of left female breast: Secondary | ICD-10-CM | POA: Diagnosis not present

## 2016-04-24 DIAGNOSIS — R600 Localized edema: Secondary | ICD-10-CM | POA: Diagnosis not present

## 2016-04-25 ENCOUNTER — Encounter (HOSPITAL_BASED_OUTPATIENT_CLINIC_OR_DEPARTMENT_OTHER): Payer: Medicare Other | Admitting: Oncology

## 2016-04-25 ENCOUNTER — Ambulatory Visit (HOSPITAL_COMMUNITY)
Admission: RE | Admit: 2016-04-25 | Discharge: 2016-04-25 | Disposition: A | Discharge status: Home / Self Care | Payer: Medicare Other | Source: Ambulatory Visit | Attending: Oncology | Admitting: Oncology

## 2016-04-25 ENCOUNTER — Encounter (HOSPITAL_COMMUNITY): Payer: Medicare Other

## 2016-04-25 ENCOUNTER — Other Ambulatory Visit (HOSPITAL_COMMUNITY): Payer: Self-pay | Admitting: Emergency Medicine

## 2016-04-25 ENCOUNTER — Other Ambulatory Visit (HOSPITAL_COMMUNITY): Payer: Self-pay | Admitting: Oncology

## 2016-04-25 ENCOUNTER — Encounter (HOSPITAL_COMMUNITY): Payer: Self-pay | Admitting: Oncology

## 2016-04-25 VITALS — BP 122/51 | HR 66 | Temp 97.8°F | Resp 16

## 2016-04-25 DIAGNOSIS — I358 Other nonrheumatic aortic valve disorders: Secondary | ICD-10-CM | POA: Diagnosis not present

## 2016-04-25 DIAGNOSIS — I515 Myocardial degeneration: Secondary | ICD-10-CM | POA: Insufficient documentation

## 2016-04-25 DIAGNOSIS — F419 Anxiety disorder, unspecified: Secondary | ICD-10-CM | POA: Diagnosis not present

## 2016-04-25 DIAGNOSIS — I5189 Other ill-defined heart diseases: Secondary | ICD-10-CM | POA: Diagnosis not present

## 2016-04-25 DIAGNOSIS — I071 Rheumatic tricuspid insufficiency: Secondary | ICD-10-CM | POA: Diagnosis not present

## 2016-04-25 DIAGNOSIS — E876 Hypokalemia: Secondary | ICD-10-CM | POA: Diagnosis not present

## 2016-04-25 DIAGNOSIS — I340 Nonrheumatic mitral (valve) insufficiency: Secondary | ICD-10-CM | POA: Insufficient documentation

## 2016-04-25 DIAGNOSIS — C50412 Malignant neoplasm of upper-outer quadrant of left female breast: Secondary | ICD-10-CM | POA: Diagnosis not present

## 2016-04-25 DIAGNOSIS — R600 Localized edema: Secondary | ICD-10-CM | POA: Insufficient documentation

## 2016-04-25 DIAGNOSIS — Z23 Encounter for immunization: Secondary | ICD-10-CM | POA: Diagnosis not present

## 2016-04-25 DIAGNOSIS — Z9889 Other specified postprocedural states: Secondary | ICD-10-CM | POA: Diagnosis not present

## 2016-04-25 DIAGNOSIS — I517 Cardiomegaly: Secondary | ICD-10-CM | POA: Diagnosis not present

## 2016-04-25 DIAGNOSIS — Z0000 Encounter for general adult medical examination without abnormal findings: Secondary | ICD-10-CM

## 2016-04-25 DIAGNOSIS — F17200 Nicotine dependence, unspecified, uncomplicated: Secondary | ICD-10-CM | POA: Diagnosis not present

## 2016-04-25 DIAGNOSIS — F17210 Nicotine dependence, cigarettes, uncomplicated: Secondary | ICD-10-CM | POA: Diagnosis not present

## 2016-04-25 DIAGNOSIS — I10 Essential (primary) hypertension: Secondary | ICD-10-CM | POA: Diagnosis not present

## 2016-04-25 DIAGNOSIS — Z510 Encounter for antineoplastic radiation therapy: Secondary | ICD-10-CM | POA: Diagnosis not present

## 2016-04-25 DIAGNOSIS — C50912 Malignant neoplasm of unspecified site of left female breast: Secondary | ICD-10-CM | POA: Diagnosis not present

## 2016-04-25 DIAGNOSIS — Z79899 Other long term (current) drug therapy: Secondary | ICD-10-CM | POA: Diagnosis not present

## 2016-04-25 DIAGNOSIS — F329 Major depressive disorder, single episode, unspecified: Secondary | ICD-10-CM | POA: Diagnosis not present

## 2016-04-25 DIAGNOSIS — E119 Type 2 diabetes mellitus without complications: Secondary | ICD-10-CM | POA: Diagnosis not present

## 2016-04-25 DIAGNOSIS — Z7984 Long term (current) use of oral hypoglycemic drugs: Secondary | ICD-10-CM | POA: Diagnosis not present

## 2016-04-25 LAB — ECHOCARDIOGRAM COMPLETE
AV Peak grad: 14 mmHg
AV pk vel: 190 cm/s
AV vel: 2.7
AVAREAMEANV: 2.32 cm2
AVAREAMEANVIN: 1.05 cm2/m2
AVAREAVTI: 2.35 cm2
AVAREAVTIIND: 1.22 cm2/m2
AVCELMEANRAT: 0.67
AVG: 6 mmHg
Ao pk vel: 0.68 m/s
CHL CUP AV PEAK INDEX: 1.06
CHL CUP DOP CALC LVOT VTI: 33.3 cm
CHL CUP MV DEC (S): 254
CHL CUP STROKE VOLUME: 53 mL
DOP CAL AO MEAN VELOCITY: 116 cm/s
EERAT: 12.86
EWDT: 254 ms
FS: 39 % (ref 28–44)
IVS/LV PW RATIO, ED: 1.06
LA diam end sys: 42 mm
LA diam index: 1.89 cm/m2
LA vol A4C: 66 ml
LA vol: 69.8 mL
LASIZE: 42 mm
LAVOLIN: 31.5 mL/m2
LDCA: 3.46 cm2
LV E/e'average: 12.86
LV PW d: 11 mm — AB (ref 0.6–1.1)
LV dias vol: 82 mL (ref 46–106)
LV e' LATERAL: 9.25 cm/s
LV sys vol index: 13 mL/m2
LVDIAVOLIN: 37 mL/m2
LVEEMED: 12.86
LVOT peak grad rest: 7 mmHg
LVOT peak vel: 129 cm/s
LVOTD: 21 mm
LVOTSV: 115 mL
LVOTVTI: 0.78 cm
LVSYSVOL: 29 mL (ref 14–42)
Lateral S' vel: 15.2 cm/s
MV Peak grad: 6 mmHg
MV pk A vel: 103 m/s
MVPKEVEL: 119 m/s
RV TAPSE: 31.3 mm
Simpson's disk: 65
TDI e' lateral: 9.25
TDI e' medial: 9.14
VTI: 42.6 cm
Valve area index: 1.22
Valve area: 2.7 cm2

## 2016-04-25 LAB — BRAIN NATRIURETIC PEPTIDE: B NATRIURETIC PEPTIDE 5: 153 pg/mL — AB (ref 0.0–100.0)

## 2016-04-25 MED ORDER — PNEUMOCOCCAL 13-VAL CONJ VACC IM SUSP
0.5000 mL | Freq: Once | INTRAMUSCULAR | Status: AC
  Administered 2016-04-25: 0.5 mL via INTRAMUSCULAR
  Filled 2016-04-25: qty 0.5

## 2016-04-25 MED ORDER — INFLUENZA VAC SPLIT QUAD 0.5 ML IM SUSY
0.5000 mL | PREFILLED_SYRINGE | Freq: Once | INTRAMUSCULAR | Status: AC
  Administered 2016-04-25: 0.5 mL via INTRAMUSCULAR
  Filled 2016-04-25: qty 0.5

## 2016-04-25 MED ORDER — POTASSIUM CHLORIDE CRYS ER 20 MEQ PO TBCR: 40.0000 meq | EXTENDED_RELEASE_TABLET | Freq: Every day | ORAL | 0 refills | Status: DC

## 2016-04-25 MED ORDER — FUROSEMIDE 20 MG PO TABS: 20.0000 mg | ORAL_TABLET | Freq: Every day | ORAL | 1 refills | Status: DC

## 2016-04-25 NOTE — Patient Instructions (Signed)
[place] at [facility] Discharge Instructions  RECOMMENDATIONS MADE BY THE CONSULTANT AND ANY TEST RESULTS WILL BE SENT TO YOUR REFERRING PHYSICIAN.  You were seen by Gershon Mussel today Flu shot today Echo today at 2:30 Return as scheduled Prescription given for Lasix and Kdur  Thank you for choosing [place] at [facility] to provide your oncology and hematology care.  To afford each patient quality time with our provider, please arrive at least 15 minutes before your scheduled appointment time.   Beginning January 23rd 2017 lab work for the [facility] will be done in the  Main lab at [facility] on 1st floor. If you have a lab appointment with the [facility] please come in thru the  Main Entrance and check in at the main information desk  You need to re-schedule your appointment should you arrive 10 or more minutes late.  We strive to give you quality time with our providers, and arriving late affects you and other patients whose appointments are after yours.  Also, if you no show three or more times for appointments you may be dismissed from the clinic at the providers discretion.     Again, thank you for choosing [facility].  Our hope is that these requests will decrease the amount of time that you wait before being seen by our physicians.       _____________________________________________________________  Should you have questions after your visit to [facility], please contact our office at [phone] between the hours of 8:30 a.m. and 4:30 p.m.  Voicemails left after 4:30 p.m. will not be returned until the following business day.  For prescription refill requests, have your pharmacy contact our office.         Resources For Cancer Patients and their Caregivers ? American Cancer Society: Can assist with transportation, wigs, general needs, runs Look Good Feel Better.        727-119-2222 ? Cancer  Care: Provides financial assistance, online support groups, medication/co-pay assistance.  1-800-813-HOPE 321-567-0691) ? [place] Assists [place] cancer patients and their families through emotional , educational and financial support.  210 269 7124 ? [place] DSS Where to apply for food stamps, Medicaid and utility assistance. 314-226-9796 ? RCATS: Transportation to medical appointments. 825-492-7151 ? Social Security Administration: May apply for disability if have a Stage IV cancer. 413-832-7925 437-039-2469 ? [employer], Disability and Transit Services: Assists with nutrition, care and transit needs. [place] Support Programs: @10RELATIVEDAYS @ > Cancer Support Group  2nd Tuesday of the month 1pm-2pm, Journey Room  > Creative Journey  3rd Tuesday of the month 1130am-1pm, Journey Room  > Look Good Feel Better  1st Wednesday of the month 10am-12 noon, Journey Room (Call [place] to register [phone])

## 2016-04-25 NOTE — Progress Notes (Signed)
*  PRELIMINARY RESULTS* Echocardiogram 2D Echocardiogram has been performed.  Taylor Williams 04/25/2016, 3:25 PM

## 2016-04-25 NOTE — Progress Notes (Signed)
Patient is seen as a work-in today at the request of radiation oncology.  It is noted that she has gained ~ 20 lbs with significant LE edema.  She notes that this has been progressive over the past few weeks.  She is keeping her legs elevated and the patient notes some improvement.  She otherwise feels well.  She denies any fevers or chills.  She denies any cough.  She denies any orthopnea.  She is sleeping well.  She denies any chest pain or shortness of breath/dyspnea.  She saw her primary care provider regarding this issue and was advised to increase her protein intake.  BP (!) 122/51 (BP Location: Right Arm)   Pulse 66   Temp 97.8 F (36.6 C) (Oral)   Resp 16   SpO2 98%  Gen: Alert, NAD, smiling, accompanied by friend. Skin: Warm and dry HEENT: Atraumatic Cardiac: RRR, no murmur/rub/gallop Lungs: CTA B/L without wheezes, rales, rhonchi Extremities: B/L LE edema, 2-3+ pitting, greater in lower 1/3 of leg and ankle. BACK/SHOULDER: unimpressive exam.  Small sebaceous cyst noted on right upper back without erythema or tenderness.  No rash or lesion.  No palpable mass. Neuro: A and O x 3.  Assessment: 1. B/L LE edema  Plan: 1. BNP- result is unimpressive at 153. 2. 2D Echo- later today at 2:30 PM 3. Influenza vaccine today 4. Prevnar 13 immunization today- patient's request.   5. Rx for Lasix 20 mg PO daily. 6. Continue HCTZ-containing antihypertensive 7. Rx for K+ 8. Return as scheduled for follow-up.  Patient and plan discussed with Dr. Ancil Linsey and she is in agreement with the aforementioned.   Robynn Pane, PA-C 04/25/2016 12:41 PM

## 2016-04-25 NOTE — Progress Notes (Signed)
Pt given flu an pneumonia vaccine without any complications. Pt verbalized understanding of both vaccination handouts.

## 2016-04-26 DIAGNOSIS — R600 Localized edema: Secondary | ICD-10-CM | POA: Diagnosis not present

## 2016-04-26 DIAGNOSIS — Z510 Encounter for antineoplastic radiation therapy: Secondary | ICD-10-CM | POA: Diagnosis not present

## 2016-04-26 DIAGNOSIS — F17200 Nicotine dependence, unspecified, uncomplicated: Secondary | ICD-10-CM | POA: Diagnosis not present

## 2016-04-26 DIAGNOSIS — E119 Type 2 diabetes mellitus without complications: Secondary | ICD-10-CM | POA: Diagnosis not present

## 2016-04-26 DIAGNOSIS — C50412 Malignant neoplasm of upper-outer quadrant of left female breast: Secondary | ICD-10-CM | POA: Diagnosis not present

## 2016-04-29 DIAGNOSIS — Z171 Estrogen receptor negative status [ER-]: Secondary | ICD-10-CM | POA: Diagnosis not present

## 2016-04-29 DIAGNOSIS — C50412 Malignant neoplasm of upper-outer quadrant of left female breast: Secondary | ICD-10-CM | POA: Diagnosis not present

## 2016-04-29 DIAGNOSIS — Z510 Encounter for antineoplastic radiation therapy: Secondary | ICD-10-CM | POA: Diagnosis not present

## 2016-04-30 DIAGNOSIS — C50412 Malignant neoplasm of upper-outer quadrant of left female breast: Secondary | ICD-10-CM | POA: Diagnosis not present

## 2016-04-30 DIAGNOSIS — Z171 Estrogen receptor negative status [ER-]: Secondary | ICD-10-CM | POA: Diagnosis not present

## 2016-04-30 DIAGNOSIS — Z510 Encounter for antineoplastic radiation therapy: Secondary | ICD-10-CM | POA: Diagnosis not present

## 2016-05-01 DIAGNOSIS — C50412 Malignant neoplasm of upper-outer quadrant of left female breast: Secondary | ICD-10-CM | POA: Diagnosis not present

## 2016-05-01 DIAGNOSIS — Z510 Encounter for antineoplastic radiation therapy: Secondary | ICD-10-CM | POA: Diagnosis not present

## 2016-05-01 DIAGNOSIS — Z171 Estrogen receptor negative status [ER-]: Secondary | ICD-10-CM | POA: Diagnosis not present

## 2016-05-02 DIAGNOSIS — Z171 Estrogen receptor negative status [ER-]: Secondary | ICD-10-CM | POA: Diagnosis not present

## 2016-05-02 DIAGNOSIS — Z510 Encounter for antineoplastic radiation therapy: Secondary | ICD-10-CM | POA: Diagnosis not present

## 2016-05-02 DIAGNOSIS — C50412 Malignant neoplasm of upper-outer quadrant of left female breast: Secondary | ICD-10-CM | POA: Diagnosis not present

## 2016-05-03 DIAGNOSIS — Z510 Encounter for antineoplastic radiation therapy: Secondary | ICD-10-CM | POA: Diagnosis not present

## 2016-05-03 DIAGNOSIS — C50412 Malignant neoplasm of upper-outer quadrant of left female breast: Secondary | ICD-10-CM | POA: Diagnosis not present

## 2016-05-03 DIAGNOSIS — Z171 Estrogen receptor negative status [ER-]: Secondary | ICD-10-CM | POA: Diagnosis not present

## 2016-05-06 DIAGNOSIS — C50412 Malignant neoplasm of upper-outer quadrant of left female breast: Secondary | ICD-10-CM | POA: Diagnosis not present

## 2016-05-06 DIAGNOSIS — Z171 Estrogen receptor negative status [ER-]: Secondary | ICD-10-CM | POA: Diagnosis not present

## 2016-05-06 DIAGNOSIS — Z510 Encounter for antineoplastic radiation therapy: Secondary | ICD-10-CM | POA: Diagnosis not present

## 2016-05-07 ENCOUNTER — Telehealth (HOSPITAL_COMMUNITY): Payer: Self-pay | Admitting: *Deleted

## 2016-05-07 DIAGNOSIS — Z510 Encounter for antineoplastic radiation therapy: Secondary | ICD-10-CM | POA: Diagnosis not present

## 2016-05-07 DIAGNOSIS — Z171 Estrogen receptor negative status [ER-]: Secondary | ICD-10-CM | POA: Diagnosis not present

## 2016-05-07 DIAGNOSIS — C50412 Malignant neoplasm of upper-outer quadrant of left female breast: Secondary | ICD-10-CM | POA: Diagnosis not present

## 2016-05-07 NOTE — Telephone Encounter (Signed)
Pt states that the swelling is better but it is still there. Pt states that she has lost a total of 15 pounds since starting the Lasix. Pt states that she is using compression hose, and elevating her legs as well. Per Dr. Lianne Cure in [place] stated that the pt still has 3+ edema. Pt taking 42m Lasix daily.

## 2016-05-08 DIAGNOSIS — Z171 Estrogen receptor negative status [ER-]: Secondary | ICD-10-CM | POA: Diagnosis not present

## 2016-05-08 DIAGNOSIS — Z510 Encounter for antineoplastic radiation therapy: Secondary | ICD-10-CM | POA: Diagnosis not present

## 2016-05-08 DIAGNOSIS — C50412 Malignant neoplasm of upper-outer quadrant of left female breast: Secondary | ICD-10-CM | POA: Diagnosis not present

## 2016-05-08 NOTE — Telephone Encounter (Signed)
As discussed, internal medicine work-up for leg swelling is complete and still in process.  She has a cardiology appt upcoming.  Continue Lasix as prescribed.  Robynn Pane, PA-C 05/08/2016 5:06 PM

## 2016-05-09 DIAGNOSIS — Z510 Encounter for antineoplastic radiation therapy: Secondary | ICD-10-CM | POA: Diagnosis not present

## 2016-05-09 DIAGNOSIS — Z171 Estrogen receptor negative status [ER-]: Secondary | ICD-10-CM | POA: Diagnosis not present

## 2016-05-09 DIAGNOSIS — C50412 Malignant neoplasm of upper-outer quadrant of left female breast: Secondary | ICD-10-CM | POA: Diagnosis not present

## 2016-05-10 DIAGNOSIS — C50412 Malignant neoplasm of upper-outer quadrant of left female breast: Secondary | ICD-10-CM | POA: Diagnosis not present

## 2016-05-10 DIAGNOSIS — Z510 Encounter for antineoplastic radiation therapy: Secondary | ICD-10-CM | POA: Diagnosis not present

## 2016-05-10 DIAGNOSIS — Z171 Estrogen receptor negative status [ER-]: Secondary | ICD-10-CM | POA: Diagnosis not present

## 2016-05-13 DIAGNOSIS — Z171 Estrogen receptor negative status [ER-]: Secondary | ICD-10-CM | POA: Diagnosis not present

## 2016-05-13 DIAGNOSIS — C50412 Malignant neoplasm of upper-outer quadrant of left female breast: Secondary | ICD-10-CM | POA: Diagnosis not present

## 2016-05-13 DIAGNOSIS — Z510 Encounter for antineoplastic radiation therapy: Secondary | ICD-10-CM | POA: Diagnosis not present

## 2016-05-14 DIAGNOSIS — Z510 Encounter for antineoplastic radiation therapy: Secondary | ICD-10-CM | POA: Diagnosis not present

## 2016-05-14 DIAGNOSIS — C50412 Malignant neoplasm of upper-outer quadrant of left female breast: Secondary | ICD-10-CM | POA: Diagnosis not present

## 2016-05-14 DIAGNOSIS — Z171 Estrogen receptor negative status [ER-]: Secondary | ICD-10-CM | POA: Diagnosis not present

## 2016-05-15 DIAGNOSIS — Z171 Estrogen receptor negative status [ER-]: Secondary | ICD-10-CM | POA: Diagnosis not present

## 2016-05-15 DIAGNOSIS — C50412 Malignant neoplasm of upper-outer quadrant of left female breast: Secondary | ICD-10-CM | POA: Diagnosis not present

## 2016-05-15 DIAGNOSIS — Z510 Encounter for antineoplastic radiation therapy: Secondary | ICD-10-CM | POA: Diagnosis not present

## 2016-05-16 DIAGNOSIS — C50412 Malignant neoplasm of upper-outer quadrant of left female breast: Secondary | ICD-10-CM | POA: Diagnosis not present

## 2016-05-16 DIAGNOSIS — Z171 Estrogen receptor negative status [ER-]: Secondary | ICD-10-CM | POA: Diagnosis not present

## 2016-05-16 DIAGNOSIS — Z510 Encounter for antineoplastic radiation therapy: Secondary | ICD-10-CM | POA: Diagnosis not present

## 2016-05-17 NOTE — Progress Notes (Signed)
Cardiology Office Note   Date:  05/20/2016   ID:  Taylor Williams, DOB 04/21/1951, MRN [id number]  PCP:  [name], MD  Cardiologist:   Jenkins Rouge, MD   No chief complaint on file.     History of Present Illness: Taylor Williams is a 65 y.o. female who presents for evaluation of edema. Seen by primary 04/25/16 after being seen by Dr Whitney Muse. She is being Rx for left  breast cancer  [facility] and lumpectomy  20lb weight gain over last month. No dyspnea, chest pain orthopnea or PND.  BNP only 153 Started on Lasix 20 mg daily Echo reviewed 04/25/16 unimpressive except some diastolic dysfunction   Impressions:  - Mild LVH with LVEF 60-65%. Grade 2 diastolic dysfunction with   intermediate LV filling pressure. Mild left atrial enlargement.   MAC with trivial mitral regurgitation. Trivial tricuspid   regurgitation.  Active smoker  And Diabetic   NM PET CT portion sowed aortic atherosclerosis and coronary artery calcification   Labs reviewed and albumin low 2.9  Cr .67 BUN 8  Hct 33   BP has been low since Lasix added to lisinopril HCTZ 20/12.5  Edema started with chemo has varicose veins   Past Medical History:  Diagnosis Date  . Anxiety   . Depression   . High cholesterol   . Hypertension     Past Surgical History:  Procedure Laterality Date  . BREAST LUMPECTOMY Left    states for ductal carcinoma, had surgery in late may     Current Outpatient Prescriptions  Medication Sig Dispense Refill  . ALPRAZolam (XANAX) 0.25 MG tablet Take 0.25 mg by mouth 2 (two) times daily.    . cetirizine (ZYRTEC) 10 MG tablet Take 10 mg by mouth daily.    . citalopram (CELEXA) 20 MG tablet Take 20 mg by mouth daily.    . Diphenhyd-Hydrocort-Nystatin (FIRST-DUKES MOUTHWASH) SUSP Use as directed 5 mLs in the mouth or throat 4 (four) times daily as needed. 420 mL 2  . diphenoxylate-atropine (LOMOTIL) 2.5-0.025 MG tablet May take 1-2 tabs four times a day prn loose stools. 60 tablet 1  .  furosemide (LASIX) 20 MG tablet Take 1 tablet (20 mg total) by mouth daily. 15 tablet 1  . glipiZIDE (GLUCOTROL) 10 MG tablet Take 10 mg by mouth 2 (two) times daily before a meal.    . Krill Oil 500 MG CAPS Take 500 mg by mouth. 1,500 mg BID    . loperamide (IMODIUM A-D) 2 MG tablet Take 4 mg by mouth 2 (two) times daily.    Marland Kitchen LORazepam (ATIVAN) 0.5 MG tablet Take 1 tablet (0.5 mg total) by mouth every 4 (four) hours as needed for anxiety. Or as needed for nausea 60 tablet 0  . metFORMIN (GLUCOPHAGE) 1000 MG tablet Take 1,000 mg by mouth 2 (two) times daily with a meal.    . ondansetron (ZOFRAN) 8 MG tablet Take 1 tablet (8 mg total) by mouth every 8 (eight) hours as needed for nausea or vomiting. 30 tablet 2  . potassium chloride SA (K-DUR,KLOR-CON) 20 MEQ tablet Take 2 tablets (40 mEq total) by mouth daily. 60 tablet 0  . prochlorperazine (COMPAZINE) 10 MG tablet Take 1 tablet (10 mg total) by mouth every 6 (six) hours as needed for nausea or vomiting. 30 tablet 2  . promethazine (PHENERGAN) 25 MG tablet Take 1 tablet (25 mg total) by mouth every 6 (six) hours as needed for nausea or vomiting. 45  tablet 0  . sitaGLIPtin (JANUVIA) 100 MG tablet Take 100 mg by mouth daily.    Marland Kitchen lisinopril (PRINIVIL,ZESTRIL) 5 MG tablet Take 1 tablet (5 mg total) by mouth daily. 90 tablet 3   No current facility-administered medications for this visit.     Allergies:   Aspirin; Other; and Fish allergy    Social History:  The patient  reports that she has been smoking.  She has a 40.00 pack-year smoking history. She has never used smokeless tobacco. She reports that she does not drink alcohol or use drugs.   Family History:  The patient's family history is not on file.    ROS:  Please see the history of present illness.   Otherwise, review of systems are positive for none.   All other systems are reviewed and negative.    PHYSICAL EXAM: VS:  BP (!) 64/40   Pulse 78   Ht 5' 8"  (1.727 m)   Wt 97.4 kg (214  lb 12.8 oz)   BMI 32.66 kg/m  , BMI Body mass index is 32.66 kg/m. Affect appropriate Chronically ill white female with alopecia HEENT: normal  Scar from right port a cath removal  Neck supple with no adenopathy JVP normal no bruits no thyromegaly Lungs clear with no wheezing and good diaphragmatic motion Heart:  S1/S2 no murmur, no rub, gallop or click PMI normal Abdomen: benighn, BS positve, no tenderness, no AAA no bruit.  No HSM or HJR Distal pulses intact with no bruits Plus one bilateral edema with varicose veins  Neuro non-focal Skin warm and dry No muscular weakness    EKG:  05/20/16 NSR rate 68 normal ECG    Recent Labs: 03/15/2016: ALT 28; BUN 8; Creatinine, Ser 0.67; Hemoglobin 11.1; Platelets 213; Potassium 5.0; Sodium 125 04/25/2016: B Natriuretic Peptide 153.0    Lipid Panel No results found for: CHOL, TRIG, HDL, CHOLHDL, VLDL, LDLCALC, LDLDIRECT    Wt Readings from Last 3 Encounters:  05/20/16 97.4 kg (214 lb 12.8 oz)  04/04/16 99.2 kg (218 lb 12.8 oz)  03/15/16 98.8 kg (217 lb 12.8 oz)      Other studies Reviewed: Additional studies/ records that were reviewed today include: notes from oncology [facility] .    ASSESSMENT AND PLAN:  1.  Edema:  Does not appear cardiac related EF normal doubt related to "diastolic " abnormalities.  Continue lasix For now. Check 24 hr urine protein continue lasix 2. Hypotension: with dizziness stop lisinopril HCTZ 20/12.5 and only put on lisinopril 5 mg for renal protection  Check HCT, TSH, cortisol and BMET today  3. DM:  Discussed low carb diet.  Target hemoglobin A1c is 6.5 or less.  Continue current medications. 4. Breast Cancer finished with XRT and chemo f/u oncology     Current medicines are reviewed at length with the patient today.  The patient does not have concerns regarding medicines.  The following changes have been made:  Stop lisinopril/HCTZ start Lisinopril 5  mg   Labs/ tests ordered today include: 24 hr urine protein TSH, BMET Hct  Orders Placed This Encounter  Procedures  . Basic Metabolic Panel (BMET)  . Creatinine  . TSH  . Cortisol  . 5 HIAA, quantitative, Urine, 24 hour  . EKG 12-Lead     Disposition:   FU with [place] in 6-8 weeks      Signed, Jenkins Rouge, MD  05/20/2016 11:53 AM    [id number] [place], [place]  [address] Phone: [phone]; Fax: [phone]

## 2016-05-20 ENCOUNTER — Other Ambulatory Visit (HOSPITAL_COMMUNITY)
Admission: RE | Admit: 2016-05-20 | Discharge: 2016-05-20 | Disposition: A | Discharge status: Home / Self Care | Payer: Medicare Other | Source: Ambulatory Visit | Attending: Cardiovascular Disease | Admitting: Cardiovascular Disease

## 2016-05-20 ENCOUNTER — Ambulatory Visit (INDEPENDENT_AMBULATORY_CARE_PROVIDER_SITE_OTHER): Payer: Medicare Other | Admitting: Cardiovascular Disease

## 2016-05-20 VITALS — BP 64/40 | HR 78 | Ht 68.0 in | Wt 214.8 lb

## 2016-05-20 DIAGNOSIS — R600 Localized edema: Secondary | ICD-10-CM

## 2016-05-20 LAB — BASIC METABOLIC PANEL
ANION GAP: 10 (ref 5–15)
BUN: 10 mg/dL (ref 6–20)
CHLORIDE: 99 mmol/L — AB (ref 101–111)
CO2: 24 mmol/L (ref 22–32)
Calcium: 9.7 mg/dL (ref 8.9–10.3)
Creatinine, Ser: 0.99 mg/dL (ref 0.44–1.00)
GFR calc non Af Amer: 59 mL/min — ABNORMAL LOW (ref >60)
Glucose, Bld: 285 mg/dL — ABNORMAL HIGH (ref 65–99)
POTASSIUM: 3.8 mmol/L (ref 3.5–5.1)
Sodium: 133 mmol/L — ABNORMAL LOW (ref 135–145)

## 2016-05-20 LAB — TSH: TSH: 1.014 u[IU]/mL (ref 0.350–4.500)

## 2016-05-20 LAB — CORTISOL: Cortisol, Plasma: 9.1 ug/dL

## 2016-05-20 MED ORDER — LISINOPRIL 5 MG PO TABS
5.0000 mg | ORAL_TABLET | Freq: Every day | ORAL | 3 refills | Status: DC
Start: 2016-05-20 — End: 2016-06-03

## 2016-05-20 NOTE — Patient Instructions (Signed)
Medication Instructions:  Stop lisinopril/hctz Start lisinopril 5 mg   Labwork: Your physician recommends that you return for lab work in: today    Testing/Procedures: none  Follow-Up: Your physician recommends that you schedule a follow-up appointment in: 6-8 weeks    Any Other Special Instructions Will Be Listed Below (If Applicable).     If you need a refill on your cardiac medications before your next appointment, please call your pharmacy.

## 2016-05-22 ENCOUNTER — Other Ambulatory Visit (HOSPITAL_COMMUNITY)
Admission: RE | Admit: 2016-05-22 | Discharge: 2016-05-22 | Disposition: A | Discharge status: Home / Self Care | Payer: Medicare Other | Source: Ambulatory Visit | Attending: Cardiovascular Disease | Admitting: Cardiovascular Disease

## 2016-05-22 DIAGNOSIS — R600 Localized edema: Secondary | ICD-10-CM | POA: Insufficient documentation

## 2016-05-28 ENCOUNTER — Ambulatory Visit: Payer: Medicare Other | Admitting: Cardiology

## 2016-05-28 LAB — 5 HIAA, QUANTITATIVE, URINE, 24 HOUR
5 HIAA UR: 1.8 mg/L
5-HIAA,Quant.,24 Hr Urine: 3.3 mg/24 hr (ref 0.0–14.9)
Total Volume: 1850

## 2016-05-31 ENCOUNTER — Encounter (HOSPITAL_COMMUNITY): Payer: Self-pay | Admitting: Hematology & Oncology

## 2016-05-31 ENCOUNTER — Encounter (HOSPITAL_COMMUNITY): Payer: Medicare Other | Attending: Hematology & Oncology | Admitting: Hematology & Oncology

## 2016-05-31 ENCOUNTER — Encounter (HOSPITAL_COMMUNITY): Payer: Medicare Other

## 2016-05-31 VITALS — BP 126/62 | HR 63 | Temp 98.2°F | Resp 16 | Wt 226.3 lb

## 2016-05-31 DIAGNOSIS — F17210 Nicotine dependence, cigarettes, uncomplicated: Secondary | ICD-10-CM | POA: Insufficient documentation

## 2016-05-31 DIAGNOSIS — R911 Solitary pulmonary nodule: Secondary | ICD-10-CM | POA: Diagnosis not present

## 2016-05-31 DIAGNOSIS — C50919 Malignant neoplasm of unspecified site of unspecified female breast: Secondary | ICD-10-CM

## 2016-05-31 DIAGNOSIS — Z79899 Other long term (current) drug therapy: Secondary | ICD-10-CM | POA: Insufficient documentation

## 2016-05-31 DIAGNOSIS — Z7984 Long term (current) use of oral hypoglycemic drugs: Secondary | ICD-10-CM | POA: Diagnosis not present

## 2016-05-31 DIAGNOSIS — C50412 Malignant neoplasm of upper-outer quadrant of left female breast: Secondary | ICD-10-CM

## 2016-05-31 DIAGNOSIS — F419 Anxiety disorder, unspecified: Secondary | ICD-10-CM | POA: Diagnosis not present

## 2016-05-31 DIAGNOSIS — F329 Major depressive disorder, single episode, unspecified: Secondary | ICD-10-CM | POA: Diagnosis not present

## 2016-05-31 DIAGNOSIS — Z171 Estrogen receptor negative status [ER-]: Secondary | ICD-10-CM

## 2016-05-31 DIAGNOSIS — R600 Localized edema: Secondary | ICD-10-CM

## 2016-05-31 DIAGNOSIS — R609 Edema, unspecified: Secondary | ICD-10-CM | POA: Diagnosis not present

## 2016-05-31 DIAGNOSIS — Z9889 Other specified postprocedural states: Secondary | ICD-10-CM | POA: Diagnosis not present

## 2016-05-31 DIAGNOSIS — C50912 Malignant neoplasm of unspecified site of left female breast: Secondary | ICD-10-CM | POA: Insufficient documentation

## 2016-05-31 DIAGNOSIS — I10 Essential (primary) hypertension: Secondary | ICD-10-CM | POA: Insufficient documentation

## 2016-05-31 DIAGNOSIS — E876 Hypokalemia: Secondary | ICD-10-CM

## 2016-05-31 DIAGNOSIS — R918 Other nonspecific abnormal finding of lung field: Secondary | ICD-10-CM

## 2016-05-31 LAB — CBC WITH DIFFERENTIAL/PLATELET
BASOS ABS: 0 10*3/uL (ref 0.0–0.1)
Basophils Relative: 1 %
EOS PCT: 4 %
Eosinophils Absolute: 0.2 10*3/uL (ref 0.0–0.7)
HEMATOCRIT: 36 % (ref 36.0–46.0)
HEMOGLOBIN: 12.2 g/dL (ref 12.0–15.0)
LYMPHS PCT: 15 %
Lymphs Abs: 0.8 10*3/uL (ref 0.7–4.0)
MCH: 31.1 pg (ref 26.0–34.0)
MCHC: 33.9 g/dL (ref 30.0–36.0)
MCV: 91.8 fL (ref 78.0–100.0)
Monocytes Absolute: 0.4 10*3/uL (ref 0.1–1.0)
Monocytes Relative: 8 %
NEUTROS ABS: 3.9 10*3/uL (ref 1.7–7.7)
NEUTROS PCT: 72 %
PLATELETS: 158 10*3/uL (ref 150–400)
RBC: 3.92 MIL/uL (ref 3.87–5.11)
RDW: 13.2 % (ref 11.5–15.5)
WBC: 5.3 10*3/uL (ref 4.0–10.5)

## 2016-05-31 LAB — COMPREHENSIVE METABOLIC PANEL
ALT: 19 U/L (ref 14–54)
AST: 34 U/L (ref 15–41)
Albumin: 3.2 g/dL — ABNORMAL LOW (ref 3.5–5.0)
Alkaline Phosphatase: 54 U/L (ref 38–126)
Anion gap: 6 (ref 5–15)
BUN: 5 mg/dL — AB (ref 6–20)
CHLORIDE: 102 mmol/L (ref 101–111)
CO2: 27 mmol/L (ref 22–32)
CREATININE: 0.62 mg/dL (ref 0.44–1.00)
Calcium: 9.2 mg/dL (ref 8.9–10.3)
GFR calc Af Amer: >60 mL/min (ref >60)
Glucose, Bld: 168 mg/dL — ABNORMAL HIGH (ref 65–99)
Potassium: 3.6 mmol/L (ref 3.5–5.1)
Sodium: 135 mmol/L (ref 135–145)
Total Bilirubin: 0.6 mg/dL (ref 0.3–1.2)
Total Protein: 7.1 g/dL (ref 6.5–8.1)

## 2016-05-31 MED ORDER — POTASSIUM CHLORIDE CRYS ER 20 MEQ PO TBCR
20.0000 meq | EXTENDED_RELEASE_TABLET | Freq: Every day | ORAL | 3 refills | Status: AC
Start: 2016-05-31 — End: ?

## 2016-05-31 MED ORDER — FUROSEMIDE 20 MG PO TABS: 20.0000 mg | ORAL_TABLET | Freq: Every day | ORAL | 3 refills | Status: DC

## 2016-05-31 NOTE — Progress Notes (Signed)
[name]  Progress Note  Patient Care Team: [name], MD as PCP - General (Internal Medicine)  CHIEF COMPLAINTS:    Carcinoma of upper-outer quadrant of left female breast ([facility])   11/30/2015 Surgery    Lumpectomy and sentinel node procedure with Dr. Ladona Horns      11/30/2015 Surgery    Needle localization of the L breast with mammo guidance      11/30/2015 Pathology Results    L breast invasive ductal carcinoma, mod diff, 0.8 cm, focal DCIS, high grade, 2/2 LN negative, no LVI, pT1b, pN0M0, ER- PR- HER2 - Ki-67 92%      01/12/2016 - 03/15/2016 Chemotherapy    TC x 4 cycles      02/01/2016 Imaging    CT chest screening- Lung-RADS Category 4B, suspicious. Additional imaging evaluation or consultation with pulmonary medicine or thoracic surgery recommended. Bilateral pulmonary nodules, including an inferior right upper lobe spiculated 11 mm nodule.       03/13/2016 Procedure    Port removed by Dr. Ladona Horns due to concerns for infection.      03/14/2016 Procedure    PICC line placed      04/03/2016 PET scan    Majority of visualized pulmonary nodules are too small for PET resolution. Largest nodule in the right upper lobe is likely stable and does not show metabolism above blood pool. Follow-up CT chest without contrast in 3 months is recommended in further evaluation, to document stability, as malignancy cannot be excluded.Hypermetabolic subcutaneous nodule along the medial right shoulder. This the should be amenable to direct Visualization/palpation. Mild hypermetabolism associated with the left breast and left axilla, likely postoperative in etiology. Please correlate clinically. Aortic atherosclerosis and coronary artery calcification.Cirrhosis.       HISTORY OF PRESENTING ILLNESS:  Taylor Williams 65 y.o. female is here for follow-up of stage I triple negative carcinoma of the L breast. She has completed 4 cycles of TC.   Ms. Hemrick is here today unaccompanied. She  feels great today.   She states that the leg swelling is gone. It showed up one day when she was at the beach and ate a lot of foods she shouldn't have, like shrimp. She saw a cardiologist for this, but he said that it wasn't related to her heart. She believes it happened because her body was getting use to the chemotherapy. She says that this leg swelling has happened one other time in her life, several years ago. She was moving into her parents house and wasn't sleeping or eating well.   She says that her breathing has been good. She quit smoking on September 18. She admits to cheating twice, but each time she did she vomited afterwards.   One week, she had two treatments of radiation within one day and she experienced a stabbing pain in her left side. She says that it didn't last long enough to have to take pain medication for. She has been using powder under her breasts to help with the rash she has. Her right side has cleared up, but there is still signs of a small rash under her left breast. She believes that this is because of the double dosage she had.   She doesn't think the radiation had anything to do with the edema.   She has been staying active. She can walk up and down the stairs. She still doesn't do many activities outside of the house, but she is feeling more up to it.  Her appetite is good.   Her mood is good. She sometimes lacks motivation, but she thinks it's just from a little bit of depression.   She has had her flu shot and pneumonia vaccine.  I have went over the labs with the patient.  She's a little confused about who orders what medications for her now. She has requested a refill of Lasix and potassium pills.    MEDICAL HISTORY:  Past Medical History:  Diagnosis Date  . Anxiety   . Depression   . High cholesterol   . Hypertension     SURGICAL HISTORY: Past Surgical History:  Procedure Laterality Date  . BREAST LUMPECTOMY Left    states for ductal  carcinoma, had surgery in late may    SOCIAL HISTORY: Social History   Social History  . Marital status: Widowed    Spouse name: N/A  . Number of children: N/A  . Years of education: N/A   Occupational History  . Not on file.   Social History Main Topics  . Smoking status: Current Every Day Smoker    Packs/day: 1.00    Years: 40.00  . Smokeless tobacco: Never Used  . Alcohol use No  . Drug use: No  . Sexual activity: Not on file   Other Topics Concern  . Not on file   Social History Narrative  . No narrative on file  Widowed for 40 years. Her husband was killed in an MVA. He was in the army and they were living in [place] at the time. No children. She never remarried. She has a close family, a close great nephew. Born in [place]. 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  Smoker cigarettes 1ppd for greater than 40 years.  No ETOH. Retired. Enjoys Haematologist, Barrister's clerk, volunteers at [employer].   FAMILY HISTORY: No family history on file.  Mother deceased at 72 from colon cancer Father deceased at 78 from complications of COPD, dementia One Brother healthy Maternal brother deceased from esophageal cancer Paternal grandmother had colon cancer and grandfather had lung cancer  ALLERGIES:  is allergic to aspirin; other; and fish allergy.  MEDICATIONS:  Current Outpatient Prescriptions  Medication Sig Dispense Refill  . ALPRAZolam (XANAX) 0.25 MG tablet Take 0.25 mg by mouth 2 (two) times daily.    . cetirizine (ZYRTEC) 10 MG tablet Take 10 mg by mouth daily.    . citalopram (CELEXA) 20 MG tablet Take 20 mg by mouth daily.    . Diphenhyd-Hydrocort-Nystatin (FIRST-DUKES MOUTHWASH) SUSP Use as directed 5 mLs in the mouth or throat 4 (four) times daily as needed. 420 mL 2  . diphenoxylate-atropine (LOMOTIL) 2.5-0.025 MG tablet May take 1-2 tabs four times a day prn loose stools. 60 tablet 1  . furosemide (LASIX) 20 MG tablet Take 1  tablet (20 mg total) by mouth daily. 15 tablet 1  . glipiZIDE (GLUCOTROL) 10 MG tablet Take 10 mg by mouth 2 (two) times daily before a meal.    . Krill Oil 500 MG CAPS Take 500 mg by mouth. 1,500 mg BID    . lisinopril (PRINIVIL,ZESTRIL) 5 MG tablet Take 1 tablet (5 mg total) by mouth daily. 90 tablet 3  . loperamide (IMODIUM A-D) 2 MG tablet Take 4 mg by mouth 2 (two) times daily.    Marland Kitchen LORazepam (ATIVAN) 0.5 MG tablet Take 1 tablet (0.5 mg total) by mouth every 4 (four) hours as needed for anxiety. Or as needed for  nausea 60 tablet 0  . metFORMIN (GLUCOPHAGE) 1000 MG tablet Take 1,000 mg by mouth 2 (two) times daily with a meal.    . ondansetron (ZOFRAN) 8 MG tablet Take 1 tablet (8 mg total) by mouth every 8 (eight) hours as needed for nausea or vomiting. 30 tablet 2  . potassium chloride SA (K-DUR,KLOR-CON) 20 MEQ tablet Take 2 tablets (40 mEq total) by mouth daily. 60 tablet 0  . prochlorperazine (COMPAZINE) 10 MG tablet Take 1 tablet (10 mg total) by mouth every 6 (six) hours as needed for nausea or vomiting. 30 tablet 2  . promethazine (PHENERGAN) 25 MG tablet Take 1 tablet (25 mg total) by mouth every 6 (six) hours as needed for nausea or vomiting. 45 tablet 0  . sitaGLIPtin (JANUVIA) 100 MG tablet Take 100 mg by mouth daily.     No current facility-administered medications for this visit.    Review of Systems  Constitutional: Positive for malaise/fatigue (she's been more active than before).  HENT: Negative.   Eyes: Negative.   Respiratory: Negative.   Cardiovascular: Negative.  Negative for leg swelling.       Stabbing pain on left side after double dosage of chemo  Gastrointestinal: Negative.   Genitourinary: Negative.   Skin: Negative.   Neurological: Negative.   Endo/Heme/Allergies: Negative.   Psychiatric/Behavioral: Positive for depression.       Lack of motivation due to slight depression  All other systems reviewed and are negative.  14 point ROS was done and is  otherwise as detailed above or in HPI  PHYSICAL EXAMINATION:  Vitals with BMI 05/31/2016  Height   Weight 226 lbs 5 oz  BMI   Systolic 262  Diastolic 62  Pulse 63  Respirations 16   Physical Exam  Constitutional: She is oriented to person, place, and time and well-developed, well-nourished, and in no distress.  HENT:  Head: Normocephalic and atraumatic.  Nose: Nose normal.  Mouth/Throat: Oropharynx is clear and moist. No oropharyngeal exudate.  Eyes: Conjunctivae and EOM are normal. Pupils are equal, round, and reactive to light. Right eye exhibits no discharge. Left eye exhibits no discharge. No scleral icterus.  Neck: Normal range of motion. Neck supple. No tracheal deviation present. No thyromegaly present.  Cardiovascular: Normal rate, regular rhythm and normal heart sounds.  Exam reveals no gallop and no friction rub.   No murmur heard. Pulmonary/Chest: Effort normal and breath sounds normal. She has no wheezes. She has no rales.  Port site healing, erythematous but no evidence of infection  Abdominal: Soft. Bowel sounds are normal. She exhibits no distension and no mass. There is no tenderness. There is no rebound and no guarding.  Musculoskeletal: Normal range of motion. She exhibits no edema.  Lymphadenopathy:    She has no cervical adenopathy.  Neurological: She is alert and oriented to person, place, and time. She has normal reflexes. No cranial nerve deficit. Gait normal. Coordination normal.  Skin: Skin is warm and dry. No rash noted.  Psychiatric: Mood, memory, affect and judgment normal.  Nursing note and vitals reviewed. Left breast with Radiation changes, well healing  LABORATORY DATA:  I have reviewed the data as listed  Results for LORAL, CAMPI (MRN [id number]) as of 05/31/2016 13:51  Ref. Range 05/31/2016 12:24  Sodium Latest Ref Range: 135 - 145 mmol/L 135  Potassium Latest Ref Range: 3.5 - 5.1 mmol/L 3.6  Chloride Latest Ref Range: 101 - 111 mmol/L 102  CO2  Latest Ref Range: 22 -  32 mmol/L 27  BUN Latest Ref Range: 6 - 20 mg/dL 5 (L)  Creatinine Latest Ref Range: 0.44 - 1.00 mg/dL 0.62  Calcium Latest Ref Range: 8.9 - 10.3 mg/dL 9.2  EGFR (Non-African Amer.) Latest Ref Range: >60 mL/min >60  EGFR (African American) Latest Ref Range: >60 mL/min >60  Glucose Latest Ref Range: 65 - 99 mg/dL 168 (H)  Anion gap Latest Ref Range: 5 - 15  6  Alkaline Phosphatase Latest Ref Range: 38 - 126 U/L 54  Albumin Latest Ref Range: 3.5 - 5.0 g/dL 3.2 (L)  AST Latest Ref Range: 15 - 41 U/L 34  ALT Latest Ref Range: 14 - 54 U/L 19  Total Protein Latest Ref Range: 6.5 - 8.1 g/dL 7.1  Total Bilirubin Latest Ref Range: 0.3 - 1.2 mg/dL 0.6  WBC Latest Ref Range: 4.0 - 10.5 K/uL 5.3  RBC Latest Ref Range: 3.87 - 5.11 MIL/uL 3.92  Hemoglobin Latest Ref Range: 12.0 - 15.0 g/dL 12.2  HCT Latest Ref Range: 36.0 - 46.0 % 36.0  MCV Latest Ref Range: 78.0 - 100.0 fL 91.8  MCH Latest Ref Range: 26.0 - 34.0 pg 31.1  MCHC Latest Ref Range: 30.0 - 36.0 g/dL 33.9  RDW Latest Ref Range: 11.5 - 15.5 % 13.2  Platelets Latest Ref Range: 150 - 400 K/uL 158  Neutrophils Latest Units: % 72  Lymphocytes Latest Units: % 15  Monocytes Relative Latest Units: % 8  Eosinophil Latest Units: % 4  Basophil Latest Units: % 1  NEUT# Latest Ref Range: 1.7 - 7.7 K/uL 3.9  Lymphocyte # Latest Ref Range: 0.7 - 4.0 K/uL 0.8  Monocyte # Latest Ref Range: 0.1 - 1.0 K/uL 0.4  Eosinophils Absolute Latest Ref Range: 0.0 - 0.7 K/uL 0.2  Basophils Absolute Latest Ref Range: 0.0 - 0.1 K/uL 0.0    RADIOGRAPHIC STUDIES: I have personally reviewed the radiological images as listed and agreed with the findings in the report. No results found.   CLINICAL DATA:  Initial treatment strategy for pulmonary nodules.  EXAM: NUCLEAR MEDICINE PET SKULL BASE TO THIGH  TECHNIQUE: 10.7 mCi F-18 FDG was injected intravenously. Full-ring PET imaging was performed from the skull base to thigh after the  radiotracer. CT data was obtained and used for attenuation correction and anatomic localization.  FASTING BLOOD GLUCOSE:  Value: 116 mg/dl  COMPARISON:  CT chest 02/01/2016.  FINDINGS: NECK  No hypermetabolic lymph nodes in the neck. CT images show no acute findings.  CHEST  An irregular nodule in the posterior segment right upper lobe (series 8, image 29), is poorly measured on the current exam due to respiratory motion. It appears grossly similar from 02/01/2016 and has an SUV max of 1.5. Other pulmonary nodules are too small for PET resolution. No hypermetabolic mediastinal, hilar or axillary lymph nodes. A subcutaneous nodule along the medial right shoulder measures 1.4 x 2.1 cm (CT image 34) with an SUV max of 3.8. There is mild hypermetabolism associated with the left breast and left axilla, likely postoperative in etiology. Aortic atherosclerosis and coronary artery calcification. No pericardial or pleural effusion.  ABDOMEN/PELVIS  No abnormal hypermetabolism in the liver, adrenal glands, spleen or pancreas. No hypermetabolic lymph nodes. Liver margin is irregular. Cholecystectomy. Nodular thickening of the right adrenal gland. Left adrenal glands, kidneys, spleen, pancreas, stomach and bowel are grossly unremarkable. Varices are seen in the abdomen. No free fluid.  SKELETON  No abnormal osseous hypermetabolism.  IMPRESSION: 1. Majority of visualized pulmonary nodules  are too small for PET resolution. Largest nodule in the right upper lobe is likely stable and does not show metabolism above blood pool. Follow-up CT chest without contrast in 3 months is recommended in further evaluation, to document stability, as malignancy cannot be excluded. 2. Hypermetabolic subcutaneous nodule along the medial right shoulder. This the should be amenable to direct visualization/palpation. 3. Mild hypermetabolism associated with the left breast and left axilla,  likely postoperative in etiology. Please correlate clinically. 4. Aortic atherosclerosis and coronary artery calcification. 5. Cirrhosis.   Electronically Signed   By: Lorin Picket M.D.   On: 04/03/2016 13:51  ASSESSMENT & PLAN:  Stage I ER- PR- HER 2 neu - carcinoma of the L breast Lumpectomy and sentinel LN biopsy with Dr. Ladona Horns on 11/30/2015 Tobacco Abuse Port a cath placement with Dr. Ladona Horns  Pulmonary nodules, RUL nodule Cirrhosis Hypermetabolic subcutaneous nodule on PET  She is doing well overall. Fatigue is improving. She has quit smoking which is excellent. I have ordered a refill on Lasix and potassium.  I have ordered a non contrast CT of the chest in December for follow-up on pulmonary findings. . I will see her after the chest CT.  She will return for a follow up in 3 months.   Orders Placed This Encounter  Procedures  . CT Chest Wo Contrast    Standing Status:   Future    Standing Expiration Date:   05/31/2017    Order Specific Question:   Reason for Exam (SYMPTOM  OR DIAGNOSIS REQUIRED)    Answer:   follow-up pulmonary nodules    Order Specific Question:   Preferred imaging location?    Answer:   [facility]   Meds ordered this encounter  Medications  . potassium chloride SA (K-DUR,KLOR-CON) 20 MEQ tablet    Sig: Take 1 tablet (20 mEq total) by mouth daily.    Dispense:  90 tablet    Refill:  3  . furosemide (LASIX) 20 MG tablet    Sig: Take 1 tablet (20 mg total) by mouth daily.    Dispense:  90 tablet    Refill:  3    All questions were answered. The patient knows to call the clinic with any problems, questions or concerns.  This note was electronically signed.   This document serves as a record of services personally performed by Ancil Linsey, MD. It was created on her behalf by Martinique Casey, a trained medical scribe. The creation of this record is based on the scribe's personal observations and the provider's statements to them.  This document has been checked and approved by the attending provider.  I have reviewed the above documentation for accuracy and completeness and I agree with the above. Molli Hazard, MD  05/31/2016 8:34 AM

## 2016-05-31 NOTE — Patient Instructions (Addendum)
[facility] at [facility] Discharge Instructions  RECOMMENDATIONS MADE BY THE CONSULTANT AND ANY TEST RESULTS WILL BE SENT TO YOUR REFERRING PHYSICIAN.  You saw Dr.Penland today. A non contrast chest CT will be scheduled for December. Follow up after CT with MD See Amy at checkout for appointments.  Thank you for choosing [place] at [facility] to provide your oncology and hematology care.  To afford each patient quality time with our provider, please arrive at least 15 minutes before your scheduled appointment time.   Beginning January 23rd 2017 lab work for the [facility] will be done in the  Main lab at [facility] on 1st floor. If you have a lab appointment with the [facility] please come in thru the  Main Entrance and check in at the main information desk  You need to re-schedule your appointment should you arrive 10 or more minutes late.  We strive to give you quality time with our providers, and arriving late affects you and other patients whose appointments are after yours.  Also, if you no show three or more times for appointments you may be dismissed from the clinic at the providers discretion.     Again, thank you for choosing [facility].  Our hope is that these requests will decrease the amount of time that you wait before being seen by our physicians.       _____________________________________________________________  Should you have questions after your visit to [facility], please contact our office at [phone] between the hours of 8:30 a.m. and 4:30 p.m.  Voicemails left after 4:30 p.m. will not be returned until the following business day.  For prescription refill requests, have your pharmacy contact our office.         Resources For Cancer Patients and their Caregivers ? American Cancer Society: Can assist with transportation, wigs, general needs, runs Look Good Feel Better.         725-241-3220 ? Cancer Care: Provides financial assistance, online support groups, medication/co-pay assistance.  1-800-813-HOPE 580-639-2886) ? [place] Assists [place] cancer patients and their families through emotional , educational and financial support.  (907) 853-2310 ? [place] DSS Where to apply for food stamps, Medicaid and utility assistance. 8453649670 ? [facility]: Transportation to medical appointments. 304-163-1431 ? Social Security Administration: May apply for disability if have a Stage IV cancer. (505) 781-8970 407-142-2200 ? [employer], Disability and Transit Services: Assists with nutrition, care and transit needs. [facility] Support Programs: @10RELATIVEDAYS @ > Cancer Support Group  2nd Tuesday of the month 1pm-2pm, Journey Room  > Creative Journey  3rd Tuesday of the month 1130am-1pm, Journey Room  > Look Good Feel Better  1st Wednesday of the month 10am-12 noon, Journey Room (Call [facility] to register [phone])

## 2016-06-03 ENCOUNTER — Other Ambulatory Visit: Payer: Self-pay

## 2016-06-03 MED ORDER — LISINOPRIL 5 MG PO TABS: 5.0000 mg | ORAL_TABLET | Freq: Every day | ORAL | 3 refills | Status: DC

## 2016-06-11 DIAGNOSIS — Z171 Estrogen receptor negative status [ER-]: Secondary | ICD-10-CM | POA: Diagnosis not present

## 2016-06-11 DIAGNOSIS — Z923 Personal history of irradiation: Secondary | ICD-10-CM | POA: Diagnosis not present

## 2016-06-11 DIAGNOSIS — C50412 Malignant neoplasm of upper-outer quadrant of left female breast: Secondary | ICD-10-CM | POA: Diagnosis not present

## 2016-06-11 DIAGNOSIS — Z87891 Personal history of nicotine dependence: Secondary | ICD-10-CM | POA: Diagnosis not present

## 2016-06-17 DIAGNOSIS — D0512 Intraductal carcinoma in situ of left breast: Secondary | ICD-10-CM | POA: Diagnosis not present

## 2016-06-24 ENCOUNTER — Encounter (HOSPITAL_COMMUNITY): Payer: Self-pay | Admitting: Hematology & Oncology

## 2016-07-15 ENCOUNTER — Ambulatory Visit: Payer: Medicare Other | Admitting: Adult Health

## 2016-07-15 NOTE — Progress Notes (Deleted)
Cardiology Office Note   Date:  07/15/2016   ID:  Grant Henkes, DOB 1950/12/23, MRN [id number]  PCP:  [name], MD  Cardiologist: Lamar Sprinkles, NP   No chief complaint on file.     History of Present Illness: Taylor Williams is a 65 y.o. female who presents for ongoing assessment and management of chronic edema. She was first seen by Dr. Olive Bass and on 05/20/2016. She is being treated by oncology for left breast cancer with chemotherapy and lumpectomy. Patient had gained 20 pounds over the last month with no evidence of chest pain orthopnea or PND.  Echocardiogram had been completed revealing mild LVH with an LVEF of 60-65% and grade 2 diastolic dysfunction. Edema did not appear to be cardiac related EF normal and doubt edema related to diastolic abnormalities. The patient was continued on Lasix. Lisinopril/HCTZ was stopped, and changed to only lisinopril 5 mg daily for renal protection. Follow-up labs included a 24-hour urine, BNP, creatinine, TSH, cortisol.  24-hour urine dated 05/21/2016: 5-HA AAA urine 1.8; 5-HIAA, Quant 3.3. Most recent labs on 05/31/2016 sodium 135 potassium 3.6 chloride 102 CO2 27 creatinine 0.62 hemoglobin 12.2 hematocrit 36.0 white blood cells 5.3 platelets 158. Cortisol dated 05/20/2016 9.1. TSH 1.014. Marland Kitchen     Past Medical History:  Diagnosis Date  . Anxiety   . Depression   . High cholesterol   . Hypertension     Past Surgical History:  Procedure Laterality Date  . BREAST LUMPECTOMY Left    states for ductal carcinoma, had surgery in late may     Current Outpatient Prescriptions  Medication Sig Dispense Refill  . ALPRAZolam (XANAX) 0.25 MG tablet Take 0.25 mg by mouth 2 (two) times daily.    . cetirizine (ZYRTEC) 10 MG tablet Take 10 mg by mouth daily.    . citalopram (CELEXA) 20 MG tablet Take 20 mg by mouth daily.    . Diphenhyd-Hydrocort-Nystatin (FIRST-DUKES MOUTHWASH) SUSP Use as directed 5 mLs in the mouth or throat 4 (four) times  daily as needed. 420 mL 2  . diphenoxylate-atropine (LOMOTIL) 2.5-0.025 MG tablet May take 1-2 tabs four times a day prn loose stools. 60 tablet 1  . furosemide (LASIX) 20 MG tablet Take 1 tablet (20 mg total) by mouth daily. 90 tablet 3  . glipiZIDE (GLUCOTROL) 10 MG tablet Take 10 mg by mouth 2 (two) times daily before a meal.    . Krill Oil 500 MG CAPS Take 500 mg by mouth. 1,500 mg BID    . lisinopril (PRINIVIL,ZESTRIL) 5 MG tablet Take 1 tablet (5 mg total) by mouth daily. 90 tablet 3  . loperamide (IMODIUM A-D) 2 MG tablet Take 4 mg by mouth 2 (two) times daily.    Marland Kitchen LORazepam (ATIVAN) 0.5 MG tablet Take 1 tablet (0.5 mg total) by mouth every 4 (four) hours as needed for anxiety. Or as needed for nausea 60 tablet 0  . metFORMIN (GLUCOPHAGE) 1000 MG tablet Take 1,000 mg by mouth 2 (two) times daily with a meal.    . ondansetron (ZOFRAN) 8 MG tablet Take 1 tablet (8 mg total) by mouth every 8 (eight) hours as needed for nausea or vomiting. 30 tablet 2  . potassium chloride SA (K-DUR,KLOR-CON) 20 MEQ tablet Take 1 tablet (20 mEq total) by mouth daily. 90 tablet 3  . prochlorperazine (COMPAZINE) 10 MG tablet Take 1 tablet (10 mg total) by mouth every 6 (six) hours as needed for nausea or vomiting. 30 tablet 2  .  promethazine (PHENERGAN) 25 MG tablet Take 1 tablet (25 mg total) by mouth every 6 (six) hours as needed for nausea or vomiting. 45 tablet 0  . sitaGLIPtin (JANUVIA) 100 MG tablet Take 100 mg by mouth daily.     No current facility-administered medications for this visit.     Allergies:   Aspirin; Other; and Fish allergy    Social History:  The patient  reports that she has been smoking.  She has a 40.00 pack-year smoking history. She has never used smokeless tobacco. She reports that she does not drink alcohol or use drugs.   Family History:  The patient's family history is not on file.    ROS: All other systems are reviewed and negative. Unless otherwise mentioned in H&P     PHYSICAL EXAM: VS:  There were no vitals taken for this visit. , BMI There is no height or weight on file to calculate BMI. GEN: Well nourished, well developed, in no acute distress  HEENT: normal  Neck: no JVD, carotid bruits, or masses Cardiac: ***RRR; no murmurs, rubs, or gallops,no edema  Respiratory:  clear to auscultation bilaterally, normal work of breathing GI: soft, nontender, nondistended, + BS MS: no deformity or atrophy  Skin: warm and dry, no rash Neuro:  Strength and sensation are intact Psych: euthymic mood, full affect   EKG:  EKG {ACTION; IS/IS EML:54492010} ordered today. The ekg ordered today demonstrates ***   Recent Labs: 04/25/2016: B Natriuretic Peptide 153.0 05/20/2016: TSH 1.014 05/31/2016: ALT 19; BUN 5; Creatinine, Ser 0.62; Hemoglobin 12.2; Platelets 158; Potassium 3.6; Sodium 135    Lipid Panel No results found for: CHOL, TRIG, HDL, CHOLHDL, VLDL, LDLCALC, LDLDIRECT    Wt Readings from Last 3 Encounters:  05/31/16 226 lb 4.8 oz (102.6 kg)  05/20/16 214 lb 12.8 oz (97.4 kg)  04/04/16 218 lb 12.8 oz (99.2 kg)      Other studies Reviewed: Additional studies/ records that were reviewed today include: ***. Review of the above records demonstrates: ***   ASSESSMENT AND PLAN:  1.  ***   Current medicines are reviewed at length with the patient today.    Labs/ tests ordered today include: *** No orders of the defined types were placed in this encounter.    Disposition:   FU with *** in {gen number [id number]} {TIME; UNITS DAY/WEEK/MONTH:19136}   Signed, Jory Sims, NP  07/15/2016 6:59 AM    [place]  [address] [place], [place] [address] Phone: [phone]; Fax: [phone]

## 2016-07-16 ENCOUNTER — Ambulatory Visit (HOSPITAL_COMMUNITY)
Admission: RE | Admit: 2016-07-16 | Discharge: 2016-07-16 | Disposition: A | Discharge status: Home / Self Care | Payer: Medicare Other | Source: Ambulatory Visit | Attending: Hematology & Oncology | Admitting: Hematology & Oncology

## 2016-07-16 DIAGNOSIS — I2510 Atherosclerotic heart disease of native coronary artery without angina pectoris: Secondary | ICD-10-CM | POA: Diagnosis not present

## 2016-07-16 DIAGNOSIS — I700 Atherosclerosis of aorta: Secondary | ICD-10-CM | POA: Insufficient documentation

## 2016-07-16 DIAGNOSIS — R918 Other nonspecific abnormal finding of lung field: Secondary | ICD-10-CM | POA: Insufficient documentation

## 2016-07-17 ENCOUNTER — Ambulatory Visit (HOSPITAL_COMMUNITY): Payer: Medicare Other | Admitting: Oncology

## 2016-07-19 DIAGNOSIS — I10 Essential (primary) hypertension: Secondary | ICD-10-CM | POA: Diagnosis not present

## 2016-07-19 DIAGNOSIS — E1165 Type 2 diabetes mellitus with hyperglycemia: Secondary | ICD-10-CM | POA: Diagnosis not present

## 2016-07-19 DIAGNOSIS — F3289 Other specified depressive episodes: Secondary | ICD-10-CM | POA: Diagnosis not present

## 2016-07-25 ENCOUNTER — Encounter (HOSPITAL_COMMUNITY): Payer: Self-pay | Admitting: Oncology

## 2016-07-25 ENCOUNTER — Encounter (HOSPITAL_COMMUNITY): Payer: Medicare Other | Attending: Oncology | Admitting: Oncology

## 2016-07-25 VITALS — BP 131/61 | HR 65 | Temp 97.8°F | Resp 20 | Wt 227.5 lb

## 2016-07-25 DIAGNOSIS — Z171 Estrogen receptor negative status [ER-]: Secondary | ICD-10-CM

## 2016-07-25 DIAGNOSIS — Z9889 Other specified postprocedural states: Secondary | ICD-10-CM | POA: Insufficient documentation

## 2016-07-25 DIAGNOSIS — R911 Solitary pulmonary nodule: Secondary | ICD-10-CM | POA: Insufficient documentation

## 2016-07-25 DIAGNOSIS — F419 Anxiety disorder, unspecified: Secondary | ICD-10-CM | POA: Insufficient documentation

## 2016-07-25 DIAGNOSIS — Z79899 Other long term (current) drug therapy: Secondary | ICD-10-CM | POA: Insufficient documentation

## 2016-07-25 DIAGNOSIS — Z720 Tobacco use: Secondary | ICD-10-CM | POA: Diagnosis not present

## 2016-07-25 DIAGNOSIS — R918 Other nonspecific abnormal finding of lung field: Secondary | ICD-10-CM

## 2016-07-25 DIAGNOSIS — F17210 Nicotine dependence, cigarettes, uncomplicated: Secondary | ICD-10-CM | POA: Insufficient documentation

## 2016-07-25 DIAGNOSIS — C50912 Malignant neoplasm of unspecified site of left female breast: Secondary | ICD-10-CM | POA: Insufficient documentation

## 2016-07-25 DIAGNOSIS — C50412 Malignant neoplasm of upper-outer quadrant of left female breast: Secondary | ICD-10-CM

## 2016-07-25 DIAGNOSIS — K7460 Unspecified cirrhosis of liver: Secondary | ICD-10-CM

## 2016-07-25 DIAGNOSIS — Z7984 Long term (current) use of oral hypoglycemic drugs: Secondary | ICD-10-CM | POA: Insufficient documentation

## 2016-07-25 DIAGNOSIS — F329 Major depressive disorder, single episode, unspecified: Secondary | ICD-10-CM | POA: Insufficient documentation

## 2016-07-25 DIAGNOSIS — I10 Essential (primary) hypertension: Secondary | ICD-10-CM | POA: Insufficient documentation

## 2016-07-25 NOTE — Addendum Note (Signed)
Addended by: Gerhard Perches on: 07/25/2016 12:38 PM   Modules accepted: Orders

## 2016-07-25 NOTE — Progress Notes (Signed)
Taylor Williams  Progress Note  Patient Care Team: [name], MD as PCP - General (Internal Medicine)  CHIEF COMPLAINTS:    Carcinoma of upper-outer quadrant of left female breast ([place])   11/30/2015 Surgery    Lumpectomy and sentinel node procedure with Dr. Ladona Horns      11/30/2015 Surgery    Needle localization of the L breast with mammo guidance      11/30/2015 Pathology Results    L breast invasive ductal carcinoma, mod diff, 0.8 cm, focal DCIS, high grade, 2/2 LN negative, no LVI, pT1b, pN0M0, ER- PR- HER2 - Ki-67 92%      01/12/2016 - 03/15/2016 Chemotherapy    TC x 4 cycles      02/01/2016 Imaging    CT chest screening- Lung-RADS Category 4B, suspicious. Additional imaging evaluation or consultation with pulmonary medicine or thoracic surgery recommended. Bilateral pulmonary nodules, including an inferior right upper lobe spiculated 11 mm nodule.       03/13/2016 Procedure    Port removed by Dr. Ladona Horns due to concerns for infection.      03/14/2016 Procedure    PICC line placed      04/03/2016 PET scan    Majority of visualized pulmonary nodules are too small for PET resolution. Largest nodule in the right upper lobe is likely stable and does not show metabolism above blood pool. Follow-up CT chest without contrast in 3 months is recommended in further evaluation, to document stability, as malignancy cannot be excluded.Hypermetabolic subcutaneous nodule along the medial right shoulder. This the should be amenable to direct Visualization/palpation. Mild hypermetabolism associated with the left breast and left axilla, likely postoperative in etiology. Please correlate clinically. Aortic atherosclerosis and coronary artery calcification.Cirrhosis.      S finished radiation therapy to the left breast (November, 2017) HISTORY OF PRESENTING ILLNESS:  Taylor Williams 65 y.o. female is here for follow-up of stage I triple negative carcinoma of the L breast. Taylor Williams has  completed 4 cycles of TC.   Taylor Williams is here today unaccompanied. Taylor Williams feels great today.   Taylor Williams states that the leg swelling is gone. It showed up one day when Taylor Williams was at the beach and ate a lot of foods Taylor Williams shouldn't have, like shrimp. Taylor Williams saw a cardiologist for this, but he said that it wasn't related to her heart. Taylor Williams believes it happened because her body was getting use to the chemotherapy. Taylor Williams says that this leg swelling has happened one other time in her life, several years ago. Taylor Williams was moving into her parents house and wasn't sleeping or eating well.   Taylor Williams says that her breathing has been good. Taylor Williams quit smoking on September 18. Taylor Williams admits to cheating twice, but each time Taylor Williams did Taylor Williams vomited afterwards.   Patient has recently finished radiation therapy to the left breast Port has been removed Had a swelling of both lower extremity was evaluated by cardiology is no abnormality was found patient responded well to LASIX  Patient also had a CT scan for follow-up regarding right upper lobe lung nodule which has been reviewed independently Patient is here for ongoing evaluation and treatment consideration      MEDICAL HISTORY:  Past Medical History:  Diagnosis Date  . Anxiety   . Depression   . High cholesterol   . Hypertension     SURGICAL HISTORY: Past Surgical History:  Procedure Laterality Date  . BREAST LUMPECTOMY Left    states for ductal carcinoma, had  surgery in late may    SOCIAL HISTORY: Social History   Social History  . Marital status: Widowed    Spouse name: N/A  . Number of children: N/A  . Years of education: N/A   Occupational History  . Not on file.   Social History Main Topics  . Smoking status: Current Every Day Smoker    Packs/day: 1.00    Years: 40.00  . Smokeless tobacco: Never Used  . Alcohol use No  . Drug use: No  . Sexual activity: Not on file   Other Topics Concern  . Not on file   Social History Narrative  . No narrative on file    Widowed for 40 years. Her husband was killed in an MVA. He was in the army and they were living in [place] at the time. No children. Taylor Williams never remarried. Taylor Williams has a close family, a close great nephew. Born in [place]. 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  Smoker cigarettes 1ppd for greater than 40 years.  No ETOH. Retired. Enjoys Haematologist, Barrister's clerk, volunteers at [employer].   FAMILY HISTORY: No family history on file.  Mother deceased at 28 from colon cancer Father deceased at 53 from complications of COPD, dementia One Brother healthy Maternal brother deceased from esophageal cancer Paternal grandmother had colon cancer and grandfather had lung cancer  ALLERGIES:  is allergic to aspirin; other; and fish allergy.  MEDICATIONS:  Current Outpatient Prescriptions  Medication Sig Dispense Refill  . ALPRAZolam (XANAX) 0.25 MG tablet Take 0.25 mg by mouth 2 (two) times daily.    . cetirizine (ZYRTEC) 10 MG tablet Take 10 mg by mouth daily.    . citalopram (CELEXA) 20 MG tablet Take 20 mg by mouth daily.    . Diphenhyd-Hydrocort-Nystatin (FIRST-DUKES MOUTHWASH) SUSP Use as directed 5 mLs in the mouth or throat 4 (four) times daily as needed. 420 mL 2  . diphenoxylate-atropine (LOMOTIL) 2.5-0.025 MG tablet May take 1-2 tabs four times a day prn loose stools. 60 tablet 1  . furosemide (LASIX) 20 MG tablet Take 1 tablet (20 mg total) by mouth daily. 90 tablet 3  . glipiZIDE (GLUCOTROL) 10 MG tablet Take 10 mg by mouth 2 (two) times daily before a meal.    . Krill Oil 500 MG CAPS Take 500 mg by mouth. 1,500 mg BID    . lisinopril (PRINIVIL,ZESTRIL) 5 MG tablet Take 1 tablet (5 mg total) by mouth daily. 90 tablet 3  . loperamide (IMODIUM A-D) 2 MG tablet Take 4 mg by mouth 2 (two) times daily.    Marland Kitchen LORazepam (ATIVAN) 0.5 MG tablet Take 1 tablet (0.5 mg total) by mouth every 4 (four) hours as needed for anxiety. Or as needed for nausea 60 tablet 0  .  metFORMIN (GLUCOPHAGE) 1000 MG tablet Take 1,000 mg by mouth 2 (two) times daily with a meal.    . ondansetron (ZOFRAN) 8 MG tablet Take 1 tablet (8 mg total) by mouth every 8 (eight) hours as needed for nausea or vomiting. 30 tablet 2  . potassium chloride SA (K-DUR,KLOR-CON) 20 MEQ tablet Take 1 tablet (20 mEq total) by mouth daily. 90 tablet 3  . prochlorperazine (COMPAZINE) 10 MG tablet Take 1 tablet (10 mg total) by mouth every 6 (six) hours as needed for nausea or vomiting. 30 tablet 2  . promethazine (PHENERGAN) 25 MG tablet Take 1 tablet (25 mg total) by mouth every 6 (six) hours as  needed for nausea or vomiting. 45 tablet 0  . sitaGLIPtin (JANUVIA) 100 MG tablet Take 100 mg by mouth daily.     No current facility-administered medications for this visit.    Review of Systems  Constitutional: Positive for malaise/fatigue (Taylor Williams's been more active than before).  HENT: Negative.   Eyes: Negative.   Respiratory: Negative.   Cardiovascular: Negative.  Negative for leg swelling.       Stabbing pain on left side after double dosage of chemo  Gastrointestinal: Negative.   Genitourinary: Negative.   Skin: Negative.   Neurological: Negative.   Endo/Heme/Allergies: Negative.   Psychiatric/Behavioral: Positive for depression.       Lack of motivation due to slight depression  All other systems reviewed and are negative.  14 point ROS was done and is otherwise as detailed above or in HPI  PHYSICAL EXAMINATION:  Vitals with BMI 05/31/2016  Height   Weight 226 lbs 5 oz  BMI   Systolic 962  Diastolic 62  Pulse 63  Respirations 16   Physical Exam  Constitutional: Taylor Williams is oriented to person, place, and time and well-developed, well-nourished, and in no distress.  HENT:  Head: Normocephalic and atraumatic.  Nose: Nose normal.  Mouth/Throat: Oropharynx is clear and moist. No oropharyngeal exudate.  Eyes: Conjunctivae and EOM are normal. Pupils are equal, round, and reactive to light.  Right eye exhibits no discharge. Left eye exhibits no discharge. No scleral icterus.  Neck: Normal range of motion. Neck supple. No tracheal deviation present. No thyromegaly present.  Cardiovascular: Normal rate, regular rhythm and normal heart sounds.  Exam reveals no gallop and no friction rub.   No murmur heard. Pulmonary/Chest: Effort normal and breath sounds normal. Taylor Williams has no wheezes. Taylor Williams has no rales.  Port site healing, erythematous but no evidence of infection  Abdominal: Soft. Bowel sounds are normal. Taylor Williams exhibits no distension and no mass. There is no tenderness. There is no rebound and no guarding.  Musculoskeletal: Normal range of motion. Taylor Williams exhibits no edema.  Lymphadenopathy:    Taylor Williams has no cervical adenopathy.  Neurological: Taylor Williams is alert and oriented to person, place, and time. Taylor Williams has normal reflexes. No cranial nerve deficit. Gait normal. Coordination normal.  Skin: Skin is warm and dry. No rash noted.  Psychiatric: Mood, memory, affect and judgment normal.  Nursing note and vitals reviewed. Left breast with Radiation changes, well healing  LABORATORY DATA:  I have reviewed the data as listed  Results for GWYNN, CHALKER (MRN [id number]) as of 05/31/2016 13:51  Ref. Range 05/31/2016 12:24  Sodium Latest Ref Range: 135 - 145 mmol/L 135  Potassium Latest Ref Range: 3.5 - 5.1 mmol/L 3.6  Chloride Latest Ref Range: 101 - 111 mmol/L 102  CO2 Latest Ref Range: 22 - 32 mmol/L 27  BUN Latest Ref Range: 6 - 20 mg/dL 5 (L)  Creatinine Latest Ref Range: 0.44 - 1.00 mg/dL 0.62  Calcium Latest Ref Range: 8.9 - 10.3 mg/dL 9.2  EGFR (Non-African Amer.) Latest Ref Range: >60 mL/min >60  EGFR (African American) Latest Ref Range: >60 mL/min >60  Glucose Latest Ref Range: 65 - 99 mg/dL 168 (H)  Anion gap Latest Ref Range: 5 - 15  6  Alkaline Phosphatase Latest Ref Range: 38 - 126 U/L 54  Albumin Latest Ref Range: 3.5 - 5.0 g/dL 3.2 (L)  AST Latest Ref Range: 15 - 41 U/L 34  ALT Latest Ref  Range: 14 - 54 U/L 19  Total Protein Latest  Ref Range: 6.5 - 8.1 g/dL 7.1  Total Bilirubin Latest Ref Range: 0.3 - 1.2 mg/dL 0.6  WBC Latest Ref Range: 4.0 - 10.5 K/uL 5.3  RBC Latest Ref Range: 3.87 - 5.11 MIL/uL 3.92  Hemoglobin Latest Ref Range: 12.0 - 15.0 g/dL 12.2  HCT Latest Ref Range: 36.0 - 46.0 % 36.0  MCV Latest Ref Range: 78.0 - 100.0 fL 91.8  MCH Latest Ref Range: 26.0 - 34.0 pg 31.1  MCHC Latest Ref Range: 30.0 - 36.0 g/dL 33.9  RDW Latest Ref Range: 11.5 - 15.5 % 13.2  Platelets Latest Ref Range: 150 - 400 K/uL 158  Neutrophils Latest Units: % 72  Lymphocytes Latest Units: % 15  Monocytes Relative Latest Units: % 8  Eosinophil Latest Units: % 4  Basophil Latest Units: % 1  NEUT# Latest Ref Range: 1.7 - 7.7 K/uL 3.9  Lymphocyte # Latest Ref Range: 0.7 - 4.0 K/uL 0.8  Monocyte # Latest Ref Range: 0.1 - 1.0 K/uL 0.4  Eosinophils Absolute Latest Ref Range: 0.0 - 0.7 K/uL 0.2  Basophils Absolute Latest Ref Range: 0.0 - 0.1 K/uL 0.0    RADIOGRAPHIC STUDIES: I have personally reviewed the radiological images as listed and agreed with the findings in the report. No results found.   CLINICAL DATA:  Initial treatment strategy for pulmonary nodules.  CT-scan of the chest  An irregular nodule in the posterior segment right upper lobe (series 8, image 29), is poorly measured on the current exam due to respiratory motion. It appears grossly similar from 02/01/2016 and has an SUV max of 1.5. Other pulmonary nodules are too small for PET re  ASSESSMENT & PLAN:  Stage I ER- PR- HER 2 neu - carcinoma of the L breast Lumpectomy and sentinel LN biopsy with Dr. Ladona Horns on 11/30/2015 Tobacco Abuse Port a cath placement with Dr. Ladona Horns  Pulmonary nodules, RUL nodule Cirrhosis Hypermetabolic subcutaneous nodule on PET  Swelling is improved.  CT scan has been reviewed and remains stable.  Six-month follow-up has been recommended. According to patient's he has quit smoking in  September of 20 17 A CT scan has been arranged for pulmonary nodule follow-up.  Previously mentioned hypermetabolic nodule is not seen on the CT scan recently and is not palpable on clinical examination at present time Bilateral diagnostic mammogram has been arranged Reevaluation in 3 months with repeat CBC and CMP Forest Gleason, MD  07/25/2016 10:43 AM

## 2016-07-25 NOTE — Patient Instructions (Addendum)
[place] at [facility] Discharge Instructions  RECOMMENDATIONS MADE BY THE CONSULTANT AND ANY TEST RESULTS WILL BE SENT TO YOUR REFERRING PHYSICIAN.   Next mammogram due in April 2018   You are still wheezing - please completely stop smoking   Ct scan showed that the same nodule is stable. We will do another CT scan in 6 months for f/u of the pulmonary nodule.   Return to see Dr. Whitney Muse with labs in 4 months     Thank you for choosing [place] at [facility] to provide your oncology and hematology care.  To afford each patient quality time with our provider, please arrive at least 15 minutes before your scheduled appointment time.    If you have a lab appointment with the [facility] please come in thru the  Main Entrance and check in at the main information desk  You need to re-schedule your appointment should you arrive 10 or more minutes late.  We strive to give you quality time with our providers, and arriving late affects you and other patients whose appointments are after yours.  Also, if you no show three or more times for appointments you may be dismissed from the clinic at the providers discretion.     Again, thank you for choosing [facility].  Our hope is that these requests will decrease the amount of time that you wait before being seen by our physicians.       _____________________________________________________________  Should you have questions after your visit to [facility], please contact our office at [phone] between the hours of 8:30 a.m. and 4:30 p.m.  Voicemails left after 4:30 p.m. will not be returned until the following business day.  For prescription refill requests, have your pharmacy contact our office.       Resources For Cancer Patients and their Caregivers ? American Cancer Society: Can assist with transportation, wigs, general needs, runs Look Good Feel Better.         573-042-0696 ? Cancer Care: Provides financial assistance, online support groups, medication/co-pay assistance.  1-800-813-HOPE (629)484-4820) ? [place] Assists [place] cancer patients and their families through emotional , educational and financial support.  307-463-9125 ? [place] DSS Where to apply for food stamps, Medicaid and utility assistance. 7056879887 ? [facility]: Transportation to medical appointments. 725-574-3356 ? Social Security Administration: May apply for disability if have a Stage IV cancer. 303-563-0501 505-448-5405 ? [employer], Disability and Transit Services: Assists with nutrition, care and transit needs. [address] Support Programs: @10RELATIVEDAYS @ > Cancer Support Group  2nd Tuesday of the month 1pm-2pm, Journey Room  > Creative Journey  3rd Tuesday of the month 1130am-1pm, Journey Room  > Look Good Feel Better  1st Wednesday of the month 10am-12 noon, Journey Room (Call [place] to register [phone])

## 2016-07-26 ENCOUNTER — Ambulatory Visit (INDEPENDENT_AMBULATORY_CARE_PROVIDER_SITE_OTHER): Payer: Medicare Other | Admitting: Adult Health

## 2016-07-26 ENCOUNTER — Encounter: Payer: Self-pay | Admitting: Adult Health

## 2016-07-26 VITALS — BP 128/68 | HR 86 | Ht 68.0 in | Wt 223.0 lb

## 2016-07-26 DIAGNOSIS — I10 Essential (primary) hypertension: Secondary | ICD-10-CM

## 2016-07-26 DIAGNOSIS — I5189 Other ill-defined heart diseases: Secondary | ICD-10-CM

## 2016-07-26 DIAGNOSIS — I519 Heart disease, unspecified: Secondary | ICD-10-CM | POA: Diagnosis not present

## 2016-07-26 NOTE — Patient Instructions (Signed)
Your physician recommends that you schedule a follow-up appointment in: 3 Months   Your physician recommends that you continue on your current medications as directed. Please refer to the Current Medication list given to you today.  Your physician has requested that you have an echocardiogram. Echocardiography is a painless test that uses sound waves to create images of your heart. It provides your doctor with information about the size and shape of your heart and how well your heart's chambers and valves are working. This procedure takes approximately one hour. There are no restrictions for this procedure.  If you need a refill on your cardiac medications before your next appointment, please call your pharmacy.  Thank you for choosing [place]!

## 2016-07-26 NOTE — Progress Notes (Signed)
Cardiology Office Note   Date:  07/26/2016   ID:  Taylor Williams, DOB 1951-04-22, MRN [id number]  PCP:  [name], MD  Cardiologist: Lamar Sprinkles, NP   No chief complaint on file.     History of Present Illness: Taylor Williams is a 65 y.o. female who presents for ongoing assessment and management of cardiovascular risk factors, grade 2 diastolic dysfunction with intermittent left ventricular filling pressure, hypertension, hypercholesterolemia, other history includes breast cancer being followed by Dr. Whitney Muse, ongoing tobacco abuse and diabetes.  The patient was last seen by Dr. Johnsie Cancel on 05/20/2016, and was found to have some lower extremity edema. He did not feel it was related to LV systolic function and doubt related to "diastolic abnormalities. She was continued on Lasix. She was finally hypotensive with dizziness and lisinopril HCTZ was stopped, she was only put on lisinopril 5 mg for renal protection. Follow-up TSH cortisol BMET and 24-hour urine for protein was ordered.  Labs dated 05/31/2016 sodium 135, potassium 3.6, chloride 102, CO2 27, BUN 5, creatinine 0.62. Hemoglobin 12.2, hematocrit 36.0, white blood cells 5.3, platelets 158. TSH 1.014. Cortisol 9.1  She is here today without any further complaints. Her chemotherapy and radiation was completed just before Thanksgiving 2017. Her energy is beginning to return. She denies any further complaints of lower extremity edema or dizziness.  Past Medical History:  Diagnosis Date  . Anxiety   . Depression   . High cholesterol   . Hypertension     Past Surgical History:  Procedure Laterality Date  . BREAST LUMPECTOMY Left    states for ductal carcinoma, had surgery in late may     Current Outpatient Prescriptions  Medication Sig Dispense Refill  . ALPRAZolam (XANAX) 0.25 MG tablet Take 0.25 mg by mouth 2 (two) times daily.    . cetirizine (ZYRTEC) 10 MG tablet Take 10 mg by mouth daily.    . citalopram (CELEXA)  20 MG tablet Take 20 mg by mouth daily.    . diphenoxylate-atropine (LOMOTIL) 2.5-0.025 MG tablet May take 1-2 tabs four times a day prn loose stools. 60 tablet 1  . furosemide (LASIX) 20 MG tablet Take 1 tablet (20 mg total) by mouth daily. 90 tablet 3  . glipiZIDE (GLUCOTROL) 10 MG tablet Take 10 mg by mouth 2 (two) times daily before a meal.    . Krill Oil 500 MG CAPS Take 500 mg by mouth. 1,500 mg BID    . lisinopril (PRINIVIL,ZESTRIL) 5 MG tablet Take 1 tablet (5 mg total) by mouth daily. 90 tablet 3  . loperamide (IMODIUM A-D) 2 MG tablet Take 4 mg by mouth 2 (two) times daily.    Marland Kitchen LORazepam (ATIVAN) 0.5 MG tablet Take 1 tablet (0.5 mg total) by mouth every 4 (four) hours as needed for anxiety. Or as needed for nausea 60 tablet 0  . metFORMIN (GLUCOPHAGE) 1000 MG tablet Take 1,000 mg by mouth 2 (two) times daily with a meal.    . ondansetron (ZOFRAN) 8 MG tablet Take 1 tablet (8 mg total) by mouth every 8 (eight) hours as needed for nausea or vomiting. 30 tablet 2  . potassium chloride SA (K-DUR,KLOR-CON) 20 MEQ tablet Take 1 tablet (20 mEq total) by mouth daily. 90 tablet 3  . prochlorperazine (COMPAZINE) 10 MG tablet Take 1 tablet (10 mg total) by mouth every 6 (six) hours as needed for nausea or vomiting. 30 tablet 2  . promethazine (PHENERGAN) 25 MG tablet Take 1 tablet (  25 mg total) by mouth every 6 (six) hours as needed for nausea or vomiting. 45 tablet 0  . sitaGLIPtin (JANUVIA) 100 MG tablet Take 100 mg by mouth daily.     No current facility-administered medications for this visit.     Allergies:   Aspirin; Other; and Fish allergy    Social History:  The patient  reports that she has been smoking.  She has a 40.00 pack-year smoking history. She has never used smokeless tobacco. She reports that she does not drink alcohol or use drugs.   Family History:  The patient's family history includes Cancer in her paternal grandfather and paternal grandmother; Cancer - Other in her  mother; Dementia in her father; Diabetes in her father; Hypertension in her father; Hyperthyroidism in her brother.    ROS: All other systems are reviewed and negative. Unless otherwise mentioned in H&P    PHYSICAL EXAM: VS:  BP 128/68   Pulse 86   Ht 5' 8"  (1.727 m)   Wt 223 lb (101.2 kg)   SpO2 98%   BMI 33.91 kg/m  , BMI Body mass index is 33.91 kg/m. GEN: Well nourished, well developed, in no acute distress Obese. HEENT: normal  Neck: no JVD, carotid bruits, or masses Cardiac: RRR; no murmurs, rubs, or gallops,no edema  Respiratory:  clear to auscultation bilaterally, normal work of breathing GI: soft, nontender, nondistended, + BS MS: no deformity or atrophy  Skin: warm and dry, no rash Neuro:  Strength and sensation are intact Psych: euthymic mood, full affect   Recent Labs: 04/25/2016: B Natriuretic Peptide 153.0 05/20/2016: TSH 1.014 05/31/2016: ALT 19; BUN 5; Creatinine, Ser 0.62; Hemoglobin 12.2; Platelets 158; Potassium 3.6; Sodium 135     Wt Readings from Last 3 Encounters:  07/26/16 223 lb (101.2 kg)  07/25/16 227 lb 8 oz (103.2 kg)  05/31/16 226 lb 4.8 oz (102.6 kg)      Other studies Reviewed: Echocardiogram: 04/25/2016 Left ventricle: The cavity size was normal. Wall thickness was   increased in a pattern of mild LVH. Systolic function was normal.   The estimated ejection fraction was in the range of 60% to 65%.   Wall motion was normal; there were no regional wall motion   abnormalities. Features are consistent with a pseudonormal left   ventricular filling pattern, with concomitant abnormal relaxation   and increased filling pressure (grade 2 diastolic dysfunction). - Aortic valve: Mildly calcified annulus. Trileaflet. - Mitral valve: Calcified annulus. There was trivial regurgitation. - Left atrium: The atrium was mildly dilated. - Right atrium: Central venous pressure (est): 3 mm Hg. - Tricuspid valve: There was trivial regurgitation. - Pulmonary  arteries: Systolic pressure could not be accurately   estimated. - Pericardium, extracardiac: A prominent pericardial fat pad was   present.  ASSESSMENT AND PLAN:  1.  Chronic diastolic CHF: She has no complaints or lower extremity edema, abdominal distention, or significant weight gain. She is doing very well on current medication regimen of Lasix 20 mg daily, lisinopril 5 mg daily. Potassium replacement. I will repeat her echocardiogram for comparison of September study as she has undergone radiation and chemotherapy to evaluate for systolic function.  2. Hypertension: Blood pressure has excellent control currently. Lisinopril 5 mg daily will be continued. No other changes at this time.  3. Carcinoma of the upper outer quadrant of the left breast: She's being followed by Dr. Whitney Muse. Ongoing labs and evaluation continued through their service.    Current medicines are reviewed  at length with the patient today.    Labs/ tests ordered today include:   Orders Placed This Encounter  Procedures  . ECHOCARDIOGRAM COMPLETE     Disposition:   FU with Dr. Johnsie Cancel in 3 months  Signed, Jory Sims, NP  07/26/2016 5:05 PM    [facility]. [address] [place], [place] [address] Phone: [phone]; Fax: [phone]

## 2016-07-26 NOTE — Progress Notes (Signed)
Name: Taylor Williams    DOB: February 15, 1951  Age: 65 y.o.  MR#: [id number]       PCP:  [name], MD      Insurance: Payor: MEDICARE / Plan: MEDICARE PART A AND B / Product Type: *No Product type* /   CC:   No chief complaint on file.   VS Vitals:   07/26/16 1529  BP: 128/68  Pulse: 86  SpO2: 98%  Weight: 223 lb (101.2 kg)  Height: 5' 8"  (1.727 m)    Weights Current Weight  07/26/16 223 lb (101.2 kg)  07/25/16 227 lb 8 oz (103.2 kg)  05/31/16 226 lb 4.8 oz (102.6 kg)    Blood Pressure  BP Readings from Last 3 Encounters:  07/26/16 128/68  07/25/16 131/61  05/31/16 126/62     Admit date:  (Not on file) Last encounter with RMR:  Visit date not found   Allergy Aspirin; Other; and Fish allergy  Current Outpatient Prescriptions  Medication Sig Dispense Refill  . ALPRAZolam (XANAX) 0.25 MG tablet Take 0.25 mg by mouth 2 (two) times daily.    . cetirizine (ZYRTEC) 10 MG tablet Take 10 mg by mouth daily.    . citalopram (CELEXA) 20 MG tablet Take 20 mg by mouth daily.    . diphenoxylate-atropine (LOMOTIL) 2.5-0.025 MG tablet May take 1-2 tabs four times a day prn loose stools. 60 tablet 1  . furosemide (LASIX) 20 MG tablet Take 1 tablet (20 mg total) by mouth daily. 90 tablet 3  . glipiZIDE (GLUCOTROL) 10 MG tablet Take 10 mg by mouth 2 (two) times daily before a meal.    . Krill Oil 500 MG CAPS Take 500 mg by mouth. 1,500 mg BID    . lisinopril (PRINIVIL,ZESTRIL) 5 MG tablet Take 1 tablet (5 mg total) by mouth daily. 90 tablet 3  . loperamide (IMODIUM A-D) 2 MG tablet Take 4 mg by mouth 2 (two) times daily.    Marland Kitchen LORazepam (ATIVAN) 0.5 MG tablet Take 1 tablet (0.5 mg total) by mouth every 4 (four) hours as needed for anxiety. Or as needed for nausea 60 tablet 0  . metFORMIN (GLUCOPHAGE) 1000 MG tablet Take 1,000 mg by mouth 2 (two) times daily with a meal.    . ondansetron (ZOFRAN) 8 MG tablet Take 1 tablet (8 mg total) by mouth every 8 (eight) hours as needed for nausea or  vomiting. 30 tablet 2  . potassium chloride SA (K-DUR,KLOR-CON) 20 MEQ tablet Take 1 tablet (20 mEq total) by mouth daily. 90 tablet 3  . prochlorperazine (COMPAZINE) 10 MG tablet Take 1 tablet (10 mg total) by mouth every 6 (six) hours as needed for nausea or vomiting. 30 tablet 2  . promethazine (PHENERGAN) 25 MG tablet Take 1 tablet (25 mg total) by mouth every 6 (six) hours as needed for nausea or vomiting. 45 tablet 0  . sitaGLIPtin (JANUVIA) 100 MG tablet Take 100 mg by mouth daily.     No current facility-administered medications for this visit.     Discontinued Meds:   There are no discontinued medications.  Patient Active Problem List   Diagnosis Date Noted  . Solitary pulmonary nodule 07/25/2016  . High blood sugar 02/02/2016  . Diarrhea 01/19/2016  . Carcinoma of upper-outer quadrant of left female breast ([place]) 01/01/2016    LABS    Component Value Date/Time   NA 135 05/31/2016 1224   NA 133 (L) 05/20/2016 1243   NA 125 (L) 03/15/2016 6314  K 3.6 05/31/2016 1224   K 3.8 05/20/2016 1243   K 5.0 03/15/2016 0910   CL 102 05/31/2016 1224   CL 99 (L) 05/20/2016 1243   CL 97 (L) 03/15/2016 0910   CO2 27 05/31/2016 1224   CO2 24 05/20/2016 1243   CO2 19 (L) 03/15/2016 0910   GLUCOSE 168 (H) 05/31/2016 1224   GLUCOSE 285 (H) 05/20/2016 1243   GLUCOSE 399 (H) 03/15/2016 0910   BUN 5 (L) 05/31/2016 1224   BUN 10 05/20/2016 1243   BUN 8 03/15/2016 0910   CREATININE 0.62 05/31/2016 1224   CREATININE 0.99 05/20/2016 1243   CREATININE 0.67 03/15/2016 0910   CALCIUM 9.2 05/31/2016 1224   CALCIUM 9.7 05/20/2016 1243   CALCIUM 8.9 03/15/2016 0910   GFRNONAA >60 05/31/2016 1224   GFRNONAA 59 (L) 05/20/2016 1243   GFRNONAA >60 03/15/2016 0910   GFRAA >60 05/31/2016 1224   GFRAA >60 05/20/2016 1243   GFRAA >60 03/15/2016 0910   CMP     Component Value Date/Time   NA 135 05/31/2016 1224   K 3.6 05/31/2016 1224   CL 102 05/31/2016 1224   CO2 27 05/31/2016 1224    GLUCOSE 168 (H) 05/31/2016 1224   BUN 5 (L) 05/31/2016 1224   CREATININE 0.62 05/31/2016 1224   CALCIUM 9.2 05/31/2016 1224   PROT 7.1 05/31/2016 1224   ALBUMIN 3.2 (L) 05/31/2016 1224   AST 34 05/31/2016 1224   ALT 19 05/31/2016 1224   ALKPHOS 54 05/31/2016 1224   BILITOT 0.6 05/31/2016 1224   GFRNONAA >60 05/31/2016 1224   GFRAA >60 05/31/2016 1224       Component Value Date/Time   WBC 5.3 05/31/2016 1224   WBC 8.8 03/15/2016 0910   WBC 15.5 (H) 02/27/2016 1511   HGB 12.2 05/31/2016 1224   HGB 11.1 (L) 03/15/2016 0910   HGB 11.1 (L) 02/27/2016 1511   HCT 36.0 05/31/2016 1224   HCT 33.0 (L) 03/15/2016 0910   HCT 31.9 (L) 02/27/2016 1511   MCV 91.8 05/31/2016 1224   MCV 88.9 03/15/2016 0910   MCV 87.4 02/27/2016 1511    Lipid Panel  No results found for: CHOL, TRIG, HDL, CHOLHDL, VLDL, LDLCALC, LDLDIRECT  ABG No results found for: PHART, PCO2ART, PO2ART, HCO3, TCO2, ACIDBASEDEF, O2SAT   Lab Results  Component Value Date   TSH 1.014 05/20/2016   BNP (last 3 results)  Recent Labs  04/25/16 1122  BNP 153.0*    ProBNP (last 3 results) No results for input(s): PROBNP in the last 8760 hours.  Cardiac Panel (last 3 results) No results for input(s): CKTOTAL, CKMB, TROPONINI, RELINDX in the last 72 hours.  Iron/TIBC/Ferritin/ %Sat No results found for: IRON, TIBC, FERRITIN, IRONPCTSAT   EKG Orders placed or performed in visit on 05/20/16  . EKG 12-Lead     Prior Assessment and Plan Problem List as of 07/26/2016 Reviewed: 07/25/2016 11:04 AM by Forest Gleason, MD     Other   Carcinoma of upper-outer quadrant of left female breast [facility])   Last Assessment & Plan 03/15/2016 Office Visit Edited 03/15/2016  5:16 PM by Baird Cancer, PA-C    Stage IA ([id number]) left invasive breast cancer, triple negative, 0.8 cm in size, S/P left lumpectomy by Dr. Ladona Horns on 11/30/2015. TC adjuvant chemotherapy began on 01/12/2016.  Oncology history updated.  Pre-treatment labs: CBC  diff, CMET.  I personally reviewed and went over laboratory results with the patient.  The results are noted within this  dictation.  Labs meet treatment criteria.  K+ is WNL, so I will ask her to hold [place].  Hyperglycemia noted.  She notes noncompliance to her DM medications this AM.  8 units of insulin is ordered and administered with a repeat glucose in 1 hour.  Port was removed by Dr. Ladona Horns on Wednesday, 03/13/2016, due to concern of infection.  She subsequently underwent PICC line placement yesterday AM.  We will have PICC line removed today following treatment.  Order is placed for PET imaging in ~ 2 weeks to evaluate her concerning CT chest screening findings.  Smoking cessation strongly encouraged.  She is working on behavioral modification.  She has met with XRT and they are prepared to embark on XRT for breast cancer.  Return in 3 weeks for follow-up and review of PET scan imaging results.  Further medical oncology recommendations will follow.      Diarrhea   High blood sugar   Solitary pulmonary nodule       Imaging: Ct Chest Wo Contrast  Result Date: 07/16/2016 CLINICAL DATA:  Pulmonary nodules. EXAM: CT CHEST WITHOUT CONTRAST TECHNIQUE: Multidetector CT imaging of the chest was performed following the standard protocol without IV contrast. COMPARISON:  PET-CT 04/03/2016.  Chest CT 02/01/2016. FINDINGS: Cardiovascular: Heart is enlarged. No pericardial effusion. Coronary artery calcification is noted. Atherosclerotic calcification is noted in the wall of the thoracic aorta. Mediastinum/Nodes: Scattered mediastinal lymph nodes are normal to upper normal size and unchanged in the interval. Index prevascular lymph node measured 11 mm short axis on the prior study measures 9 mm today (image 51 series 2). No evidence for gross hilar lymphadenopathy although assessment is limited by the lack of intravenous contrast on today's study. The esophagus has normal imaging features. There is no  axillary lymphadenopathy. Lungs/Pleura: Previously characterized ill-defined perifissural nodule in the right lung (Image 67 series 3 today) is stable comparing back to prior CT of 02/01/2016. Given that lung cancer low-dose screening protocol not used for today's study, volume derived mean diameter comparison is not possible. Calcified granuloma is seen immediately inferior to this nodule. Right middle lobe granuloma is visible on image 82. No suspicious pulmonary nodule or mass is seen on the left. No evidence for pulmonary edema or pleural effusion. Upper Abdomen: Nodular liver contour suggests cirrhosis and vascular collateralization in the left upper quadrant is suggestive of portal venous hypertension. Musculoskeletal: Bone windows reveal no worrisome lytic or sclerotic osseous lesions. Skin thickening and edema/ inflammation through the left breast is similar to prior. IMPRESSION: 1. No change in the ill-defined spiculated posterior right upper lobe pulmonary nodule. Five month of stable imaging appearance is well of lack of demonstrable hypermetabolism on intervening PET-CT are both reassuring for benign process, potentially scarring. Given appearance, continued follow-up is recommended and repeat CT chest in 6 months could be used to ensure continued stability. 2. Stable mediastinal lymph nodes. 3. Scattered granulomata in the right lung. 4. Hepatic imaging appearance suggests cirrhosis with vascular collateralization in the upper left abdomen suspicious for portal venous hypertension. 5. Coronary artery and thoracic aortic atherosclerosis. Electronically Signed   By: Misty Stanley M.D.   On: 07/16/2016 15:43

## 2016-08-02 ENCOUNTER — Telehealth: Payer: Self-pay | Admitting: *Deleted

## 2016-08-02 ENCOUNTER — Ambulatory Visit (HOSPITAL_COMMUNITY)
Admission: RE | Admit: 2016-08-02 | Discharge: 2016-08-02 | Disposition: A | Discharge status: Home / Self Care | Payer: Medicare Other | Source: Ambulatory Visit | Attending: Adult Health | Admitting: Adult Health

## 2016-08-02 DIAGNOSIS — I358 Other nonrheumatic aortic valve disorders: Secondary | ICD-10-CM | POA: Diagnosis not present

## 2016-08-02 DIAGNOSIS — I509 Heart failure, unspecified: Secondary | ICD-10-CM | POA: Diagnosis not present

## 2016-08-02 DIAGNOSIS — I519 Heart disease, unspecified: Secondary | ICD-10-CM | POA: Insufficient documentation

## 2016-08-02 DIAGNOSIS — I5189 Other ill-defined heart diseases: Secondary | ICD-10-CM

## 2016-08-02 NOTE — Telephone Encounter (Signed)
Called patient with test results. No answer. Left message to call back.  

## 2016-08-02 NOTE — Progress Notes (Signed)
*  PRELIMINARY RESULTS* Echocardiogram 2D Echocardiogram has been performed.  Leavy Cella 08/02/2016, 12:13 PM

## 2016-08-02 NOTE — Telephone Encounter (Signed)
-----   Message from Lendon Colonel, NP sent at 08/02/2016  5:06 PM EST ----- Essentially normal echo. Mild left ventricular enlargement. Continue current regimen.

## 2016-10-21 ENCOUNTER — Ambulatory Visit: Payer: Medicare Other | Admitting: Adult Health

## 2016-10-22 DIAGNOSIS — E1165 Type 2 diabetes mellitus with hyperglycemia: Secondary | ICD-10-CM | POA: Diagnosis not present

## 2016-10-22 DIAGNOSIS — F3289 Other specified depressive episodes: Secondary | ICD-10-CM | POA: Diagnosis not present

## 2016-10-22 DIAGNOSIS — I10 Essential (primary) hypertension: Secondary | ICD-10-CM | POA: Diagnosis not present

## 2016-10-27 DIAGNOSIS — M7989 Other specified soft tissue disorders: Secondary | ICD-10-CM | POA: Diagnosis not present

## 2016-10-27 DIAGNOSIS — M79641 Pain in right hand: Secondary | ICD-10-CM | POA: Diagnosis not present

## 2016-10-27 NOTE — Progress Notes (Signed)
Cardiology Office Note   Date:  10/28/2016   ID:  Taylor Williams, DOB [birth year], MRN [id number]  PCP:  [name], MD  Cardiologist: Oswaldo Conroy, MD   No chief complaint on file.     History of Present Illness: Taylor Williams is a 66 y.o. female who presents for ongoing assessment and management of cardiovascular risk factors, grade 2 diastolic dysfunction  hypertension, hypercholesterolemia, other history includes breast cancer being followed by Dr. Whitney Muse, ongoing tobacco abuse and diabetes.  The patient was last seen by me  on 05/20/2016, and was found to have some lower extremity edema. He did not feel it was related to LV systolic function and doubt related to "diastolic abnormalities. She was continued on Lasix. She was finally hypotensive with dizziness and lisinopril HCTZ was stopped, she was only put on lisinopril 5 mg for renal protection. Follow-up TSH cortisol BMET and 24-hour urine for protein was ordered.  Labs dated 05/31/2016 sodium 135, potassium 3.6, chloride 102, CO2 27, BUN 5, creatinine 0.62. Hemoglobin 12.2, hematocrit 36.0, white blood cells 5.3, platelets 158. TSH 1.014. Cortisol 9.1  She is here today without any further complaints. Her chemotherapy and radiation was completed just before Thanksgiving 2017. Her energy is beginning to return. She denies any further complaints of lower extremity edema or dizziness.  Reviewed echo done 08/02/16 EF 55-60% moderate LVH no comment on diastole Moderate LAE   Has f/u with oncology next month but prognosis is good   Past Medical History:  Diagnosis Date  . Anxiety   . Depression   . High cholesterol   . Hypertension     Past Surgical History:  Procedure Laterality Date  . BREAST LUMPECTOMY Left    states for ductal carcinoma, had surgery in late may  . COLONOSCOPY    . galbladder    . GANGLION CYST EXCISION    . TONSILLECTOMY AND ADENOIDECTOMY       Current Outpatient Prescriptions  Medication Sig  Dispense Refill  . ALPRAZolam (XANAX) 0.25 MG tablet Take 0.25 mg by mouth 2 (two) times daily.    . cetirizine (ZYRTEC) 10 MG tablet Take 10 mg by mouth daily.    . citalopram (CELEXA) 20 MG tablet Take 20 mg by mouth daily.    . diphenoxylate-atropine (LOMOTIL) 2.5-0.025 MG tablet May take 1-2 tabs four times a day prn loose stools. 60 tablet 1  . furosemide (LASIX) 20 MG tablet Take 1 tablet (20 mg total) by mouth daily. 90 tablet 3  . glipiZIDE (GLUCOTROL) 10 MG tablet Take 10 mg by mouth 2 (two) times daily before a meal.    . Krill Oil 500 MG CAPS Take 500 mg by mouth. 1,500 mg BID    . lisinopril (PRINIVIL,ZESTRIL) 5 MG tablet Take 5 mg by mouth daily.    Marland Kitchen loperamide (IMODIUM A-D) 2 MG tablet Take 4 mg by mouth 2 (two) times daily.    Marland Kitchen LORazepam (ATIVAN) 0.5 MG tablet Take 1 tablet (0.5 mg total) by mouth every 4 (four) hours as needed for anxiety. Or as needed for nausea 60 tablet 0  . metFORMIN (GLUCOPHAGE) 1000 MG tablet Take 1,000 mg by mouth 2 (two) times daily with a meal.    . ondansetron (ZOFRAN) 8 MG tablet Take 1 tablet (8 mg total) by mouth every 8 (eight) hours as needed for nausea or vomiting. 30 tablet 2  . potassium chloride SA (K-DUR,KLOR-CON) 20 MEQ tablet Take 1 tablet (20 mEq total) by  mouth daily. 90 tablet 3  . prochlorperazine (COMPAZINE) 10 MG tablet Take 1 tablet (10 mg total) by mouth every 6 (six) hours as needed for nausea or vomiting. 30 tablet 2  . promethazine (PHENERGAN) 25 MG tablet Take 1 tablet (25 mg total) by mouth every 6 (six) hours as needed for nausea or vomiting. 45 tablet 0  . sitaGLIPtin (JANUVIA) 100 MG tablet Take 100 mg by mouth daily.    Marland Kitchen venlafaxine (EFFEXOR) 75 MG tablet Take 75 mg by mouth 2 (two) times daily.     No current facility-administered medications for this visit.     Allergies:   Aspirin; Other; and Fish allergy    Social History:  The patient  reports that she has been smoking.  She started smoking about 51 years ago.  She has a 20.00 pack-year smoking history. She has never used smokeless tobacco. She reports that she does not drink alcohol or use drugs.   Family History:  The patient's family history includes Cancer in her paternal grandfather and paternal grandmother; Cancer - Other in her mother; Dementia in her father; Diabetes in her father; Hypertension in her father; Hyperthyroidism in her brother.    ROS: All other systems are reviewed and negative. Unless otherwise mentioned in H&P    PHYSICAL EXAM: VS:  BP 104/62   Pulse 75   Ht 5' 8"  (1.727 m)   Wt 229 lb (103.9 kg)   SpO2 96%   BMI 34.82 kg/m  , BMI Body mass index is 34.82 kg/m. GEN: Well nourished, well developed, in no acute distress Obese. HEENT: normal  Neck: no JVD, carotid bruits, or masses Cardiac: RRR; no murmurs, rubs, or gallops,no edema  Respiratory:  clear to auscultation bilaterally, normal work of breathing GI: soft, nontender, nondistended, + BS MS: no deformity or atrophy  Skin: warm and dry, no rash Neuro:  Strength and sensation are intact Psych: euthymic mood, full affect   Recent Labs: 04/25/2016: B Natriuretic Peptide 153.0 05/20/2016: TSH 1.014 05/31/2016: ALT 19; BUN 5; Creatinine, Ser 0.62; Hemoglobin 12.2; Platelets 158; Potassium 3.6; Sodium 135     Wt Readings from Last 3 Encounters:  10/28/16 229 lb (103.9 kg)  07/26/16 223 lb (101.2 kg)  07/25/16 227 lb 8 oz (103.2 kg)      Other studies Reviewed: Echocardiogram: 04/25/2016 Left ventricle: The cavity size was normal. Wall thickness was   increased in a pattern of mild LVH. Systolic function was normal.   The estimated ejection fraction was in the range of 60% to 65%.   Wall motion was normal; there were no regional wall motion   abnormalities. Features are consistent with a pseudonormal left   ventricular filling pattern, with concomitant abnormal relaxation   and increased filling pressure (grade 2 diastolic dysfunction). - Aortic valve:  Mildly calcified annulus. Trileaflet. - Mitral valve: Calcified annulus. There was trivial regurgitation. - Left atrium: The atrium was mildly dilated. - Right atrium: Central venous pressure (est): 3 mm Hg. - Tricuspid valve: There was trivial regurgitation. - Pulmonary arteries: Systolic pressure could not be accurately   estimated. - Pericardium, extracardiac: A prominent pericardial fat pad was   present.  ASSESSMENT AND PLAN:  1.  Chronic diastolic CHF: She has no complaints or lower extremity edema, abdominal distention, or significant weight gain. She is doing very well on current medication regimen of Lasix 20 mg daily, lisinopril 5 mg daily. Potassium replacement. Echo done 08/02/16 normal  Knows to take extra lasix for  edema   2. Hypertension: Blood pressure has excellent control currently. Lisinopril 5 mg daily will be continued. No other changes at this time.  3. Carcinoma of the upper outer quadrant of the left breast: She's being followed by Dr. Whitney Muse. Ongoing labs and evaluation continued through their service.    Current medicines are reviewed at length with the patient today.    Labs/ tests ordered today include: None   No orders of the defined types were placed in this encounter.    Disposition:   FU with me in a year  Signed, Jenkins Rouge, MD  10/28/2016 9:52 AM    [facility] [address], [place], [place] [address] Phone: [phone]; Fax: [phone]

## 2016-10-28 ENCOUNTER — Ambulatory Visit (INDEPENDENT_AMBULATORY_CARE_PROVIDER_SITE_OTHER): Payer: Medicare Other | Admitting: Cardiovascular Disease

## 2016-10-28 ENCOUNTER — Encounter: Payer: Self-pay | Admitting: Cardiovascular Disease

## 2016-10-28 VITALS — BP 104/62 | HR 75 | Ht 68.0 in | Wt 229.0 lb

## 2016-10-28 DIAGNOSIS — R609 Edema, unspecified: Secondary | ICD-10-CM

## 2016-10-28 NOTE — Patient Instructions (Signed)
Your physician wants you to follow-up in: 1 Year with Dr. Johnsie Cancel. You will receive a reminder letter in the mail two months in advance. If you don't receive a letter, please call our office to schedule the follow-up appointment.  Your physician recommends that you continue on your current medications as directed. Please refer to the Current Medication list given to you today.  If you need a refill on your cardiac medications before your next appointment, please call your pharmacy.  Thank you for choosing [facility]!

## 2016-11-25 ENCOUNTER — Other Ambulatory Visit (HOSPITAL_COMMUNITY): Payer: Self-pay | Admitting: Oncology

## 2016-11-25 DIAGNOSIS — Z9889 Other specified postprocedural states: Secondary | ICD-10-CM

## 2016-11-26 ENCOUNTER — Encounter (HOSPITAL_COMMUNITY): Payer: Self-pay

## 2016-11-26 ENCOUNTER — Ambulatory Visit (HOSPITAL_COMMUNITY)
Admission: RE | Admit: 2016-11-26 | Discharge: 2016-11-26 | Disposition: A | Discharge status: Home / Self Care | Payer: Medicare Other | Source: Ambulatory Visit | Attending: Hematology & Oncology | Admitting: Hematology & Oncology

## 2016-11-26 ENCOUNTER — Other Ambulatory Visit (HOSPITAL_COMMUNITY): Payer: Self-pay | Admitting: *Deleted

## 2016-11-26 DIAGNOSIS — C50412 Malignant neoplasm of upper-outer quadrant of left female breast: Secondary | ICD-10-CM

## 2016-11-26 DIAGNOSIS — Z171 Estrogen receptor negative status [ER-]: Secondary | ICD-10-CM | POA: Insufficient documentation

## 2016-11-26 DIAGNOSIS — R911 Solitary pulmonary nodule: Secondary | ICD-10-CM | POA: Diagnosis not present

## 2016-11-26 DIAGNOSIS — C50912 Malignant neoplasm of unspecified site of left female breast: Secondary | ICD-10-CM | POA: Diagnosis not present

## 2016-11-26 HISTORY — DX: Malignant neoplasm of unspecified site of unspecified female breast: C50.919

## 2016-11-27 ENCOUNTER — Encounter (HOSPITAL_COMMUNITY): Payer: Medicare Other | Attending: Hematology & Oncology | Admitting: Oncology

## 2016-11-27 ENCOUNTER — Encounter (HOSPITAL_COMMUNITY): Payer: Self-pay

## 2016-11-27 ENCOUNTER — Encounter (HOSPITAL_COMMUNITY): Payer: Medicare Other | Attending: Oncology

## 2016-11-27 VITALS — BP 139/62 | HR 69 | Temp 97.8°F | Resp 20 | Wt 229.3 lb

## 2016-11-27 DIAGNOSIS — E119 Type 2 diabetes mellitus without complications: Secondary | ICD-10-CM

## 2016-11-27 DIAGNOSIS — R911 Solitary pulmonary nodule: Secondary | ICD-10-CM | POA: Diagnosis not present

## 2016-11-27 DIAGNOSIS — Z853 Personal history of malignant neoplasm of breast: Secondary | ICD-10-CM | POA: Diagnosis not present

## 2016-11-27 DIAGNOSIS — C50412 Malignant neoplasm of upper-outer quadrant of left female breast: Secondary | ICD-10-CM | POA: Diagnosis not present

## 2016-11-27 LAB — CBC WITH DIFFERENTIAL/PLATELET
BASOS ABS: 0 10*3/uL (ref 0.0–0.1)
Basophils Relative: 0 %
EOS ABS: 0.1 10*3/uL (ref 0.0–0.7)
EOS PCT: 3 %
HCT: 40.1 % (ref 36.0–46.0)
Hemoglobin: 13.7 g/dL (ref 12.0–15.0)
LYMPHS ABS: 0.8 10*3/uL (ref 0.7–4.0)
Lymphocytes Relative: 17 %
MCH: 30 pg (ref 26.0–34.0)
MCHC: 34.2 g/dL (ref 30.0–36.0)
MCV: 87.7 fL (ref 78.0–100.0)
MONO ABS: 0.3 10*3/uL (ref 0.1–1.0)
Monocytes Relative: 7 %
Neutro Abs: 3.6 10*3/uL (ref 1.7–7.7)
Neutrophils Relative %: 73 %
PLATELETS: 147 10*3/uL — AB (ref 150–400)
RBC: 4.57 MIL/uL (ref 3.87–5.11)
RDW: 13.6 % (ref 11.5–15.5)
WBC: 4.9 10*3/uL (ref 4.0–10.5)

## 2016-11-27 LAB — COMPREHENSIVE METABOLIC PANEL
ALBUMIN: 2.8 g/dL — AB (ref 3.5–5.0)
ALT: 27 U/L (ref 14–54)
AST: 47 U/L — AB (ref 15–41)
Alkaline Phosphatase: 109 U/L (ref 38–126)
Anion gap: 7 (ref 5–15)
BUN: 7 mg/dL (ref 6–20)
CHLORIDE: 95 mmol/L — AB (ref 101–111)
CO2: 25 mmol/L (ref 22–32)
CREATININE: 0.68 mg/dL (ref 0.44–1.00)
Calcium: 8.9 mg/dL (ref 8.9–10.3)
GFR calc Af Amer: >60 mL/min (ref >60)
GFR calc non Af Amer: >60 mL/min (ref >60)
Glucose, Bld: 507 mg/dL (ref 65–99)
Potassium: 3.9 mmol/L (ref 3.5–5.1)
SODIUM: 127 mmol/L — AB (ref 135–145)
Total Bilirubin: 0.6 mg/dL (ref 0.3–1.2)
Total Protein: 6.6 g/dL (ref 6.5–8.1)

## 2016-11-27 NOTE — Patient Instructions (Addendum)
Taylor Williams at [facility] Discharge Instructions  RECOMMENDATIONS MADE BY THE CONSULTANT AND ANY TEST RESULTS WILL BE SENT TO YOUR REFERRING PHYSICIAN.  You were seen today by Dr. Twana First We will schedule CT scan in 2 months Follow up in 6 months    Thank you for choosing [place] at [facility] to provide your oncology and hematology care.  To afford each patient quality time with our provider, please arrive at least 15 minutes before your scheduled appointment time.    If you have a lab appointment with the [facility] please come in thru the  Main Entrance and check in at the main information desk  You need to re-schedule your appointment should you arrive 10 or more minutes late.  We strive to give you quality time with our providers, and arriving late affects you and other patients whose appointments are after yours.  Also, if you no show three or more times for appointments you may be dismissed from the clinic at the providers discretion.     Again, thank you for choosing [facility].  Our hope is that these requests will decrease the amount of time that you wait before being seen by our physicians.       _____________________________________________________________  Should you have questions after your visit to [facility], please contact our office at [phone] between the hours of 8:30 a.m. and 4:30 p.m.  Voicemails left after 4:30 p.m. will not be returned until the following business day.  For prescription refill requests, have your pharmacy contact our office.       Resources For Cancer Patients and their Caregivers ? American Cancer Society: Can assist with transportation, wigs, general needs, runs Look Good Feel Better.        504-306-0659 ? Cancer Care: Provides financial assistance, online support groups, medication/co-pay assistance.  1-800-813-HOPE 6176708801) ? [facility] Assists [place] cancer patients and their families through emotional , educational and financial support.  708-285-8480 ? [place] DSS Where to apply for food stamps, Medicaid and utility assistance. 445-436-3270 ? [facility]: Transportation to medical appointments. 9175131201 ? Social Security Administration: May apply for disability if have a Stage IV cancer. (314)163-5518 406-674-9459 ? [employer], Disability and Transit Services: Assists with nutrition, care and transit needs. [place] Support Programs: @10RELATIVEDAYS @ > Cancer Support Group  2nd Tuesday of the month 1pm-2pm, Journey Room  > Creative Journey  3rd Tuesday of the month 1130am-1pm, Journey Room  > Look Good Feel Better  1st Wednesday of the month 10am-12 noon, Journey Room (Call [place] to register [phone])

## 2016-11-27 NOTE — Progress Notes (Signed)
CRITICAL VALUE ALERT Critical value received:  Glucose- 507 Date of notification:  11/27/16 Time of notification: 1499 Critical value read back:  Yes.   Nurse who received alert:  M.Karizma Cheek, LPN MD notified (1st page):  Twana First, MD

## 2016-11-27 NOTE — Progress Notes (Signed)
[name]  Progress Note  Patient Care Team: [name], MD as PCP - General (Internal Medicine)  CHIEF COMPLAINTS:    Carcinoma of upper-outer quadrant of left female breast ([place])   11/30/2015 Surgery    Lumpectomy and sentinel node procedure with Dr. Ladona Horns      11/30/2015 Surgery    Needle localization of the L breast with mammo guidance      11/30/2015 Pathology Results    L breast invasive ductal carcinoma, mod diff, 0.8 cm, focal DCIS, high grade, 2/2 LN negative, no LVI, pT1b, pN0M0, ER- PR- HER2 - Ki-67 92%      01/12/2016 - 03/15/2016 Chemotherapy    TC x 4 cycles      02/01/2016 Imaging    CT chest screening- Lung-RADS Category 4B, suspicious. Additional imaging evaluation or consultation with pulmonary medicine or thoracic surgery recommended. Bilateral pulmonary nodules, including an inferior right upper lobe spiculated 11 mm nodule.       03/13/2016 Procedure    Port removed by Dr. Ladona Horns due to concerns for infection.      03/14/2016 Procedure    PICC line placed      04/03/2016 PET scan    Majority of visualized pulmonary nodules are too small for PET resolution. Largest nodule in the right upper lobe is likely stable and does not show metabolism above blood pool. Follow-up CT chest without contrast in 3 months is recommended in further evaluation, to document stability, as malignancy cannot be excluded.Hypermetabolic subcutaneous nodule along the medial right shoulder. This the should be amenable to direct Visualization/palpation. Mild hypermetabolism associated with the left breast and left axilla, likely postoperative in etiology. Please correlate clinically. Aortic atherosclerosis and coronary artery calcification.Cirrhosis.       HISTORY OF PRESENTING ILLNESS:  Taylor Williams 66 y.o. female is here for follow-up of stage I triple negative carcinoma of the L breast. She has completed 4 cycles of TC.   Taylor Williams is here today unaccompanied. She  feels great today. She is very excited about her recent bilateral diagnostic mammogram on 11/26/16 which was negative for malignancy. Patient has been having trouble with getting some of her medications including her [facility]. Her glucose was 507 today on her labs. She will be stopping by her PCP's office later today to get a temporary script for her meds to be filled at her local pharmacy instead. She complains of weak knees bilaterally, but denies knee pain. Otherwise she has no new complaints.  MEDICAL HISTORY:  Past Medical History:  Diagnosis Date  . Anxiety   . Breast cancer ([place])   . Depression   . High cholesterol   . Hypertension     SURGICAL HISTORY: Past Surgical History:  Procedure Laterality Date  . BREAST LUMPECTOMY Left    states for ductal carcinoma, had surgery in late may  . COLONOSCOPY    . galbladder    . GANGLION CYST EXCISION    . TONSILLECTOMY AND ADENOIDECTOMY      SOCIAL HISTORY: Social History   Social History  . Marital status: Widowed    Spouse name: N/A  . Number of children: N/A  . Years of education: N/A   Occupational History  . Not on file.   Social History Main Topics  . Smoking status: Current Every Day Smoker    Packs/day: 0.50    Years: 40.00    Start date: 10/28/1965  . Smokeless tobacco: Never Used  .  Alcohol use No  . Drug use: No  . Sexual activity: Not on file   Other Topics Concern  . Not on file   Social History Narrative  . No narrative on file  Widowed for 40 years. Her husband was killed in an MVA. He was in the army and they were living in [place] at the time. No children. She never remarried. She has a close family, a close great nephew. Born in [place]. 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  Smoker cigarettes 1ppd for greater than 40 years.  No ETOH. Retired. Enjoys Haematologist, Barrister's clerk, volunteers at [employer].   FAMILY HISTORY: Family History    Problem Relation Age of Onset  . Cancer - Other Mother   . Hypertension Father   . Diabetes Father   . Dementia Father   . Hyperthyroidism Brother   . Cancer Paternal Grandmother   . Cancer Paternal Grandfather     Mother deceased at 65 from colon cancer Father deceased at 82 from complications of COPD, dementia One Brother healthy Maternal brother deceased from esophageal cancer Paternal grandmother had colon cancer and grandfather had lung cancer  ALLERGIES:  is allergic to aspirin; other; and fish allergy.  MEDICATIONS:  Current Outpatient Prescriptions  Medication Sig Dispense Refill  . ALPRAZolam (XANAX) 0.25 MG tablet Take 0.25 mg by mouth 2 (two) times daily.    . cetirizine (ZYRTEC) 10 MG tablet Take 10 mg by mouth daily.    . citalopram (CELEXA) 20 MG tablet Take 20 mg by mouth daily.    . diphenoxylate-atropine (LOMOTIL) 2.5-0.025 MG tablet May take 1-2 tabs four times a day prn loose stools. 60 tablet 1  . furosemide (LASIX) 20 MG tablet Take 1 tablet (20 mg total) by mouth daily. 90 tablet 3  . glipiZIDE (GLUCOTROL) 10 MG tablet Take 10 mg by mouth 2 (two) times daily before a meal.    . Krill Oil 500 MG CAPS Take 500 mg by mouth. 1,500 mg BID    . lisinopril (PRINIVIL,ZESTRIL) 5 MG tablet Take 5 mg by mouth daily.    Marland Kitchen loperamide (IMODIUM A-D) 2 MG tablet Take 4 mg by mouth 2 (two) times daily.    Marland Kitchen LORazepam (ATIVAN) 0.5 MG tablet Take 1 tablet (0.5 mg total) by mouth every 4 (four) hours as needed for anxiety. Or as needed for nausea 60 tablet 0  . metFORMIN (GLUCOPHAGE) 1000 MG tablet Take 1,000 mg by mouth 2 (two) times daily with a meal.    . ondansetron (ZOFRAN) 8 MG tablet Take 1 tablet (8 mg total) by mouth every 8 (eight) hours as needed for nausea or vomiting. 30 tablet 2  . potassium chloride SA (K-DUR,KLOR-CON) 20 MEQ tablet Take 1 tablet (20 mEq total) by mouth daily. 90 tablet 3  . prochlorperazine (COMPAZINE) 10 MG tablet Take 1 tablet (10 mg total) by  mouth every 6 (six) hours as needed for nausea or vomiting. 30 tablet 2  . promethazine (PHENERGAN) 25 MG tablet Take 1 tablet (25 mg total) by mouth every 6 (six) hours as needed for nausea or vomiting. 45 tablet 0  . sitaGLIPtin (JANUVIA) 100 MG tablet Take 100 mg by mouth daily.    Marland Kitchen venlafaxine (EFFEXOR) 75 MG tablet Take 75 mg by mouth 2 (two) times daily.     No current facility-administered medications for this visit.    Review of Systems  Constitutional:  Denies pain.  HENT: Negative.   Eyes: Negative.   Respiratory: Negative.  Negative for shortness of breath.   Cardiovascular: Negative.  Negative for chest pain and leg swelling (Previous edema, resolved now.).  Gastrointestinal: Negative.  Negative for abdominal pain, blood in stool, constipation and diarrhea.  Genitourinary: Negative.  Negative for hematuria.  Musculoskeletal: Positive for joint pain ("weak knees" pt attributed to age.).  Skin: Negative.   Neurological: Negative.   Endo/Heme/Allergies: Negative.   Psychiatric/Behavioral:       Lack of motivation due to slight depression  All other systems reviewed and are negative.  14 point ROS was done and is otherwise as detailed above or in HPI  PHYSICAL EXAMINATION:  Vitals:   11/27/16 1119  BP: 139/62  Pulse: 69  Resp: 20  Temp: 97.8 F (36.6 C)    Physical Exam  Constitutional: She is oriented to person, place, and time and well-developed, well-nourished, and in no distress. No distress.  HENT:  Head: Normocephalic and atraumatic.  Nose: Nose normal.  Mouth/Throat: Oropharynx is clear and moist. No oropharyngeal exudate.  Eyes: Conjunctivae and EOM are normal. Pupils are equal, round, and reactive to light. No scleral icterus.  Neck: Normal range of motion. Neck supple. No JVD present.  Cardiovascular: Normal rate, regular rhythm and normal heart sounds.  Exam reveals no gallop and no friction rub.   No murmur heard. Pulmonary/Chest: Effort normal  and breath sounds normal. No respiratory distress. She has no wheezes. She has no rales.  Abdominal: Soft. Bowel sounds are normal. She exhibits no distension and no mass. There is no tenderness. There is no rebound and no guarding.  Musculoskeletal: Normal range of motion. She exhibits no edema or tenderness.  Lymphadenopathy:    She has no cervical adenopathy.  Neurological: She is alert and oriented to person, place, and time. She has normal reflexes. No cranial nerve deficit. Gait normal. Coordination normal.  Skin: Skin is warm and dry. No rash noted. No erythema. No pallor.  Psychiatric: Mood, memory, affect and judgment normal.  Nursing note and vitals reviewed. Breast exam deferred today, 11/27/16, due to recent mammogram.  LABORATORY DATA:  I have reviewed the data as listed  Results for IMBERLY, TROXLER (MRN [id number]) as of 11/27/2016 11:37  Ref. Range 11/27/2016 10:57  COMPREHENSIVE METABOLIC PANEL Unknown Rpt (A)  Sodium Latest Ref Range: 135 - 145 mmol/L 127 (L)  Potassium Latest Ref Range: 3.5 - 5.1 mmol/L 3.9  Chloride Latest Ref Range: 101 - 111 mmol/L 95 (L)  CO2 Latest Ref Range: 22 - 32 mmol/L 25  Glucose Latest Ref Range: 65 - 99 mg/dL 507 (HH)  BUN Latest Ref Range: 6 - 20 mg/dL 7  Creatinine Latest Ref Range: 0.44 - 1.00 mg/dL 0.68  Calcium Latest Ref Range: 8.9 - 10.3 mg/dL 8.9  Anion gap Latest Ref Range: 5 - 15  7  Alkaline Phosphatase Latest Ref Range: 38 - 126 U/L 109  Albumin Latest Ref Range: 3.5 - 5.0 g/dL 2.8 (L)  AST Latest Ref Range: 15 - 41 U/L 47 (H)  ALT Latest Ref Range: 14 - 54 U/L 27  Total Protein Latest Ref Range: 6.5 - 8.1 g/dL 6.6  Total Bilirubin Latest Ref Range: 0.3 - 1.2 mg/dL 0.6  EGFR (African American) Latest Ref Range: >60 mL/min >60  EGFR (Non-African Amer.) Latest Ref Range: >60 mL/min >60  WBC Latest Ref Range: 4.0 - 10.5 K/uL 4.9  RBC Latest Ref Range: 3.87 - 5.11 MIL/uL 4.57  Hemoglobin Latest Ref Range: 12.0 - 15.0 g/dL 13.7  HCT  Latest Ref Range: 36.0 - 46.0 % 40.1  MCV Latest Ref Range: 78.0 - 100.0 fL 87.7  MCH Latest Ref Range: 26.0 - 34.0 pg 30.0  MCHC Latest Ref Range: 30.0 - 36.0 g/dL 34.2  RDW Latest Ref Range: 11.5 - 15.5 % 13.6  Platelets Latest Ref Range: 150 - 400 K/uL 147 (L)  Neutrophils Latest Units: % 73  Lymphocytes Latest Units: % 17  Monocytes Relative Latest Units: % 7  Eosinophil Latest Units: % 3  Basophil Latest Units: % 0  NEUT# Latest Ref Range: 1.7 - 7.7 K/uL 3.6  Lymphocyte # Latest Ref Range: 0.7 - 4.0 K/uL 0.8  Monocyte # Latest Ref Range: 0.1 - 1.0 K/uL 0.3  Eosinophils Absolute Latest Ref Range: 0.0 - 0.7 K/uL 0.1  Basophils Absolute Latest Ref Range: 0.0 - 0.1 K/uL 0.0    RADIOGRAPHIC STUDIES: I have personally reviewed the radiological images as listed and agreed with the findings in the report. Mm Diag Breast Tomo Bilateral  Result Date: 11/26/2016 CLINICAL DATA:  Due for annual examination. The patient underwent lumpectomy and radiation therapy in May of 2017 for left breast cancer. EXAM: 2D DIGITAL DIAGNOSTIC BILATERAL MAMMOGRAM WITH CAD AND ADJUNCT TOMO COMPARISON:  Previous exam(s). ACR Breast Density Category c: The breast tissue is heterogeneously dense, which may obscure small masses. FINDINGS: Lumpectomy scar is noted in the far lateral left breast. Skin thickening of left breast is compatible with interval radiation therapy. No mass, nonsurgical distortion, or suspicious microcalcification is identified in either breast to suggest malignancy. Mammographic images were processed with CAD. IMPRESSION: Lumpectomy radiation changes of the left breast. No evidence of malignancy bilaterally. RECOMMENDATION: Diagnostic mammogram is suggested in 1 year. (Code:[id number]-B-01Y) I have discussed the findings and recommendations with the patient. Results were also provided in writing at the conclusion of the visit. If applicable, a reminder letter will be sent to the patient regarding the next  appointment. BI-RADS CATEGORY  2: Benign. Electronically Signed   By: Curlene Dolphin M.D.   On: 11/26/2016 08:55     CLINICAL DATA:  Initial treatment strategy for pulmonary nodules.  EXAM: NUCLEAR MEDICINE PET SKULL BASE TO THIGH  TECHNIQUE: 10.7 mCi F-18 FDG was injected intravenously. Full-ring PET imaging was performed from the skull base to thigh after the radiotracer. CT data was obtained and used for attenuation correction and anatomic localization.  FASTING BLOOD GLUCOSE:  Value: 116 mg/dl  COMPARISON:  CT chest 02/01/2016.  FINDINGS: NECK  No hypermetabolic lymph nodes in the neck. CT images show no acute findings.  CHEST  An irregular nodule in the posterior segment right upper lobe (series 8, image 29), is poorly measured on the current exam due to respiratory motion. It appears grossly similar from 02/01/2016 and has an SUV max of 1.5. Other pulmonary nodules are too small for PET resolution. No hypermetabolic mediastinal, hilar or axillary lymph nodes. A subcutaneous nodule along the medial right shoulder measures 1.4 x 2.1 cm (CT image 34) with an SUV max of 3.8. There is mild hypermetabolism associated with the left breast and left axilla, likely postoperative in etiology. Aortic atherosclerosis and coronary artery calcification. No pericardial or pleural effusion.  ABDOMEN/PELVIS  No abnormal hypermetabolism in the liver, adrenal glands, spleen or pancreas. No hypermetabolic lymph nodes. Liver margin is irregular. Cholecystectomy. Nodular thickening of the right adrenal gland. Left adrenal glands, kidneys, spleen, pancreas, stomach and bowel are grossly unremarkable. Varices  are seen in the abdomen. No free fluid.  SKELETON  No abnormal osseous hypermetabolism.  IMPRESSION: 1. Majority of visualized pulmonary nodules are too small for PET resolution. Largest nodule in the right upper lobe is likely stable and does not show metabolism  above blood pool. Follow-up CT chest without contrast in 3 months is recommended in further evaluation, to document stability, as malignancy cannot be excluded. 2. Hypermetabolic subcutaneous nodule along the medial right shoulder. This the should be amenable to direct visualization/palpation. 3. Mild hypermetabolism associated with the left breast and left axilla, likely postoperative in etiology. Please correlate clinically. 4. Aortic atherosclerosis and coronary artery calcification. 5. Cirrhosis.   Electronically Signed   By: Lorin Picket M.D.   On: 04/03/2016 13:51  ASSESSMENT & PLAN:  1. Stage I ER- PR- HER 2 neu - carcinoma of the L breast Lumpectomy and sentinel LN biopsy with Dr. Ladona Horns on 11/30/2015 s/p 4 cycles of TC chemotherapy - Clinically NED. Continue observation - Bilateral diagnostic mammogram was negative. Repeat in one year. - RTC in 6 months for follow up and breast exam.  2. RUL pulmonary nodule - I have ordered a CT lung cancer screening protocol for 2 months from now since that will be the 6 month mark from her last scan.  3. Hyperglycemia in setting of IDDM Type II - f/u with PCP to get her medications today.   - RTC in 6 months for follow up.   Orders Placed This Encounter  Procedures  . CT CHEST LUNG CA SCREEN LOW DOSE W/O CM    Standing Status:   Future    Standing Expiration Date:   01/27/2018    Order Specific Question:   Reason for Exam (SYMPTOM  OR DIAGNOSIS REQUIRED)    Answer:   follow up for pulm nodules    Order Specific Question:   Preferred Imaging Location?    Answer:   [facility]   No orders of the defined types were placed in this encounter.   All questions were answered. The patient knows to call the clinic with any problems, questions or concerns.  This note was electronically signed.   This document serves as a record of services personally performed by Twana First, MD. It was created on her behalf by Maryla Morrow, a trained medical scribe. The creation of this record is based on the scribe's personal observations and the provider's statements to them. This document has been checked and approved by the attending provider.  I have reviewed the above documentation for accuracy and completeness and I agree with the above.  Twana First, MD 11/27/2016 11:43 AM

## 2016-12-03 ENCOUNTER — Encounter (HOSPITAL_COMMUNITY): Payer: Medicare Other

## 2017-01-21 DIAGNOSIS — F3289 Other specified depressive episodes: Secondary | ICD-10-CM | POA: Diagnosis not present

## 2017-01-21 DIAGNOSIS — Z1389 Encounter for screening for other disorder: Secondary | ICD-10-CM | POA: Diagnosis not present

## 2017-01-21 DIAGNOSIS — E1165 Type 2 diabetes mellitus with hyperglycemia: Secondary | ICD-10-CM | POA: Diagnosis not present

## 2017-01-21 DIAGNOSIS — Z0000 Encounter for general adult medical examination without abnormal findings: Secondary | ICD-10-CM | POA: Diagnosis not present

## 2017-01-21 DIAGNOSIS — I10 Essential (primary) hypertension: Secondary | ICD-10-CM | POA: Diagnosis not present

## 2017-01-23 ENCOUNTER — Ambulatory Visit (HOSPITAL_COMMUNITY): Payer: Medicare Other

## 2017-01-27 ENCOUNTER — Ambulatory Visit (HOSPITAL_COMMUNITY): Payer: Medicare Other

## 2017-02-25 ENCOUNTER — Ambulatory Visit (HOSPITAL_COMMUNITY)
Admission: RE | Admit: 2017-02-25 | Discharge: 2017-02-25 | Disposition: A | Discharge status: Home / Self Care | Payer: Medicare Other | Source: Ambulatory Visit | Attending: Hematology & Oncology | Admitting: Hematology & Oncology

## 2017-02-25 DIAGNOSIS — C50412 Malignant neoplasm of upper-outer quadrant of left female breast: Secondary | ICD-10-CM | POA: Insufficient documentation

## 2017-02-25 DIAGNOSIS — I700 Atherosclerosis of aorta: Secondary | ICD-10-CM | POA: Insufficient documentation

## 2017-02-25 DIAGNOSIS — Z171 Estrogen receptor negative status [ER-]: Secondary | ICD-10-CM | POA: Insufficient documentation

## 2017-02-25 DIAGNOSIS — R911 Solitary pulmonary nodule: Secondary | ICD-10-CM | POA: Insufficient documentation

## 2017-02-25 DIAGNOSIS — R918 Other nonspecific abnormal finding of lung field: Secondary | ICD-10-CM | POA: Diagnosis not present

## 2017-04-10 DIAGNOSIS — Z886 Allergy status to analgesic agent status: Secondary | ICD-10-CM | POA: Diagnosis not present

## 2017-04-10 DIAGNOSIS — I10 Essential (primary) hypertension: Secondary | ICD-10-CM | POA: Diagnosis not present

## 2017-04-10 DIAGNOSIS — D126 Benign neoplasm of colon, unspecified: Secondary | ICD-10-CM | POA: Diagnosis not present

## 2017-04-10 DIAGNOSIS — Z1211 Encounter for screening for malignant neoplasm of colon: Secondary | ICD-10-CM | POA: Diagnosis not present

## 2017-04-10 DIAGNOSIS — E119 Type 2 diabetes mellitus without complications: Secondary | ICD-10-CM | POA: Diagnosis not present

## 2017-04-10 DIAGNOSIS — K644 Residual hemorrhoidal skin tags: Secondary | ICD-10-CM | POA: Diagnosis not present

## 2017-04-10 DIAGNOSIS — Z884 Allergy status to anesthetic agent status: Secondary | ICD-10-CM | POA: Diagnosis not present

## 2017-04-10 DIAGNOSIS — Z853 Personal history of malignant neoplasm of breast: Secondary | ICD-10-CM | POA: Diagnosis not present

## 2017-04-24 DIAGNOSIS — E1165 Type 2 diabetes mellitus with hyperglycemia: Secondary | ICD-10-CM | POA: Diagnosis not present

## 2017-04-24 DIAGNOSIS — I10 Essential (primary) hypertension: Secondary | ICD-10-CM | POA: Diagnosis not present

## 2017-04-24 DIAGNOSIS — F3289 Other specified depressive episodes: Secondary | ICD-10-CM | POA: Diagnosis not present

## 2017-04-24 DIAGNOSIS — F411 Generalized anxiety disorder: Secondary | ICD-10-CM | POA: Diagnosis not present

## 2017-05-05 DIAGNOSIS — D126 Benign neoplasm of colon, unspecified: Secondary | ICD-10-CM | POA: Diagnosis not present

## 2017-05-05 DIAGNOSIS — D0512 Intraductal carcinoma in situ of left breast: Secondary | ICD-10-CM | POA: Diagnosis not present

## 2017-05-29 ENCOUNTER — Encounter (HOSPITAL_COMMUNITY): Payer: Self-pay | Admitting: Oncology

## 2017-05-29 ENCOUNTER — Other Ambulatory Visit (HOSPITAL_COMMUNITY): Payer: Self-pay | Admitting: Oncology

## 2017-05-29 ENCOUNTER — Encounter (HOSPITAL_COMMUNITY): Payer: Medicare Other | Attending: Oncology | Admitting: Oncology

## 2017-05-29 VITALS — BP 85/55 | HR 79 | Temp 97.8°F | Resp 16 | Ht 68.0 in | Wt 216.0 lb

## 2017-05-29 DIAGNOSIS — R918 Other nonspecific abnormal finding of lung field: Secondary | ICD-10-CM

## 2017-05-29 DIAGNOSIS — Z853 Personal history of malignant neoplasm of breast: Secondary | ICD-10-CM

## 2017-05-29 DIAGNOSIS — Z9221 Personal history of antineoplastic chemotherapy: Secondary | ICD-10-CM | POA: Diagnosis not present

## 2017-05-29 DIAGNOSIS — Z171 Estrogen receptor negative status [ER-]: Secondary | ICD-10-CM | POA: Diagnosis not present

## 2017-05-29 DIAGNOSIS — C50412 Malignant neoplasm of upper-outer quadrant of left female breast: Secondary | ICD-10-CM

## 2017-05-29 NOTE — Progress Notes (Signed)
[place]  Progress Note  Patient Care Team: [name], MD as PCP - General (Internal Medicine)  CHIEF COMPLAINTS:    Carcinoma of upper-outer quadrant of left female breast ([place])   11/30/2015 Surgery    Lumpectomy and sentinel node procedure with Dr. Ladona Horns      11/30/2015 Surgery    Needle localization of the L breast with mammo guidance      11/30/2015 Pathology Results    L breast invasive ductal carcinoma, mod diff, 0.8 cm, focal DCIS, high grade, 2/2 LN negative, no LVI, pT1b, pN0M0, ER- PR- HER2 - Ki-67 92%      01/12/2016 - 03/15/2016 Chemotherapy    TC x 4 cycles      02/01/2016 Imaging    CT chest screening- Lung-RADS Category 4B, suspicious. Additional imaging evaluation or consultation with pulmonary medicine or thoracic surgery recommended. Bilateral pulmonary nodules, including an inferior right upper lobe spiculated 11 mm nodule.       03/13/2016 Procedure    Port removed by Dr. Ladona Horns due to concerns for infection.      03/14/2016 Procedure    PICC line placed      04/03/2016 PET scan    Majority of visualized pulmonary nodules are too small for PET resolution. Largest nodule in the right upper lobe is likely stable and does not show metabolism above blood pool. Follow-up CT chest without contrast in 3 months is recommended in further evaluation, to document stability, as malignancy cannot be excluded.Hypermetabolic subcutaneous nodule along the medial right shoulder. This the should be amenable to direct Visualization/palpation. Mild hypermetabolism associated with the left breast and left axilla, likely postoperative in etiology. Please correlate clinically. Aortic atherosclerosis and coronary artery calcification.Cirrhosis.       HISTORY OF PRESENTING ILLNESS:  Taylor Williams 66 y.o. female is here for follow-up of stage I triple negative carcinoma of the L breast. She has completed 4 cycles of TC.   Patient presents today for continued  follow-up.  She underwent colonoscopy in October 2018 and was found to have 3 colonic polyps which were removed and all negative for malignancy.  Overall she has been doing well.  She has not palpated any new masses on her breasts.  Her appetite is at 100%.  Her energy level is at 75% is feels occasional fatigue.  Otherwise she denies any chest pain, shortness breath, abdominal pain, urinary problems, headaches, fevers, chills or recent infections.  MEDICAL HISTORY:  Past Medical History:  Diagnosis Date  . Anxiety   . Breast cancer ([place])   . Depression   . High cholesterol   . Hypertension     SURGICAL HISTORY: Past Surgical History:  Procedure Laterality Date  . BREAST LUMPECTOMY Left    states for ductal carcinoma, had surgery in late may  . COLONOSCOPY    . galbladder    . GANGLION CYST EXCISION    . TONSILLECTOMY AND ADENOIDECTOMY      SOCIAL HISTORY: Social History   Social History  . Marital status: Widowed    Spouse name: N/A  . Number of children: N/A  . Years of education: N/A   Occupational History  . Not on file.   Social History Main Topics  . Smoking status: Current Every Day Smoker    Packs/day: 0.50    Years: 40.00    Start date: 10/28/1965  . Smokeless tobacco: Never Used  . Alcohol use No  . Drug use: No  . Sexual  activity: Not on file   Other Topics Concern  . Not on file   Social History Narrative  . No narrative on file  Widowed for 40 years. Her husband was killed in an MVA. He was in the army and they were living in [place] at the time. No children. She never remarried. She has a close family, a close great nephew. Born in [place]. 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  Smoker cigarettes 1ppd for greater than 40 years.  No ETOH. Retired. Enjoys Haematologist, Barrister's clerk, volunteers at [employer].   FAMILY HISTORY: Family History  Problem Relation Age of Onset  . Cancer - Other Mother   .  Hypertension Father   . Diabetes Father   . Dementia Father   . Hyperthyroidism Brother   . Cancer Paternal Grandmother   . Cancer Paternal Grandfather     Mother deceased at 60 from colon cancer Father deceased at 30 from complications of COPD, dementia One Brother healthy Maternal brother deceased from esophageal cancer Paternal grandmother had colon cancer and grandfather had lung cancer  ALLERGIES:  is allergic to aspirin; other; and fish allergy.  MEDICATIONS:  Current Outpatient Prescriptions  Medication Sig Dispense Refill  . ALPRAZolam (XANAX) 0.25 MG tablet Take 0.25 mg by mouth 2 (two) times daily.    . cetirizine (ZYRTEC) 10 MG tablet Take 10 mg by mouth daily.    . citalopram (CELEXA) 20 MG tablet Take 20 mg by mouth daily.    . diphenoxylate-atropine (LOMOTIL) 2.5-0.025 MG tablet May take 1-2 tabs four times a day prn loose stools. 60 tablet 1  . furosemide (LASIX) 20 MG tablet Take 1 tablet (20 mg total) by mouth daily. 90 tablet 3  . glipiZIDE (GLUCOTROL) 10 MG tablet Take 10 mg by mouth 2 (two) times daily before a meal.    . Krill Oil 500 MG CAPS Take 500 mg by mouth. 1,500 mg BID    . lisinopril (PRINIVIL,ZESTRIL) 5 MG tablet Take 5 mg by mouth daily.    Marland Kitchen loperamide (IMODIUM A-D) 2 MG tablet Take 4 mg by mouth 2 (two) times daily.    Marland Kitchen LORazepam (ATIVAN) 0.5 MG tablet Take 1 tablet (0.5 mg total) by mouth every 4 (four) hours as needed for anxiety. Or as needed for nausea 60 tablet 0  . metFORMIN (GLUCOPHAGE) 1000 MG tablet Take 1,000 mg by mouth 2 (two) times daily with a meal.    . ondansetron (ZOFRAN) 8 MG tablet Take 1 tablet (8 mg total) by mouth every 8 (eight) hours as needed for nausea or vomiting. 30 tablet 2  . potassium chloride SA (K-DUR,KLOR-CON) 20 MEQ tablet Take 1 tablet (20 mEq total) by mouth daily. 90 tablet 3  . prochlorperazine (COMPAZINE) 10 MG tablet Take 1 tablet (10 mg total) by mouth every 6 (six) hours as needed for nausea or vomiting. 30  tablet 2  . promethazine (PHENERGAN) 25 MG tablet Take 1 tablet (25 mg total) by mouth every 6 (six) hours as needed for nausea or vomiting. 45 tablet 0  . sitaGLIPtin (JANUVIA) 100 MG tablet Take 100 mg by mouth daily.    Marland Kitchen venlafaxine (EFFEXOR) 75 MG tablet Take 75 mg by mouth 2 (two) times daily.     No current facility-administered medications for this visit.    Review of Systems  Constitutional:       Denies pain.  HENT: Negative.   Eyes: Negative.   Respiratory:  Negative.  Negative for shortness of breath.   Cardiovascular: Negative.  Negative for chest pain and leg swelling (Previous edema, resolved now.).  Gastrointestinal: Negative.  Negative for abdominal pain, blood in stool, constipation and diarrhea.  Genitourinary: Negative.  Negative for hematuria.  Musculoskeletal: Negative for joint pain.  Skin: Negative.   Neurological: Negative.   Endo/Heme/Allergies: Negative.   Psychiatric/Behavioral: Negative.        Lack of motivation due to slight depression  All other systems reviewed and are negative.  14 point ROS was done and is otherwise as detailed above or in HPI  PHYSICAL EXAMINATION:  Vitals: Blood pressure 85/55, pulse 79, respiratory 16, temperature 97.8, O2 sat 97% Physical Exam  Constitutional: She is oriented to person, place, and time and well-developed, well-nourished, and in no distress. No distress.  HENT:  Head: Normocephalic and atraumatic.  Nose: Nose normal.  Mouth/Throat: Oropharynx is clear and moist. No oropharyngeal exudate.  Eyes: Pupils are equal, round, and reactive to light. Conjunctivae and EOM are normal. No scleral icterus.  Neck: Normal range of motion. Neck supple. No JVD present.  Cardiovascular: Normal rate, regular rhythm and normal heart sounds.  Exam reveals no gallop and no friction rub.   No murmur heard. Pulmonary/Chest: Effort normal and breath sounds normal. No respiratory distress. She has no wheezes. She has no rales.    Abdominal: Soft. Bowel sounds are normal. She exhibits no distension and no mass. There is no tenderness. There is no rebound and no guarding.  Musculoskeletal: Normal range of motion. She exhibits no edema or tenderness.  Lymphadenopathy:    She has no cervical adenopathy.  Neurological: She is alert and oriented to person, place, and time. She has normal reflexes. No cranial nerve deficit. Gait normal. Coordination normal.  Skin: Skin is warm and dry. No rash noted. No erythema. No pallor.  Psychiatric: Mood, memory, affect and judgment normal.  Nursing note and vitals reviewed. Breast exam: Left breast with well healed scar, no masses, nipple discharge, or skin changes. Right breast without masses, nipple discharge, or skin changes. No axillary lymphadenopathy bilaterally. She has a mild rash underneath bilateral breasts.  LABORATORY DATA:  I have reviewed the data as listed CBC    Component Value Date/Time   WBC 4.9 11/27/2016 1057   RBC 4.57 11/27/2016 1057   HGB 13.7 11/27/2016 1057   HCT 40.1 11/27/2016 1057   PLT 147 (L) 11/27/2016 1057   MCV 87.7 11/27/2016 1057   MCH 30.0 11/27/2016 1057   MCHC 34.2 11/27/2016 1057   RDW 13.6 11/27/2016 1057   LYMPHSABS 0.8 11/27/2016 1057   MONOABS 0.3 11/27/2016 1057   EOSABS 0.1 11/27/2016 1057   BASOSABS 0.0 11/27/2016 1057   CMP Latest Ref Rng & Units 11/27/2016 05/31/2016 05/20/2016  Glucose 65 - 99 mg/dL 507(HH) 168(H) 285(H)  BUN 6 - 20 mg/dL 7 5(L) 10  Creatinine 0.44 - 1.00 mg/dL 0.68 0.62 0.99  Sodium 135 - 145 mmol/L 127(L) 135 133(L)  Potassium 3.5 - 5.1 mmol/L 3.9 3.6 3.8  Chloride 101 - 111 mmol/L 95(L) 102 99(L)  CO2 22 - 32 mmol/L _0 Calcium 8.9 - 10.3 mg/dL 8.9 9.2 9.7  Total Protein 6.5 - 8.1 g/dL 6.6 7.1 -  Total Bilirubin 0.3 - 1.2 mg/dL 0.6 0.6 -  Alkaline Phos 38 - 126 U/L 109 54 -  AST 15 - 41 U/L 47(H) 34 -  ALT 14 - 54 U/L 27 19 -  RADIOGRAPHIC STUDIES: I have personally reviewed the  radiological images as listed and agreed with the findings in the report. No results found.   CLINICAL DATA:  Initial treatment strategy for pulmonary nodules.  EXAM: NUCLEAR MEDICINE PET SKULL BASE TO THIGH  TECHNIQUE: 10.7 mCi F-18 FDG was injected intravenously. Full-ring PET imaging was performed from the skull base to thigh after the radiotracer. CT data was obtained and used for attenuation correction and anatomic localization.  FASTING BLOOD GLUCOSE:  Value: 116 mg/dl  COMPARISON:  CT chest 02/01/2016.  FINDINGS: NECK  No hypermetabolic lymph nodes in the neck. CT images show no acute findings.  CHEST  An irregular nodule in the posterior segment right upper lobe (series 8, image 29), is poorly measured on the current exam due to respiratory motion. It appears grossly similar from 02/01/2016 and has an SUV max of 1.5. Other pulmonary nodules are too small for PET resolution. No hypermetabolic mediastinal, hilar or axillary lymph nodes. A subcutaneous nodule along the medial right shoulder measures 1.4 x 2.1 cm (CT image 34) with an SUV max of 3.8. There is mild hypermetabolism associated with the left breast and left axilla, likely postoperative in etiology. Aortic atherosclerosis and coronary artery calcification. No pericardial or pleural effusion.  ABDOMEN/PELVIS  No abnormal hypermetabolism in the liver, adrenal glands, spleen or pancreas. No hypermetabolic lymph nodes. Liver margin is irregular. Cholecystectomy. Nodular thickening of the right adrenal gland. Left adrenal glands, kidneys, spleen, pancreas, stomach and bowel are grossly unremarkable. Varices are seen in the abdomen. No free fluid.  SKELETON  No abnormal osseous hypermetabolism.  IMPRESSION: 1. Majority of visualized pulmonary nodules are too small for PET resolution. Largest nodule in the right upper lobe is likely stable and does not show metabolism above blood pool.  Follow-up CT chest without contrast in 3 months is recommended in further evaluation, to document stability, as malignancy cannot be excluded. 2. Hypermetabolic subcutaneous nodule along the medial right shoulder. This the should be amenable to direct visualization/palpation. 3. Mild hypermetabolism associated with the left breast and left axilla, likely postoperative in etiology. Please correlate clinically. 4. Aortic atherosclerosis and coronary artery calcification. 5. Cirrhosis.   Electronically Signed   By: Lorin Picket M.D.   On: 04/03/2016 13:51  ASSESSMENT & PLAN:  1. Stage I ER- PR- HER 2 neu - carcinoma of the L breast Lumpectomy and sentinel LN biopsy with Dr. Ladona Horns on 11/30/2015 s/p 4 cycles of TC chemotherapy - Clinically NED on breast exam today. Continue observation - Bilateral diagnostic mammogram was negative. Repeat in one year, ordered today for May 2019. - RTC in 6 months for follow up and breast exam.  2. RUL pulmonary nodule - reviewed her last CT lung cancer screening protocol from July 2018 with her, the spiculated nodule is stable in size. -Per radiology recommendations, will order a repeat CT lung nodule protocol for July 2019.  - RTC in 6 months for follow up.   Orders Placed This Encounter  Procedures  . MM DIAG BREAST TOMO BILATERAL    Standing Status:   Future    Standing Expiration Date:   05/29/2018    Order Specific Question:   Reason for Exam (SYMPTOM  OR DIAGNOSIS REQUIRED)    Answer:   annual mammo    Order Specific Question:   Preferred imaging location?    Answer:   [facility] L[facility] DO[facility]    Standing Status:  Future    Standing Expiration Date:   05/29/2018    Order Specific Question:   Preferred imaging location?    Answer:   [facility]    Order Specific Question:   Radiology Contrast Protocol - do NOT remove file path    Answer:   \\[email or web address]  .  CBC with Differential    Standing Status:   Future    Standing Expiration Date:   05/29/2018  . Comprehensive metabolic panel    Standing Status:   Future    Standing Expiration Date:   05/29/2018   No orders of the defined types were placed in this encounter.   All questions were answered. The patient knows to call the clinic with any problems, questions or concerns.  This note was electronically signed.   This document serves as a record of services personally performed by Twana First, MD. It was created on her behalf by Maryla Morrow, a trained medical scribe. The creation of this record is based on the scribe's personal observations and the provider's statements to them. This document has been checked and approved by the attending provider.  I have reviewed the above documentation for accuracy and completeness and I agree with the above.  Twana First, MD 05/29/2017 10:25 AM

## 2017-05-31 DIAGNOSIS — Z23 Encounter for immunization: Secondary | ICD-10-CM | POA: Diagnosis not present

## 2017-07-25 DIAGNOSIS — F411 Generalized anxiety disorder: Secondary | ICD-10-CM | POA: Diagnosis not present

## 2017-07-25 DIAGNOSIS — F3289 Other specified depressive episodes: Secondary | ICD-10-CM | POA: Diagnosis not present

## 2017-07-25 DIAGNOSIS — I10 Essential (primary) hypertension: Secondary | ICD-10-CM | POA: Diagnosis not present

## 2017-07-25 DIAGNOSIS — E1165 Type 2 diabetes mellitus with hyperglycemia: Secondary | ICD-10-CM | POA: Diagnosis not present

## 2017-10-14 DIAGNOSIS — H35033 Hypertensive retinopathy, bilateral: Secondary | ICD-10-CM | POA: Diagnosis not present

## 2017-10-15 DIAGNOSIS — L719 Rosacea, unspecified: Secondary | ICD-10-CM | POA: Diagnosis not present

## 2017-10-15 DIAGNOSIS — L570 Actinic keratosis: Secondary | ICD-10-CM | POA: Diagnosis not present

## 2017-10-15 DIAGNOSIS — L219 Seborrheic dermatitis, unspecified: Secondary | ICD-10-CM | POA: Diagnosis not present

## 2017-10-23 DIAGNOSIS — I10 Essential (primary) hypertension: Secondary | ICD-10-CM | POA: Diagnosis not present

## 2017-10-23 DIAGNOSIS — F411 Generalized anxiety disorder: Secondary | ICD-10-CM | POA: Diagnosis not present

## 2017-10-23 DIAGNOSIS — E1165 Type 2 diabetes mellitus with hyperglycemia: Secondary | ICD-10-CM | POA: Diagnosis not present

## 2017-10-23 DIAGNOSIS — L659 Nonscarring hair loss, unspecified: Secondary | ICD-10-CM | POA: Diagnosis not present

## 2017-10-23 DIAGNOSIS — F3289 Other specified depressive episodes: Secondary | ICD-10-CM | POA: Diagnosis not present

## 2017-11-05 DIAGNOSIS — L219 Seborrheic dermatitis, unspecified: Secondary | ICD-10-CM | POA: Diagnosis not present

## 2017-11-05 DIAGNOSIS — L719 Rosacea, unspecified: Secondary | ICD-10-CM | POA: Diagnosis not present

## 2017-11-05 DIAGNOSIS — D485 Neoplasm of uncertain behavior of skin: Secondary | ICD-10-CM | POA: Diagnosis not present

## 2017-11-05 DIAGNOSIS — D2239 Melanocytic nevi of other parts of face: Secondary | ICD-10-CM | POA: Diagnosis not present

## 2017-11-06 DIAGNOSIS — H01025 Squamous blepharitis left lower eyelid: Secondary | ICD-10-CM | POA: Diagnosis not present

## 2017-11-06 DIAGNOSIS — H01022 Squamous blepharitis right lower eyelid: Secondary | ICD-10-CM | POA: Diagnosis not present

## 2017-11-06 DIAGNOSIS — H01021 Squamous blepharitis right upper eyelid: Secondary | ICD-10-CM | POA: Diagnosis not present

## 2017-11-06 DIAGNOSIS — H0012 Chalazion right lower eyelid: Secondary | ICD-10-CM | POA: Diagnosis not present

## 2017-11-06 DIAGNOSIS — H01024 Squamous blepharitis left upper eyelid: Secondary | ICD-10-CM | POA: Diagnosis not present

## 2017-11-26 ENCOUNTER — Ambulatory Visit (HOSPITAL_COMMUNITY): Payer: Medicare Other

## 2017-12-02 ENCOUNTER — Encounter (HOSPITAL_COMMUNITY): Payer: Medicare Other

## 2017-12-02 DIAGNOSIS — L719 Rosacea, unspecified: Secondary | ICD-10-CM | POA: Diagnosis not present

## 2017-12-02 DIAGNOSIS — L309 Dermatitis, unspecified: Secondary | ICD-10-CM | POA: Diagnosis not present

## 2017-12-04 ENCOUNTER — Inpatient Hospital Stay (HOSPITAL_COMMUNITY): Payer: Medicare Other | Admitting: Hematology

## 2017-12-16 ENCOUNTER — Encounter (HOSPITAL_COMMUNITY): Payer: Self-pay

## 2017-12-16 ENCOUNTER — Ambulatory Visit (HOSPITAL_COMMUNITY)
Admission: RE | Admit: 2017-12-16 | Discharge: 2017-12-16 | Disposition: A | Discharge status: Home / Self Care | Payer: Medicare Other | Source: Ambulatory Visit | Attending: Oncology | Admitting: Oncology

## 2017-12-16 DIAGNOSIS — C50412 Malignant neoplasm of upper-outer quadrant of left female breast: Secondary | ICD-10-CM | POA: Insufficient documentation

## 2017-12-16 DIAGNOSIS — R921 Mammographic calcification found on diagnostic imaging of breast: Secondary | ICD-10-CM | POA: Diagnosis not present

## 2017-12-18 ENCOUNTER — Encounter: Payer: Self-pay | Admitting: Cardiovascular Disease

## 2017-12-18 ENCOUNTER — Ambulatory Visit (INDEPENDENT_AMBULATORY_CARE_PROVIDER_SITE_OTHER): Payer: Medicare Other | Admitting: Cardiovascular Disease

## 2017-12-18 VITALS — BP 122/68 | HR 74 | Wt 203.8 lb

## 2017-12-18 DIAGNOSIS — Z136 Encounter for screening for cardiovascular disorders: Secondary | ICD-10-CM

## 2017-12-18 NOTE — Progress Notes (Signed)
Cardiology Office Note   Date:  12/18/2017   ID:  Taylor Williams, DOB 01/16/1951, MRN [id number]  PCP:  [name], MD  Cardiologist: Oswaldo Conroy, MD   No chief complaint on file.     History of Present Illness: Taylor Williams is a 67 y.o. female who presents for ongoing assessment and management of cardiovascular risk factors, grade 2 diastolic dysfunction  hypertension, hypercholesterolemia, other history includes breast cancer being followed by Dr. Whitney Muse, ongoing tobacco abuse and diabetes.  The patient was last seen by me  on 05/20/2016, and was found to have some lower extremity edema. He did not feel it was related to LV systolic function and doubt related to "diastolic abnormalities. She was continued on Lasix. She was finally hypotensive with dizziness and lisinopril HCTZ was stopped, she was only put on lisinopril 5 mg for renal protection. Follow-up TSH cortisol BMET and 24-hour urine for protein was ordered.  Her chemotherapy and radiation was completed just before Thanksgiving 2017.    Reviewed echo done 08/02/16 EF 55-60% moderate LVH no comment on diastole Moderate LAE   Has f/u with oncology next month but prognosis is good   Past Medical History:  Diagnosis Date  . Anxiety   . Breast cancer ([place])   . Depression   . High cholesterol   . Hypertension     Past Surgical History:  Procedure Laterality Date  . BREAST LUMPECTOMY Left    states for ductal carcinoma, had surgery in late may  . COLONOSCOPY    . galbladder    . GANGLION CYST EXCISION    . TONSILLECTOMY AND ADENOIDECTOMY       Current Outpatient Medications  Medication Sig Dispense Refill  . ALPRAZolam (XANAX) 0.25 MG tablet Take 0.25 mg by mouth 2 (two) times daily.    . cetirizine (ZYRTEC) 10 MG tablet Take 10 mg by mouth daily.    . citalopram (CELEXA) 20 MG tablet Take 20 mg by mouth daily.    . diphenoxylate-atropine (LOMOTIL) 2.5-0.025 MG tablet May take 1-2 tabs four times a day prn  loose stools. 60 tablet 1  . furosemide (LASIX) 20 MG tablet Take 1 tablet (20 mg total) by mouth daily. 90 tablet 3  . glipiZIDE (GLUCOTROL) 10 MG tablet Take 10 mg by mouth 2 (two) times daily before a meal.    . insulin glargine (LANTUS) 100 UNIT/ML injection Inject into the skin at bedtime.    Javier Docker Oil 500 MG CAPS Take 500 mg by mouth. 1,500 mg BID    . lisinopril (PRINIVIL,ZESTRIL) 5 MG tablet Take 5 mg by mouth daily.    Marland Kitchen loperamide (IMODIUM A-D) 2 MG tablet Take 4 mg by mouth 2 (two) times daily.    . metFORMIN (GLUCOPHAGE) 1000 MG tablet Take 1,000 mg by mouth 2 (two) times daily with a meal.    . potassium chloride SA (K-DUR,KLOR-CON) 20 MEQ tablet Take 1 tablet (20 mEq total) by mouth daily. 90 tablet 3  . sitaGLIPtin (JANUVIA) 100 MG tablet Take 100 mg by mouth daily.    Marland Kitchen venlafaxine (EFFEXOR) 75 MG tablet Take 75 mg by mouth 2 (two) times daily.     No current facility-administered medications for this visit.     Allergies:   Aspirin; Other; and Fish allergy    Social History:  The patient  reports that she has been smoking.  She started smoking about 52 years ago. She has a 30.00 pack-year smoking history. She  has never used smokeless tobacco. She reports that she does not drink alcohol or use drugs.   Family History:  The patient's family history includes Cancer in her paternal grandfather and paternal grandmother; Cancer - Other in her mother; Dementia in her father; Diabetes in her father; Hypertension in her father; Hyperthyroidism in her brother.    ROS: All other systems are reviewed and negative. Unless otherwise mentioned in H&P    PHYSICAL EXAM: VS:  BP 122/68   Pulse 74   Wt 203 lb 12.8 oz (92.4 kg)   SpO2 98%   BMI 30.99 kg/m  , BMI Body mass index is 30.99 kg/m. Affect appropriate Healthy:  appears stated age 64: normal Neck supple with no adenopathy JVP normal no bruits no thyromegaly Lungs clear with no wheezing and good diaphragmatic  motion Heart:  S1/S2 no murmur, no rub, gallop or click PMI normal Abdomen: benighn, BS positve, no tenderness, no AAA no bruit.  No HSM or HJR Distal pulses intact with no bruits Plus one bilateral ankle edema with varicosities Neuro non-focal Skin warm and dry No muscular weakness    Recent Labs: No results found for requested labs within last 8760 hours.     Wt Readings from Last 3 Encounters:  12/18/17 203 lb 12.8 oz (92.4 kg)  05/29/17 216 lb (98 kg)  11/27/16 229 lb 4.8 oz (104 kg)      Other studies Reviewed: Echocardiogram: 04/25/2016 Left ventricle: The cavity size was normal. Wall thickness was   increased in a pattern of mild LVH. Systolic function was normal.   The estimated ejection fraction was in the range of 60% to 65%.   Wall motion was normal; there were no regional wall motion   abnormalities. Features are consistent with a pseudonormal left   ventricular filling pattern, with concomitant abnormal relaxation   and increased filling pressure (grade 2 diastolic dysfunction). - Aortic valve: Mildly calcified annulus. Trileaflet. - Mitral valve: Calcified annulus. There was trivial regurgitation. - Left atrium: The atrium was mildly dilated. - Right atrium: Central venous pressure (est): 3 mm Hg. - Tricuspid valve: There was trivial regurgitation. - Pulmonary arteries: Systolic pressure could not be accurately   estimated. - Pericardium, extracardiac: A prominent pericardial fat pad was   present.  ASSESSMENT AND PLAN:  1.  Chronic diastolic CHF: She has no complaints or lower extremity edema, abdominal distention, or significant weight gain. She is doing very well on current medication regimen of Lasix 20 mg daily, lisinopril 5 mg daily. Potassium replacement. Echo done 08/02/16 normal  Knows to take extra lasix for edema   2. Hypertension: Blood pressure has excellent control currently. Lisinopril 5 mg daily will be continued. No other changes at this  time.  3. Carcinoma of the upper outer quadrant of the left breast: She's being followed by Dr. Whitney Muse. Ongoing labs and evaluation continued through their service.    Current medicines are reviewed at length with the patient today.    Labs/ tests ordered today include: None   Orders Placed This Encounter  Procedures  . EKG 12-Lead     Disposition:   FU with cardiology PRN  Signed, Jenkins Rouge, MD  12/18/2017 2:29 PM    [place]. [address], [place], [place] [address] Phone: [phone]; Fax: [phone]

## 2017-12-18 NOTE — Patient Instructions (Signed)

## 2017-12-23 ENCOUNTER — Other Ambulatory Visit: Payer: Self-pay

## 2017-12-23 ENCOUNTER — Inpatient Hospital Stay (HOSPITAL_COMMUNITY): Payer: Medicare Other

## 2017-12-23 ENCOUNTER — Inpatient Hospital Stay (HOSPITAL_COMMUNITY): Payer: Medicare Other | Attending: Hematology | Admitting: Internal Medicine

## 2017-12-23 VITALS — BP 116/68 | HR 74 | Temp 97.6°F | Resp 20 | Ht 68.0 in | Wt 202.8 lb

## 2017-12-23 DIAGNOSIS — I10 Essential (primary) hypertension: Secondary | ICD-10-CM

## 2017-12-23 DIAGNOSIS — C50412 Malignant neoplasm of upper-outer quadrant of left female breast: Secondary | ICD-10-CM

## 2017-12-23 DIAGNOSIS — Z853 Personal history of malignant neoplasm of breast: Secondary | ICD-10-CM

## 2017-12-23 DIAGNOSIS — R911 Solitary pulmonary nodule: Secondary | ICD-10-CM | POA: Diagnosis not present

## 2017-12-23 DIAGNOSIS — B379 Candidiasis, unspecified: Secondary | ICD-10-CM | POA: Insufficient documentation

## 2017-12-23 DIAGNOSIS — L659 Nonscarring hair loss, unspecified: Secondary | ICD-10-CM | POA: Diagnosis not present

## 2017-12-23 DIAGNOSIS — D6489 Other specified anemias: Secondary | ICD-10-CM

## 2017-12-23 LAB — CBC WITH DIFFERENTIAL/PLATELET
BASOS ABS: 0 10*3/uL (ref 0.0–0.1)
Basophils Relative: 1 %
EOS PCT: 3 %
Eosinophils Absolute: 0.2 10*3/uL (ref 0.0–0.7)
HCT: 42.2 % (ref 36.0–46.0)
Hemoglobin: 14 g/dL (ref 12.0–15.0)
LYMPHS PCT: 21 %
Lymphs Abs: 1.1 10*3/uL (ref 0.7–4.0)
MCH: 29.8 pg (ref 26.0–34.0)
MCHC: 33.2 g/dL (ref 30.0–36.0)
MCV: 89.8 fL (ref 78.0–100.0)
Monocytes Absolute: 0.6 10*3/uL (ref 0.1–1.0)
Monocytes Relative: 11 %
NEUTROS ABS: 3.2 10*3/uL (ref 1.7–7.7)
Neutrophils Relative %: 64 %
PLATELETS: 167 10*3/uL (ref 150–400)
RBC: 4.7 MIL/uL (ref 3.87–5.11)
RDW: 15 % (ref 11.5–15.5)
WBC: 5.1 10*3/uL (ref 4.0–10.5)

## 2017-12-23 LAB — TSH: TSH: 1.057 u[IU]/mL (ref 0.350–4.500)

## 2017-12-23 LAB — COMPREHENSIVE METABOLIC PANEL
ALT: 34 U/L (ref 14–54)
AST: 45 U/L — AB (ref 15–41)
Albumin: 3.3 g/dL — ABNORMAL LOW (ref 3.5–5.0)
Alkaline Phosphatase: 72 U/L (ref 38–126)
Anion gap: 8 (ref 5–15)
BUN: 9 mg/dL (ref 6–20)
CHLORIDE: 102 mmol/L (ref 101–111)
CO2: 25 mmol/L (ref 22–32)
CREATININE: 0.61 mg/dL (ref 0.44–1.00)
Calcium: 9.4 mg/dL (ref 8.9–10.3)
GFR calc Af Amer: >60 mL/min (ref >60)
GLUCOSE: 147 mg/dL — AB (ref 65–99)
Potassium: 4.6 mmol/L (ref 3.5–5.1)
SODIUM: 135 mmol/L (ref 135–145)
Total Bilirubin: 0.6 mg/dL (ref 0.3–1.2)
Total Protein: 6.9 g/dL (ref 6.5–8.1)

## 2017-12-23 LAB — LACTATE DEHYDROGENASE: LDH: 106 U/L (ref 98–192)

## 2017-12-23 NOTE — Patient Instructions (Signed)
[place] at [facility]  Discharge Instructions:   Today you saw Dr. Walden Field.    _______________________________________________________________  Thank you for choosing [place] at [facility] to provide your oncology and hematology care.  To afford each patient quality time with our providers, please arrive at least 15 minutes before your scheduled appointment.  You need to re-schedule your appointment if you arrive 10 or more minutes late.  We strive to give you quality time with our providers, and arriving late affects you and other patients whose appointments are after yours.  Also, if you no show three or more times for appointments you may be dismissed from the clinic.  Again, thank you for choosing [place] at [facility] hope is that these requests will allow you access to exceptional care and in a timely manner. _______________________________________________________________  If you have questions after your visit, please contact our office at [phone] between the hours of 8:30 a.m. and 5:00 p.m. Voicemails left after 4:30 p.m. will not be returned until the following business day. _______________________________________________________________  For prescription refill requests, have your pharmacy contact our office. _______________________________________________________________  Recommendations made by the consultant and any test results will be sent to your referring physician. _______________________________________________________________

## 2017-12-23 NOTE — Progress Notes (Signed)
Diagnosis Carcinoma of upper-outer quadrant of left female breast, unspecified estrogen receptor status ([name]) - Plan: MM DIAG BREAST TOMO UNI LEFT, CBC with Differential/Platelet, Comprehensive metabolic panel, Lactate dehydrogenase, TSH  Anemia due to other cause, not classified - Plan: Ferritin  Staging Williams Staging Carcinoma of upper-outer quadrant of left female breast [name]) Staging form: Breast, AJCC 7th Edition - Pathologic stage from 11/30/2015: Stage IA (T1b, N0, cM0) - Signed by Taylor Cancer, PA-C on 01/19/2016   Assessment and Plan:  1. Stage I ER- PR- HER 2 neu - carcinoma of the L breast.  Pt is s/p Lumpectomy and sentinel LN biopsy with Dr. Ladona Williams on 11/30/2015 s/p 4 cycles of TC chemotherapy.  Pt had bilateral diagnostic mammogram done 12/16/2017 that showed  1. Probably benign left lumpectomy bed calcifications felt to represent early fat necrosis. Recommendation is for six-month mammographic follow-up with spot magnification views. 2. No mammographic evidence of malignancy on the right.  Pt is recommended for left breast diagnostic mammogram in 05/2018.  This is set up today and pt will RTC in 05/2018 for follow-up to go over results.  Labs reviewed and WNL.    2. RUL pulmonary nodule.   CT lung Williams screening protocol from July 2018 showed the spiculated nodule is stable in size.  She is recommended for CT lung nodule protocol in 01/2018.  She reports scan is already set up.  May consider Additional imaging once results reviewed.    3.  Varicosities.  Pt has large varicose veins noted in bilateral LE.  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  She is referred to vascular for evaluation.    4.  Breast candidal yeast infection.  This is noted on breast exam.  She reports she has used a powder for this in the past and would like that medication again.  She will notify the clinic of the medicine and it will be prescribed.    5.  Teeth questions and loss of  hair.  I discussed with her to try a hair vitamin.  I also recommended she follow-up with dentist and smoking may also affect teeth and gums. TSH normal at 1.057.     6.  Smoking.  Cessation is recommended.  She is scheduled for CT of chest 01/2018 and will follow-up to go over results.  Additional imaging may be recommended pending results.    7.  HTN.  BP is 116/68.  Follow-up with PCP.    8.  Health maintenance.  Continue colonoscopy and bone density evaluation as recommended by GI and PCP.    Current Status:  Pt is seen today for follow-up.  She is complaining of hair thinning, problems with her teeth and is wondering if this was due to chemotherapy.   She also reports some LE leg swelling.      Carcinoma of upper-outer quadrant of left female breast ([name])   11/30/2015 Surgery    Lumpectomy and sentinel node procedure with Dr. Ladona Williams      11/30/2015 Surgery    Needle localization of the L breast with mammo guidance      11/30/2015 Pathology Results    L breast invasive ductal carcinoma, mod diff, 0.8 cm, focal DCIS, high grade, 2/2 LN negative, no LVI, pT1b, pN0M0, ER- PR- HER2 - Ki-67 92%      01/12/2016 - 03/15/2016 Chemotherapy    TC x 4 cycles      02/01/2016 Imaging    CT chest  screening- Lung-RADS Category 4B, suspicious. Additional imaging evaluation or consultation with pulmonary medicine or thoracic surgery recommended. Bilateral pulmonary nodules, including an inferior right upper lobe spiculated 11 mm nodule.       03/13/2016 Procedure    Port removed by Dr. Ladona Williams due to concerns for infection.      03/14/2016 Procedure    PICC line placed      04/03/2016 PET scan    Majority of visualized pulmonary nodules are too small for PET resolution. Largest nodule in the right upper lobe is likely stable and does not show metabolism above blood pool. Follow-up CT chest without contrast in 3 months is recommended in further evaluation, to document stability, as malignancy cannot be  excluded.Hypermetabolic subcutaneous nodule along the medial right shoulder. This the should be amenable to direct Visualization/palpation. Mild hypermetabolism associated with the left breast and left axilla, likely postoperative in etiology. Please correlate clinically. Aortic atherosclerosis and coronary artery calcification.Cirrhosis.        Problem List Patient Active Problem List   Diagnosis Date Noted  . Solitary pulmonary nodule [R91.1] 07/25/2016  . High blood sugar [R73.9] 02/02/2016  . Diarrhea [R19.7] 01/19/2016  . Carcinoma of upper-outer quadrant of left female breast [name]) [C50.412] 01/01/2016    Past Medical History Past Medical History:  Diagnosis Date  . Anxiety   . Breast Williams ([place])   . Depression   . High cholesterol   . Hypertension     Past Surgical History Past Surgical History:  Procedure Laterality Date  . BREAST LUMPECTOMY Left    states for ductal carcinoma, had surgery in late may  . COLONOSCOPY    . galbladder    . GANGLION CYST EXCISION    . TONSILLECTOMY AND ADENOIDECTOMY      Family History Family History  Problem Relation Age of Onset  . Williams - Other Mother   . Hypertension Father   . Diabetes Father   . Dementia Father   . Hyperthyroidism Brother   . Williams Paternal Grandmother   . Williams Paternal Grandfather      Social History  reports that she has been smoking.  She started smoking about 52 years ago. She has a 30.00 pack-year smoking history. She has never used smokeless tobacco. She reports that she does not drink alcohol or use drugs.  Medications  Current Outpatient Medications:  .  ALPRAZolam (XANAX) 0.25 MG tablet, Take 0.25 mg by mouth 2 (two) times daily., Disp: , Rfl:  .  cetirizine (ZYRTEC) 10 MG tablet, Take 10 mg by mouth daily., Disp: , Rfl:  .  citalopram (CELEXA) 20 MG tablet, Take 20 mg by mouth daily., Disp: , Rfl:  .  diphenoxylate-atropine (LOMOTIL) 2.5-0.025 MG tablet, May take 1-2 tabs four times a  day prn loose stools., Disp: 60 tablet, Rfl: 1 .  furosemide (LASIX) 20 MG tablet, Take 1 tablet (20 mg total) by mouth daily., Disp: 90 tablet, Rfl: 3 .  glipiZIDE (GLUCOTROL) 10 MG tablet, Take 10 mg by mouth 2 (two) times daily before a meal., Disp: , Rfl:  .  insulin glargine (LANTUS) 100 UNIT/ML injection, Inject into the skin at bedtime., Disp: , Rfl:  .  Krill Oil 500 MG CAPS, Take 500 mg by mouth. 1,500 mg BID, Disp: , Rfl:  .  lisinopril (PRINIVIL,ZESTRIL) 5 MG tablet, Take 5 mg by mouth daily., Disp: , Rfl:  .  loperamide (IMODIUM A-D) 2 MG tablet, Take 4 mg by mouth 2 (two) times daily.,  Disp: , Rfl:  .  metFORMIN (GLUCOPHAGE) 1000 MG tablet, Take 1,000 mg by mouth 2 (two) times daily with a meal., Disp: , Rfl:  .  potassium chloride SA (K-DUR,KLOR-CON) 20 MEQ tablet, Take 1 tablet (20 mEq total) by mouth daily., Disp: 90 tablet, Rfl: 3 .  sitaGLIPtin (JANUVIA) 100 MG tablet, Take 100 mg by mouth daily., Disp: , Rfl:  .  venlafaxine (EFFEXOR) 75 MG tablet, Take 75 mg by mouth 2 (two) times daily., Disp: , Rfl:   Allergies Aspirin; Other; and Fish allergy  Review of Systems Review of Systems - Oncology ROS as per HPI otherwise 12 point ROS is negative.   Physical Exam  Vitals Wt Readings from Last 3 Encounters:  12/23/17 202 lb 12.8 oz (92 kg)  12/18/17 203 lb 12.8 oz (92.4 kg)  05/29/17 216 lb (98 kg)   Temp Readings from Last 3 Encounters:  12/23/17 97.6 F (36.4 C) (Oral)  05/29/17 97.8 F (36.6 C) (Oral)  11/27/16 97.8 F (36.6 C) (Oral)   BP Readings from Last 3 Encounters:  12/23/17 116/68  12/18/17 122/68  05/29/17 (!) 85/55   Pulse Readings from Last 3 Encounters:  12/23/17 74  12/18/17 74  05/29/17 79   Constitutional: Well-developed, well-nourished, and in no distress.   HENT: Head: Normocephalic and atraumatic.  Mouth/Throat: No oropharyngeal exudate. Mucosa moist. Eyes: Pupils are equal, round, and reactive to light. Conjunctivae are normal. No  scleral icterus.  Neck: Normal range of motion. Neck supple. No JVD present.  Cardiovascular: Normal rate, regular rhythm and normal heart sounds.  Exam reveals no gallop and no friction rub.   No murmur heard. Pulmonary/Chest: Effort normal and breath sounds normal. No respiratory distress. No wheezes.No rales.  Abdominal: Soft. Bowel sounds are normal. No distension. There is no tenderness. There is no guarding.  Musculoskeletal: Large varicosities noted especially on right LE.  1+ edema noted bilaterally.   Lymphadenopathy: No cervical, axillary or supraclavicular adenopathy.  Neurological: Alert and oriented to person, place, and time. No cranial nerve deficit.  Skin: Skin is warm and dry. No rash noted. No erythema. No pallor.  Psychiatric: Affect and judgment normal.  Bilateral breast exam.  Chaperone present.  Lumpectomy changes noted on left.  No dominant masses palpable bilaterally.    Labs Appointment on 12/23/2017  Component Date Value Ref Range Status  . WBC 12/23/2017 5.1  4.0 - 10.5 K/uL Final  . RBC 12/23/2017 4.70  3.87 - 5.11 MIL/uL Final  . Hemoglobin 12/23/2017 14.0  12.0 - 15.0 g/dL Final  . HCT 12/23/2017 42.2  36.0 - 46.0 % Final  . MCV 12/23/2017 89.8  78.0 - 100.0 fL Final  . MCH 12/23/2017 29.8  26.0 - 34.0 pg Final  . MCHC 12/23/2017 33.2  30.0 - 36.0 g/dL Final  . RDW 12/23/2017 15.0  11.5 - 15.5 % Final  . Platelets 12/23/2017 167  150 - 400 K/uL Final  . Neutrophils Relative % 12/23/2017 64  % Final  . Neutro Abs 12/23/2017 3.2  1.7 - 7.7 K/uL Final  . Lymphocytes Relative 12/23/2017 21  % Final  . Lymphs Abs 12/23/2017 1.1  0.7 - 4.0 K/uL Final  . Monocytes Relative 12/23/2017 11  % Final  . Monocytes Absolute 12/23/2017 0.6  0.1 - 1.0 K/uL Final  . Eosinophils Relative 12/23/2017 3  % Final  . Eosinophils Absolute 12/23/2017 0.2  0.0 - 0.7 K/uL Final  . Basophils Relative 12/23/2017 1  % Final  .  Basophils Absolute 12/23/2017 0.0  0.0 - 0.1 K/uL Final    Performed at [facility], [address], [place], [place] [address]  . Sodium 12/23/2017 135  135 - 145 mmol/L Final  . Potassium 12/23/2017 4.6  3.5 - 5.1 mmol/L Final  . Chloride 12/23/2017 102  101 - 111 mmol/L Final  . CO2 12/23/2017 25  22 - 32 mmol/L Final  . Glucose, Bld 12/23/2017 147* 65 - 99 mg/dL Final  . BUN 12/23/2017 9  6 - 20 mg/dL Final  . Creatinine, Ser 12/23/2017 0.61  0.44 - 1.00 mg/dL Final  . Calcium 12/23/2017 9.4  8.9 - 10.3 mg/dL Final  . Total Protein 12/23/2017 6.9  6.5 - 8.1 g/dL Final  . Albumin 12/23/2017 3.3* 3.5 - 5.0 g/dL Final  . AST 12/23/2017 45* 15 - 41 U/L Final  . ALT 12/23/2017 34  14 - 54 U/L Final  . Alkaline Phosphatase 12/23/2017 72  38 - 126 U/L Final  . Total Bilirubin 12/23/2017 0.6  0.3 - 1.2 mg/dL Final  . GFR calc non Af Amer 12/23/2017 >60  >60 mL/min Final  . GFR calc Af Amer 12/23/2017 >60  >60 mL/min Final   Comment: (NOTE) The eGFR has been calculated using the CKD EPI equation. This calculation has not been validated in all clinical situations. eGFR's persistently <60 mL/min signify possible Chronic Kidney Disease.   Georgiann Hahn gap 12/23/2017 8  5 - 15 Final   Performed at [facility], [address] [place], [place] [address]  . LDH 12/23/2017 106  98 - 192 U/L Final   Performed at [facility], [address] [place], [place] [address]  . TSH 12/23/2017 1.057  0.350 - 4.500 uIU/mL Final   Comment: Performed by a 3rd Generation assay with a functional sensitivity of <=0.01 uIU/mL. Performed at [facility], [address], [place], [place] [address]      Pathology Orders Placed This Encounter  Procedures  . MM DIAG BREAST TOMO UNI LEFT    Standing Status:   Future    Standing Expiration Date:   12/24/2018    Order Specific Question:   Reason for Exam (SYMPTOM  OR DIAGNOSIS REQUIRED)    Answer:   left breast Williams    Order Specific Question:   Preferred imaging location?    Answer:   [facility]  . CBC with  Differential/Platelet    Standing Status:   Future    Number of Occurrences:   1    Standing Expiration Date:   12/24/2018  . Comprehensive metabolic panel    Standing Status:   Future    Number of Occurrences:   1    Standing Expiration Date:   12/24/2018  . Lactate dehydrogenase    Standing Status:   Future    Number of Occurrences:   1    Standing Expiration Date:   12/24/2018  . TSH    Standing Status:   Future    Number of Occurrences:   1    Standing Expiration Date:   12/24/2018  . Ferritin    Standing Status:   Future    Standing Expiration Date:   12/24/2018       Zoila Shutter MD

## 2017-12-30 ENCOUNTER — Encounter: Payer: Self-pay | Admitting: Vascular Surgery

## 2017-12-30 ENCOUNTER — Ambulatory Visit (INDEPENDENT_AMBULATORY_CARE_PROVIDER_SITE_OTHER): Payer: Medicare Other | Admitting: Vascular Surgery

## 2017-12-30 ENCOUNTER — Other Ambulatory Visit: Payer: Self-pay

## 2017-12-30 VITALS — BP 119/74 | HR 69 | Resp 18 | Ht 68.0 in | Wt 203.6 lb

## 2017-12-30 DIAGNOSIS — I83893 Varicose veins of bilateral lower extremities with other complications: Secondary | ICD-10-CM | POA: Diagnosis not present

## 2017-12-30 NOTE — Progress Notes (Signed)
Subjective:     Patient ID: Taylor Williams, female   DOB: 1951/02/14, 67 y.o.   MRN: [id number]  HPI This 67 year old female was referred by Dr. Walden Field for evaluation of bilateral varicose veins with swelling. She has hypersensitivity in both ankle areas were she has spider veins and she has bulging ropey veins right worse than left in the medial thigh and calf areas. She has no history of stasis ulcers bleeding DVT thrombophlebitis. She occasionally where short leg elastic compression stockings been on a regular basis. Her swelling worsens as the day progresses. She will also developed heavy aching and throbbing discomfort in the bulges.  Past Medical History:  Diagnosis Date  . Anemia   . Anxiety   . Breast cancer ([place])   . Depression   . Diabetes mellitus without complication ([place])   . High cholesterol   . Hypertension     Social History   Tobacco Use  . Smoking status: Current Every Day Smoker    Packs/day: 1.00    Years: 40.00    Pack years: 40.00    Start date: 10/28/1965  . Smokeless tobacco: Never Used  Substance Use Topics  . Alcohol use: No    Family History  Problem Relation Age of Onset  . Cancer - Other Mother   . Hypertension Father   . Diabetes Father   . Dementia Father   . Hyperthyroidism Brother   . Cancer Paternal Grandmother   . Cancer Paternal Grandfather     Allergies  Allergen Reactions  . Aspirin Swelling    Facial swelling and panic attack when she took pain medication that contained aspirin  . Other Swelling    novacaine - patient states reaction happened as a child and she believes it caused facial swelling, mother always told her the MD said never take again  . Fish Allergy Other (See Comments)    gagging     Current Outpatient Medications:  .  ALPRAZolam (XANAX) 0.25 MG tablet, Take 0.25 mg by mouth 2 (two) times daily., Disp: , Rfl:  .  cetirizine (ZYRTEC) 10 MG tablet, Take 10 mg by mouth daily., Disp: , Rfl:  .  citalopram (CELEXA) 20  MG tablet, Take 20 mg by mouth daily., Disp: , Rfl:  .  diphenoxylate-atropine (LOMOTIL) 2.5-0.025 MG tablet, May take 1-2 tabs four times a day prn loose stools., Disp: 60 tablet, Rfl: 1 .  furosemide (LASIX) 20 MG tablet, Take 1 tablet (20 mg total) by mouth daily., Disp: 90 tablet, Rfl: 3 .  glipiZIDE (GLUCOTROL) 10 MG tablet, Take 10 mg by mouth 2 (two) times daily before a meal., Disp: , Rfl:  .  insulin glargine (LANTUS) 100 UNIT/ML injection, Inject 2.4 Units into the skin at bedtime. , Disp: , Rfl:  .  Krill Oil 500 MG CAPS, Take 500 mg by mouth. 1,500 mg BID, Disp: , Rfl:  .  lisinopril (PRINIVIL,ZESTRIL) 5 MG tablet, Take 5 mg by mouth daily., Disp: , Rfl:  .  loperamide (IMODIUM A-D) 2 MG tablet, Take 4 mg by mouth 2 (two) times daily., Disp: , Rfl:  .  metFORMIN (GLUCOPHAGE) 1000 MG tablet, Take 1,000 mg by mouth 2 (two) times daily with a meal., Disp: , Rfl:  .  potassium chloride SA (K-DUR,KLOR-CON) 20 MEQ tablet, Take 1 tablet (20 mEq total) by mouth daily., Disp: 90 tablet, Rfl: 3 .  sitaGLIPtin (JANUVIA) 100 MG tablet, Take 100 mg by mouth daily., Disp: , Rfl:  .  venlafaxine (  EFFEXOR) 75 MG tablet, Take 75 mg by mouth 2 (two) times daily., Disp: , Rfl:   Vitals:   12/30/17 1541  BP: 119/74  Pulse: 69  Resp: 18  SpO2: 99%  Weight: 203 lb 9.6 oz (92.4 kg)  Height: 5' 8"  (1.727 m)    Body mass index is 30.96 kg/m.         Review of Systems Denies chest pain, dyspnea on exertion, PND, orthopnea, hemoptysis, claudication    Objective:   Physical Exam BP 119/74 (BP Location: Left Arm, Patient Position: Sitting, Cuff Size: Normal)   Pulse 69   Resp 18   Ht 5' 8"  (1.727 m)   Wt 203 lb 9.6 oz (92.4 kg)   SpO2 99%   BMI 30.96 kg/m     Gen.-alert and oriented x3 in no apparent distress HEENT normal for age Lungs no rhonchi or wheezing Cardiovascular regular rhythm no murmurs carotid pulses 3+ palpable no bruits audible Abdomen soft nontender no palpable  masses Musculoskeletal free of  major deformities Skin clear -no rashes Neurologic normal Lower extremities 3+ femoral and dorsalis pedis pulses palpable bilaterally with 1+ edema bilaterally Reticular and spider veins in the medial and lateral malleolar areas of both ankles with no active ulcer. Early hyperpigmentation. Bulging varicosities in the medial right thigh and medial calf greater than the left side.  Today I performed a bedside SonoSite ultrasound exam in both great saphenous veins are enlarged with gross reflux which is quite obvious in the distal thigh     Assessment:     Bilateral painful varicosities due to gross reflux bilateral great saphenous veins causing symptoms which are affecting patient's daily living-CEAP2    Plan:         #1 long leg elastic compression stockings 20-30 mm gradient #2 elevate legs as much as possible #3 ibuprofen daily on a regular basis for pain #4 return in 3 months-she will have formal venous reflux exam at that time and formal recommendation will be made by Dr. fields or Dr. Scot Dock It appears that she may be a candidate for laser ablation of bilateral great saphenous vein followed by three-month waiting period and evaluated for stab phlebectomy versus foam sclerotherapy Return in 3 months

## 2018-01-21 DIAGNOSIS — F3289 Other specified depressive episodes: Secondary | ICD-10-CM | POA: Diagnosis not present

## 2018-01-21 DIAGNOSIS — I10 Essential (primary) hypertension: Secondary | ICD-10-CM | POA: Diagnosis not present

## 2018-01-21 DIAGNOSIS — F411 Generalized anxiety disorder: Secondary | ICD-10-CM | POA: Diagnosis not present

## 2018-01-21 DIAGNOSIS — E1165 Type 2 diabetes mellitus with hyperglycemia: Secondary | ICD-10-CM | POA: Diagnosis not present

## 2018-02-10 ENCOUNTER — Ambulatory Visit (HOSPITAL_COMMUNITY)
Admission: RE | Admit: 2018-02-10 | Discharge: 2018-02-10 | Disposition: A | Discharge status: Home / Self Care | Payer: Medicare Other | Source: Ambulatory Visit | Attending: Oncology | Admitting: Oncology

## 2018-02-10 DIAGNOSIS — K7460 Unspecified cirrhosis of liver: Secondary | ICD-10-CM | POA: Insufficient documentation

## 2018-02-10 DIAGNOSIS — I272 Pulmonary hypertension, unspecified: Secondary | ICD-10-CM | POA: Diagnosis not present

## 2018-02-10 DIAGNOSIS — I2510 Atherosclerotic heart disease of native coronary artery without angina pectoris: Secondary | ICD-10-CM | POA: Diagnosis not present

## 2018-02-10 DIAGNOSIS — I700 Atherosclerosis of aorta: Secondary | ICD-10-CM | POA: Diagnosis not present

## 2018-02-10 DIAGNOSIS — R911 Solitary pulmonary nodule: Secondary | ICD-10-CM | POA: Diagnosis not present

## 2018-02-10 DIAGNOSIS — F17200 Nicotine dependence, unspecified, uncomplicated: Secondary | ICD-10-CM | POA: Diagnosis not present

## 2018-02-10 DIAGNOSIS — R918 Other nonspecific abnormal finding of lung field: Secondary | ICD-10-CM | POA: Diagnosis not present

## 2018-03-19 ENCOUNTER — Other Ambulatory Visit: Payer: Self-pay

## 2018-03-19 DIAGNOSIS — I83893 Varicose veins of bilateral lower extremities with other complications: Secondary | ICD-10-CM

## 2018-03-31 DIAGNOSIS — D485 Neoplasm of uncertain behavior of skin: Secondary | ICD-10-CM | POA: Diagnosis not present

## 2018-03-31 DIAGNOSIS — L905 Scar conditions and fibrosis of skin: Secondary | ICD-10-CM | POA: Diagnosis not present

## 2018-03-31 DIAGNOSIS — D2239 Melanocytic nevi of other parts of face: Secondary | ICD-10-CM | POA: Diagnosis not present

## 2018-03-31 DIAGNOSIS — L719 Rosacea, unspecified: Secondary | ICD-10-CM | POA: Diagnosis not present

## 2018-03-31 DIAGNOSIS — L219 Seborrheic dermatitis, unspecified: Secondary | ICD-10-CM | POA: Diagnosis not present

## 2018-04-02 ENCOUNTER — Ambulatory Visit (HOSPITAL_COMMUNITY)
Admission: RE | Admit: 2018-04-02 | Discharge: 2018-04-02 | Disposition: A | Discharge status: Home / Self Care | Payer: Medicare Other | Source: Ambulatory Visit | Attending: Vascular Surgery | Admitting: Vascular Surgery

## 2018-04-02 ENCOUNTER — Other Ambulatory Visit: Payer: Self-pay | Admitting: *Deleted

## 2018-04-02 ENCOUNTER — Ambulatory Visit (INDEPENDENT_AMBULATORY_CARE_PROVIDER_SITE_OTHER): Payer: Medicare Other | Admitting: Vascular Surgery

## 2018-04-02 ENCOUNTER — Other Ambulatory Visit: Payer: Self-pay

## 2018-04-02 ENCOUNTER — Encounter: Payer: Self-pay | Admitting: Vascular Surgery

## 2018-04-02 VITALS — BP 153/77 | HR 58 | Temp 97.0°F | Resp 18 | Ht 67.5 in | Wt 207.0 lb

## 2018-04-02 DIAGNOSIS — I83893 Varicose veins of bilateral lower extremities with other complications: Secondary | ICD-10-CM | POA: Diagnosis not present

## 2018-04-02 DIAGNOSIS — I83813 Varicose veins of bilateral lower extremities with pain: Secondary | ICD-10-CM

## 2018-04-02 NOTE — Progress Notes (Signed)
Patient is a 67 year old female previously seen by my partner Dr. Kellie Simmering June 2019.  At that time she was complaining of hypersensitivity around spider veins in her ankles.  She also had large bulging varicosities right greater than left medial thigh and calf.  She denies any prior history of DVT or bleeding.  She has been compliant wearing her compression stockings and states that these do improve some of her symptoms but they are difficult to wear on hot days.  She states her symptoms also do get better with elevation.  Review of systems: She denies shortness of breath.  She denies chest pain.  Past Medical History:  Diagnosis Date  . Anemia   . Anxiety   . Breast cancer ([place])   . Depression   . Diabetes mellitus without complication ([place])   . High cholesterol   . Hypertension    Past Surgical History:  Procedure Laterality Date  . BREAST LUMPECTOMY Left    states for ductal carcinoma, had surgery in late may  . COLONOSCOPY    . galbladder    . GANGLION CYST EXCISION    . TONSILLECTOMY AND ADENOIDECTOMY     Current Outpatient Medications on File Prior to Visit  Medication Sig Dispense Refill  . ALPRAZolam (XANAX) 0.25 MG tablet Take 0.25 mg by mouth 2 (two) times daily.    Marland Kitchen BIOTIN EXTRA STRENGTH PO Take by mouth daily.    . cetirizine (ZYRTEC) 10 MG tablet Take 10 mg by mouth daily.    . citalopram (CELEXA) 20 MG tablet Take 20 mg by mouth daily.    . diphenoxylate-atropine (LOMOTIL) 2.5-0.025 MG tablet May take 1-2 tabs four times a day prn loose stools. (Patient taking differently: 2 (two) times daily. May take 1-2 tabs four times a day prn loose stools.) 60 tablet 1  . doxycycline (ADOXA) 50 MG tablet Take 50 mg by mouth daily.    . furosemide (LASIX) 20 MG tablet Take 1 tablet (20 mg total) by mouth daily. 90 tablet 3  . glipiZIDE (GLUCOTROL) 10 MG tablet Take 10 mg by mouth 2 (two) times daily before a meal.    . hydrocortisone cream 1 % Apply 1 application topically daily.     . insulin glargine (LANTUS) 100 UNIT/ML injection Inject 25 Units into the skin at bedtime.     Javier Docker Oil 500 MG CAPS Take 500 mg by mouth. 1,500 mg BID    . lisinopril (PRINIVIL,ZESTRIL) 5 MG tablet Take 5 mg by mouth daily.    . metFORMIN (GLUCOPHAGE) 1000 MG tablet Take 1,000 mg by mouth 2 (two) times daily with a meal.    . potassium chloride SA (K-DUR,KLOR-CON) 20 MEQ tablet Take 1 tablet (20 mEq total) by mouth daily. 90 tablet 3  . sitaGLIPtin (JANUVIA) 100 MG tablet Take 100 mg by mouth daily.    Marland Kitchen venlafaxine (EFFEXOR) 75 MG tablet Take 75 mg by mouth 2 (two) times daily.    Marland Kitchen loperamide (IMODIUM A-D) 2 MG tablet Take 4 mg by mouth 2 (two) times daily.     No current facility-administered medications on file prior to visit.    Physical exam:  Vitals:   04/02/18 1412 04/02/18 1416  BP: (!) 156/73 (!) 153/77  Pulse: (!) 58   Resp: 18   Temp: (!) 97 F (36.1 C)   TempSrc: Oral   SpO2: 100%   Weight: 207 lb (93.9 kg)   Height: 5' 7.5" (1.715 m)     Extremities:  Edema knee to foot bilaterally scattered spider type varicosities around the ankles bilaterally palpable greater saphenous vein  Data: I used the SonoSite today to examine her leg as the greater saphenous on duplex today had a deeper subfascial branch.  The vein did have a straight line channel all the way up to the saphenofemoral junction.  The patient had a formal duplex ultrasound for reflux today which showed reflux throughout the right greater saphenous vein with a deeper branch.  Right greater saphenous vein was 5 to 6 mm.  Left greater saphenous vein was also showing diffuse reflux.  It was also 5 to 6 mm diameter.  She did have an element of common femoral vein reflux on the left.  Assessment: Symptomatic varicose veins with pain and swelling bilateral lower extremities.  Plan: We will try to get this patient scheduled for laser ablation right leg followed by the left as well as sclerotherapy of her spider  veins in the near future.  Ruta Hinds, MD [facility] Office: [phone] Pager: [phone]

## 2018-04-15 ENCOUNTER — Ambulatory Visit (INDEPENDENT_AMBULATORY_CARE_PROVIDER_SITE_OTHER): Payer: Medicare Other | Admitting: Vascular Surgery

## 2018-04-15 ENCOUNTER — Encounter: Payer: Self-pay | Admitting: Vascular Surgery

## 2018-04-15 VITALS — BP 127/67 | HR 63 | Temp 96.8°F | Resp 18 | Ht 68.0 in | Wt 203.0 lb

## 2018-04-15 DIAGNOSIS — I83811 Varicose veins of right lower extremities with pain: Secondary | ICD-10-CM | POA: Diagnosis not present

## 2018-04-15 HISTORY — PX: ENDOVENOUS ABLATION SAPHENOUS VEIN W/ LASER: SUR449

## 2018-04-15 NOTE — Progress Notes (Signed)
     Laser Ablation Procedure    Date: 04/15/2018   Taylor Williams DOB:Oct 27, 1950  Consent signed: Yes    Surgeon:  Dr. Ruta Hinds  Procedure: Laser Ablation: right Greater Saphenous Vein  BP 127/67 (BP Location: Left Arm, Patient Position: Sitting, Cuff Size: Normal)   Pulse 63   Temp (!) 96.8 F (36 C) (Oral)   Resp 18   Ht 5' 8"  (1.727 m)   Wt 203 lb (92.1 kg)   SpO2 100%   BMI 30.87 kg/m   Tumescent Anesthesia: 425  cc 0.9% NaCl with 50 cc Lidocaine HCL 1% and 15 cc 8.4% NaHCO3  Local Anesthesia: 7 cc Lidocaine HCL and NaHCO3 (ratio 2:1)  15 watts continuous mode        Total energy: 2015 Joule   Total time: 2:13    Patient tolerated procedure well   Description of Procedure:  After marking the course of the secondary varicosities, the patient was placed on the operating table in the supine position, and the right leg was prepped and draped in sterile fashion.   Local anesthetic was administered and under ultrasound guidance the saphenous vein was accessed with a micro needle and guide wire; then the mirco puncture sheath was placed.  A guide wire was inserted saphenofemoral junction , followed by a 5 french sheath.  The position of the sheath and then the laser fiber below the junction was confirmed using the ultrasound.  Tumescent anesthesia was administered along the course of the saphenous vein using ultrasound guidance. The patient was placed in Trendelenburg position and protective laser glasses were placed on patient and staff, and the laser was fired at 15 watts continuous mode advancing 1-77m/second for a total of 2015 joules.     Steri strip was applied to the IV insertion site and ABD pads and thigh high compression stockings were applied.  Ace wrap bandages were applied over the right thigh and at the top of the saphenofemoral junction. Blood loss was less than 15 cc.  The patient ambulated out of the operating room having tolerated the procedure  well.  CRuta Hinds MD [facility] GE[place]etOffice: [phone]: [phone]

## 2018-04-22 ENCOUNTER — Ambulatory Visit (INDEPENDENT_AMBULATORY_CARE_PROVIDER_SITE_OTHER): Payer: Medicare Other | Admitting: Vascular Surgery

## 2018-04-22 ENCOUNTER — Other Ambulatory Visit: Payer: Self-pay

## 2018-04-22 ENCOUNTER — Encounter: Payer: Self-pay | Admitting: Vascular Surgery

## 2018-04-22 ENCOUNTER — Ambulatory Visit (HOSPITAL_COMMUNITY)
Admission: RE | Admit: 2018-04-22 | Discharge: 2018-04-22 | Disposition: A | Discharge status: Home / Self Care | Payer: Medicare Other | Source: Ambulatory Visit | Attending: Vascular Surgery | Admitting: Vascular Surgery

## 2018-04-22 VITALS — BP 125/74 | HR 65 | Resp 19 | Ht 68.0 in | Wt 203.6 lb

## 2018-04-22 DIAGNOSIS — I83813 Varicose veins of bilateral lower extremities with pain: Secondary | ICD-10-CM | POA: Diagnosis not present

## 2018-04-22 DIAGNOSIS — Z9889 Other specified postprocedural states: Secondary | ICD-10-CM | POA: Diagnosis not present

## 2018-04-22 NOTE — Progress Notes (Signed)
Patient is a 67 year old female who returns for follow-up today.  She recently underwent laser ablation of her right greater saphenous vein April 15, 2018.  She reports still some soreness in the right inner thigh but overall is doing well.  She has noticed some decompression of the veins in her right leg.  Physical exam:  Vitals:   04/22/18 1026  BP: 125/74  Pulse: 65  Resp: 19  SpO2: 100%  Weight: 203 lb 9.6 oz (92.4 kg)  Height: 5' 8"  (1.727 m)    Extremities: Some ecchymosis right medial aspect of the leg veins in the calf have decompressed considerably there is still some spider type varicosities diffusely in both legs  Data: Patient had a duplex ultrasound today which confirms the right greater saphenous vein has been successfully closed.  Assessment: Patient with known bilateral lower extremity varicose veins with pain and swelling.  Right leg is doing well with successful laser ablation of the right greater saphenous vein.  Plan: The patient is scheduled for laser ablation of the left greater saphenous vein in the middle of October.  We will also look into whether or not she would be a candidate for sclerotherapy in both legs.  She will continue to wear her compression stockings.  Ruta Hinds, MD [facility] [place] Office: [phone] Pager: [phone]

## 2018-04-29 DIAGNOSIS — I10 Essential (primary) hypertension: Secondary | ICD-10-CM | POA: Diagnosis not present

## 2018-04-29 DIAGNOSIS — Z1389 Encounter for screening for other disorder: Secondary | ICD-10-CM | POA: Diagnosis not present

## 2018-04-29 DIAGNOSIS — F411 Generalized anxiety disorder: Secondary | ICD-10-CM | POA: Diagnosis not present

## 2018-04-29 DIAGNOSIS — E1165 Type 2 diabetes mellitus with hyperglycemia: Secondary | ICD-10-CM | POA: Diagnosis not present

## 2018-04-29 DIAGNOSIS — Z23 Encounter for immunization: Secondary | ICD-10-CM | POA: Diagnosis not present

## 2018-04-29 DIAGNOSIS — F3289 Other specified depressive episodes: Secondary | ICD-10-CM | POA: Diagnosis not present

## 2018-04-29 DIAGNOSIS — Z0000 Encounter for general adult medical examination without abnormal findings: Secondary | ICD-10-CM | POA: Diagnosis not present

## 2018-05-13 ENCOUNTER — Encounter: Payer: Self-pay | Admitting: Vascular Surgery

## 2018-05-13 ENCOUNTER — Other Ambulatory Visit: Payer: Self-pay

## 2018-05-13 ENCOUNTER — Ambulatory Visit (INDEPENDENT_AMBULATORY_CARE_PROVIDER_SITE_OTHER): Payer: Medicare Other | Admitting: Vascular Surgery

## 2018-05-13 VITALS — BP 122/71 | HR 69 | Temp 97.3°F | Resp 18 | Ht 68.0 in | Wt 205.9 lb

## 2018-05-13 DIAGNOSIS — I83812 Varicose veins of left lower extremities with pain: Secondary | ICD-10-CM | POA: Diagnosis not present

## 2018-05-13 HISTORY — PX: ENDOVENOUS ABLATION SAPHENOUS VEIN W/ LASER: SUR449

## 2018-05-13 NOTE — Progress Notes (Signed)
     Laser Ablation Procedure    Date: 05/13/2018   Taylor Williams DOB:1951/01/29  Consent signed: Yes    Surgeon:  Dr. Ruta Hinds   Procedure: Laser Ablation: left Greater Saphenous Vein  BP 122/71 (BP Location: Right Arm, Patient Position: Sitting, Cuff Size: Normal)   Pulse 69   Temp (!) 97.3 F (36.3 C) (Oral)   Resp 18   Ht 5' 8"  (1.727 m)   Wt 205 lb 14.4 oz (93.4 kg)   SpO2 100%   BMI 31.31 kg/m   Tumescent Anesthesia: 500 cc 0.9% NaCl with 50 cc Lidocaine HCL 1% and 15 cc 8.4% NaHCO3  Local Anesthesia: 12 cc Lidocaine HCL and NaHCO3 (ratio 2:1)  Laser mode Continuous Watts 15 Watts  Total Energy 2496 Joules Total Time 2:44     Patient tolerated procedure well    Description of Procedure:  After marking the course of the secondary varicosities, the patient was placed on the operating table in the supine position, and the left leg was prepped and draped in sterile fashion.   Local anesthetic was administered and under ultrasound guidance the saphenous vein was accessed with a micro needle and guide wire; then the mirco puncture sheath was placed.  A guide wire was inserted saphenofemoral junction , followed by a 5 french sheath.  The position of the sheath and then the laser fiber below the junction was confirmed using the ultrasound.  Tumescent anesthesia was administered along the course of the saphenous vein using ultrasound guidance. The patient was placed in Trendelenburg position and protective laser glasses were placed on patient and staff, and the laser was fired at 15 watts continuous mode advancing 1-28m/second for a total of 2496 joules.    Steri strip was applied to the IV insertion site and ABD pads and thigh high compression stockings were applied.  Ace wrap bandages were applied over the left calf and thigh and at the top of the saphenofemoral junction. Blood loss was less than 15 cc.  The patient ambulated out of the operating room having tolerated  the procedure well.  CRuta Hinds MD [facility] [place]: [phone]: [phone]

## 2018-05-20 ENCOUNTER — Ambulatory Visit (HOSPITAL_COMMUNITY)
Admission: RE | Admit: 2018-05-20 | Discharge: 2018-05-20 | Disposition: A | Discharge status: Home / Self Care | Payer: Medicare Other | Source: Ambulatory Visit | Attending: Vascular Surgery | Admitting: Vascular Surgery

## 2018-05-20 ENCOUNTER — Encounter: Payer: Self-pay | Admitting: Vascular Surgery

## 2018-05-20 ENCOUNTER — Ambulatory Visit (INDEPENDENT_AMBULATORY_CARE_PROVIDER_SITE_OTHER): Payer: Medicare Other | Admitting: Vascular Surgery

## 2018-05-20 VITALS — BP 124/71 | HR 66 | Temp 96.9°F | Resp 16 | Ht 68.0 in | Wt 206.0 lb

## 2018-05-20 DIAGNOSIS — I83813 Varicose veins of bilateral lower extremities with pain: Secondary | ICD-10-CM

## 2018-05-20 NOTE — Progress Notes (Signed)
Patient is a 67 year old female who returns for follow-up today.  She has previously undergone laser ablation of the right greater saphenous vein several weeks ago.  She had laser ablation of the left greater saphenous vein last week.  With this procedure she had more soreness in the left leg but this is improving.  She states that the aching and swelling symptoms in the right leg have already started to improve after her laser ablation.  She is scheduled to see Thea Silversmith for sclerotherapy in November.  Physical exam:  Vitals:   05/20/18 1103  BP: 124/71  Pulse: 66  Resp: 16  Temp: (!) 96.9 F (36.1 C)  TempSrc: Oral  SpO2: 99%  Weight: 206 lb (93.4 kg)  Height: 5' 8"  (1.727 m)    Extremities: Right lower extremity minimal edema area varicosity palpable in the right medial thigh cluster spider varicosities around the right ankle, left lower extremity ecchymosis over the area of the greater saphenous vein diffusely from the groin down to the knee mild tenderness no fluctuance no erythema  Data: Duplex ultrasound shows successful closure of the left greater saphenous vein within a centimeter and a half of the saphenofemoral junction.  Assessment: Doing well status post laser ablation of right followed by sequential left greater saphenous vein.  Patient was reassured regarding the achiness in her left thigh that this should improve over the next few weeks.  Plan: The patient will follow-up with Thea Silversmith in November for sclerotherapy.  She will follow-up with me otherwise on an as-needed basis.  Ruta Hinds, MD [facility] [place] Office: [phone] Pager: [phone]

## 2018-06-16 ENCOUNTER — Other Ambulatory Visit (HOSPITAL_COMMUNITY): Payer: Self-pay | Admitting: Internal Medicine

## 2018-06-16 DIAGNOSIS — C50412 Malignant neoplasm of upper-outer quadrant of left female breast: Secondary | ICD-10-CM

## 2018-06-18 ENCOUNTER — Encounter: Payer: Self-pay | Admitting: *Deleted

## 2018-06-18 ENCOUNTER — Ambulatory Visit: Payer: Medicare Other | Admitting: *Deleted

## 2018-06-18 ENCOUNTER — Ambulatory Visit (INDEPENDENT_AMBULATORY_CARE_PROVIDER_SITE_OTHER): Payer: Medicare Other | Admitting: *Deleted

## 2018-06-18 DIAGNOSIS — I868 Varicose veins of other specified sites: Secondary | ICD-10-CM

## 2018-06-18 DIAGNOSIS — I83813 Varicose veins of bilateral lower extremities with pain: Secondary | ICD-10-CM

## 2018-06-18 NOTE — Progress Notes (Signed)
X=.3% Sotradecol administered with a 27g butterfly.  Patient received a total of 12cc.  Treated mostly spiders and a few retics. Easy access. Tol well. Anticipate good results. Follow this nice lady prn.  Photos: Yes.    Compression stockings applied: Yes.

## 2018-06-23 ENCOUNTER — Ambulatory Visit (HOSPITAL_COMMUNITY): Admission: RE | Admit: 2018-06-23 | Payer: Medicare Other | Source: Ambulatory Visit

## 2018-06-23 ENCOUNTER — Ambulatory Visit (HOSPITAL_COMMUNITY)
Admission: RE | Admit: 2018-06-23 | Discharge: 2018-06-23 | Disposition: A | Discharge status: Home / Self Care | Payer: Medicare Other | Source: Ambulatory Visit | Attending: Internal Medicine | Admitting: Internal Medicine

## 2018-06-23 DIAGNOSIS — R921 Mammographic calcification found on diagnostic imaging of breast: Secondary | ICD-10-CM | POA: Diagnosis not present

## 2018-06-23 DIAGNOSIS — C50412 Malignant neoplasm of upper-outer quadrant of left female breast: Secondary | ICD-10-CM | POA: Diagnosis not present

## 2018-06-30 ENCOUNTER — Inpatient Hospital Stay (HOSPITAL_COMMUNITY): Payer: Medicare Other | Attending: Hematology | Admitting: Internal Medicine

## 2018-06-30 ENCOUNTER — Other Ambulatory Visit: Payer: Self-pay

## 2018-06-30 ENCOUNTER — Encounter (HOSPITAL_COMMUNITY): Payer: Self-pay | Admitting: Internal Medicine

## 2018-06-30 VITALS — BP 129/60 | HR 67 | Temp 97.7°F | Resp 14 | Wt 202.6 lb

## 2018-06-30 DIAGNOSIS — R918 Other nonspecific abnormal finding of lung field: Secondary | ICD-10-CM | POA: Diagnosis not present

## 2018-06-30 DIAGNOSIS — I10 Essential (primary) hypertension: Secondary | ICD-10-CM | POA: Diagnosis not present

## 2018-06-30 DIAGNOSIS — Z853 Personal history of malignant neoplasm of breast: Secondary | ICD-10-CM | POA: Diagnosis not present

## 2018-06-30 DIAGNOSIS — C50412 Malignant neoplasm of upper-outer quadrant of left female breast: Secondary | ICD-10-CM

## 2018-06-30 NOTE — Patient Instructions (Signed)
[facility]  Discharge Instructions: You saw Dr. Higgs today                               _______________________________________________________________  Thank you for choosing [facility] at [facility] to provide your oncology and hematology care.  To afford each patient quality time with our providers, please arrive at least 15 minutes before your scheduled appointment.  You need to re-schedule your appointment if you arrive 10 or more minutes late.  We strive to give you quality time with our providers, and arriving late affects you and other patients whose appointments are after yours.  Also, if you no show three or more times for appointments you may be dismissed from the clinic.  Again, thank you for choosing [facility]. Our hope is that these requests will allow you access to exceptional care and in a timely manner. _______________________________________________________________  If you have questions after your visit, please contact our office at [phone] between the hours of 8:30 a.m. and 5:00 p.m. Voicemails left after 4:30 p.m. will not be returned until the following business day. _______________________________________________________________  For prescription refill requests, have your pharmacy contact our office. _______________________________________________________________  Recommendations made by the consultant and any test results will be sent to your referring physician. _______________________________________________________________ 

## 2018-06-30 NOTE — Progress Notes (Signed)
Diagnosis Carcinoma of upper-outer quadrant of left female breast, unspecified estrogen receptor status ([place]) - Plan: MM Digital Diagnostic Bilat, CBC with Differential/Platelet, Comprehensive metabolic panel, Lactate dehydrogenase  Abnormal findings on diagnostic imaging of lung - Plan: MM Digital Diagnostic Bilat, CT Chest Wo Contrast, CBC with Differential/Platelet, Comprehensive metabolic panel, Lactate dehydrogenase  Staging Cancer Staging Carcinoma of upper-outer quadrant of left female breast [facility]) Staging form: Breast, AJCC 7th Edition - Pathologic stage from 11/30/2015: Stage IA (T1b, N0, cM0) - Signed by Baird Cancer, PA-C on 01/19/2016   Assessment and Plan:  1. Stage I ER- PR- HER 2 neu - carcinoma of the L breast.  Pt is s/p Lumpectomy and sentinel LN biopsy with Dr. Ladona Horns on 11/30/2015 s/p 4 cycles of TC chemotherapy.  Pt had bilateral diagnostic mammogram done 12/16/2017 that showed  1. Probably benign left lumpectomy bed calcifications felt to represent early fat necrosis. Recommendation is for six-month mammographic follow-up with spot magnification views. 2. No mammographic evidence of malignancy on the right.  Pt had left breast diagnostic mammogram done 06/23/2018 that was reviewed and showed IMPRESSION: 1. No evidence of recurrent breast malignancy. 2. Benign, fat necrosis calcifications lumpectomy bed of the left breast.  RECOMMENDATION: Diagnostic mammographies in May 2020 standard post lumpectomy protocol.   Pt is set up for bilateral diagnostic mammogram in 11/2018 and will follow-up to go over results.    2. RUL pulmonary nodule.   CT lung cancer screening protocol from July 2018 showed the spiculated nodule is stable in size.    Pt had CT chest without contrast done 02/10/2018 that was reviewed and showed IMPRESSION: Stable 9 mm right upper lobe pulmonary nodule, likely benign in etiology. Continued follow-up by chest CT in 1 year.  Hepatic  cirrhosis and pulmonary venous hypertension.  Aortic Atherosclerosis (ICD10-I70.0).   Pt is set up for CT chest without contrast in 01/2019 and will follow-up at that time to go over results.    3.  ? Cirrhosis.  This was noted on lung screening CT done 02/10/2018.  Labs done 11/2017 showed AST of 45 and normal ALT. PT will be referred to GI for evaluation and colonoscopy.    4.  Varicosities.  Pt had large varicose veins noted in bilateral LE.  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  She was referred to vascular for evaluation.  Pt has undergone procedures and reports legs are improved.  Follow-up with vascular as recommended.    5  Smoking.  Cessation is recommended.  Pt reports minor cough which she feels is smokers cough.  If no improvement, notify the office.  CT chest done 02/10/2018 showed stable 9 mm nodule.  Pt is set up for repeat imaging in 01/2019 and will follow-up at that time to go over results.   6.  HTN.  BP is 129/60.  Follow-up with PCP.    7.  Health maintenance.  Continue colonoscopy and bone density evaluation as recommended by GI and PCP.    25 minutes spent with more than 50% spent in counseling and coordination of care.    Current Status:  Pt is seen today for follow-up.  She had vascular procedures and feels legs are improved.      Carcinoma of upper-outer quadrant of left female breast ([place])   11/30/2015 Surgery    Lumpectomy and sentinel node procedure with Dr. Ladona Horns    11/30/2015 Surgery    Needle localization of the L breast with mammo  guidance    11/30/2015 Pathology Results    L breast invasive ductal carcinoma, mod diff, 0.8 cm, focal DCIS, high grade, 2/2 LN negative, no LVI, pT1b, pN0M0, ER- PR- HER2 - Ki-67 92%    01/12/2016 - 03/15/2016 Chemotherapy    TC x 4 cycles    02/01/2016 Imaging    CT chest screening- Lung-RADS Category 4B, suspicious. Additional imaging evaluation or consultation with pulmonary medicine or thoracic surgery recommended.  Bilateral pulmonary nodules, including an inferior right upper lobe spiculated 11 mm nodule.     03/13/2016 Procedure    Port removed by Dr. Ladona Horns due to concerns for infection.    03/14/2016 Procedure    PICC line placed    04/03/2016 PET scan    Majority of visualized pulmonary nodules are too small for PET resolution. Largest nodule in the right upper lobe is likely stable and does not show metabolism above blood pool. Follow-up CT chest without contrast in 3 months is recommended in further evaluation, to document stability, as malignancy cannot be excluded.Hypermetabolic subcutaneous nodule along the medial right shoulder. This the should be amenable to direct Visualization/palpation. Mild hypermetabolism associated with the left breast and left axilla, likely postoperative in etiology. Please correlate clinically. Aortic atherosclerosis and coronary artery calcification.Cirrhosis.      Problem List Patient Active Problem List   Diagnosis Date Noted  . Varicose veins of bilateral lower extremities with other complications [H88.502] 77/41/2878  . Solitary pulmonary nodule [R91.1] 07/25/2016  . High blood sugar [R73.9] 02/02/2016  . Diarrhea [R19.7] 01/19/2016  . Carcinoma of upper-outer quadrant of left female breast [facility]) [C50.412] 01/01/2016    Past Medical History Past Medical History:  Diagnosis Date  . Anemia   . Anxiety   . Breast cancer ([place])   . Depression   . Diabetes mellitus without complication ([place])   . High cholesterol   . Hypertension     Past Surgical History Past Surgical History:  Procedure Laterality Date  . BREAST LUMPECTOMY Left    states for ductal carcinoma, had surgery in late may  . COLONOSCOPY    . ENDOVENOUS ABLATION SAPHENOUS VEIN W/ LASER Right 04/15/2018   endovenous laser ablation R GSV by Ruta Hinds MD   . ENDOVENOUS ABLATION SAPHENOUS VEIN W/ LASER Left 05/13/2018   endovenous laser ablation left greater saphenous vein by Ruta Hinds MD   . galbladder    . GANGLION CYST EXCISION    . TONSILLECTOMY AND ADENOIDECTOMY      Family History Family History  Problem Relation Age of Onset  . Cancer - Other Mother   . Hypertension Father   . Diabetes Father   . Dementia Father   . Hyperthyroidism Brother   . Cancer Paternal Grandmother   . Cancer Paternal Grandfather      Social History  reports that she has been smoking. She started smoking about 52 years ago. She has a 40.00 pack-year smoking history. She has never used smokeless tobacco. She reports that she does not drink alcohol or use drugs.  Medications  Current Outpatient Medications:  .  ALPRAZolam (XANAX) 0.25 MG tablet, Take 0.25 mg by mouth 2 (two) times daily., Disp: , Rfl:  .  cetirizine (ZYRTEC) 10 MG tablet, Take 10 mg by mouth daily., Disp: , Rfl:  .  citalopram (CELEXA) 20 MG tablet, Take 20 mg by mouth daily., Disp: , Rfl:  .  dapagliflozin propanediol (FARXIGA) 5 MG TABS tablet, Take 5 mg by mouth daily., Disp: ,  Rfl:  .  diphenoxylate-atropine (LOMOTIL) 2.5-0.025 MG tablet, May take 1-2 tabs four times a day prn loose stools. (Patient taking differently: 2 (two) times daily. May take 1-2 tabs four times a day prn loose stools.), Disp: 60 tablet, Rfl: 1 .  furosemide (LASIX) 20 MG tablet, Take 1 tablet (20 mg total) by mouth daily., Disp: 90 tablet, Rfl: 3 .  glipiZIDE (GLUCOTROL) 10 MG tablet, Take 10 mg by mouth 2 (two) times daily before a meal., Disp: , Rfl:  .  hydrocortisone cream 1 %, Apply 1 application topically daily., Disp: , Rfl:  .  insulin glargine (LANTUS) 100 UNIT/ML injection, Inject 25 Units into the skin at bedtime. , Disp: , Rfl:  .  Krill Oil 500 MG CAPS, Take 500 mg by mouth. 1,500 mg BID, Disp: , Rfl:  .  lisinopril (PRINIVIL,ZESTRIL) 5 MG tablet, Take 5 mg by mouth daily., Disp: , Rfl:  .  loperamide (IMODIUM A-D) 2 MG tablet, Take 4 mg by mouth 2 (two) times daily., Disp: , Rfl:  .  metFORMIN (GLUCOPHAGE) 1000 MG  tablet, Take 1,000 mg by mouth 2 (two) times daily with a meal., Disp: , Rfl:  .  potassium chloride SA (K-DUR,KLOR-CON) 20 MEQ tablet, Take 1 tablet (20 mEq total) by mouth daily., Disp: 90 tablet, Rfl: 3 .  sitaGLIPtin (JANUVIA) 100 MG tablet, Take 100 mg by mouth daily., Disp: , Rfl:  .  venlafaxine (EFFEXOR) 75 MG tablet, Take 75 mg by mouth 2 (two) times daily., Disp: , Rfl:   Allergies Aspirin; Other; Fish allergy; and Procaine  Review of Systems Review of Systems - Oncology ROS negative other than minor cough   Physical Exam  Vitals Wt Readings from Last 3 Encounters:  06/30/18 202 lb 9.6 oz (91.9 kg)  05/20/18 206 lb (93.4 kg)  05/13/18 205 lb 14.4 oz (93.4 kg)   Temp Readings from Last 3 Encounters:  06/30/18 97.7 F (36.5 C) (Oral)  05/20/18 (!) 96.9 F (36.1 C) (Oral)  05/13/18 (!) 97.3 F (36.3 C) (Oral)   BP Readings from Last 3 Encounters:  06/30/18 129/60  05/20/18 124/71  05/13/18 122/71   Pulse Readings from Last 3 Encounters:  06/30/18 67  05/20/18 66  05/13/18 69   Constitutional: Well-developed, well-nourished, and in no distress.   HENT: Head: Normocephalic and atraumatic.  Mouth/Throat: No oropharyngeal exudate. Mucosa moist. Eyes: Pupils are equal, round, and reactive to light. Conjunctivae are normal. No scleral icterus.  Neck: Normal range of motion. Neck supple. No JVD present.  Cardiovascular: Normal rate, regular rhythm and normal heart sounds.  Exam reveals no gallop and no friction rub.   No murmur heard. Pulmonary/Chest: Effort normal and breath sounds normal. No respiratory distress. No wheezes.No rales.  Abdominal: Soft. Bowel sounds are normal. No distension. There is no tenderness. There is no guarding.  Musculoskeletal: No edema or tenderness.  Lymphadenopathy: No cervical, axillary or supraclavicular adenopathy.  Neurological: Alert and oriented to person, place, and time. No cranial nerve deficit.  Skin: Skin is warm and dry. No  rash noted. No erythema. No pallor.  Psychiatric: Affect and judgment normal.  Breast exam:  Chaperone present.  Left lumpectomy changes noted.  No dominant palpable abnormalities noted bilaterally.    Labs No visits with results within 3 Day(s) from this visit.  Latest known visit with results is:  Appointment on 12/23/2017  Component Date Value Ref Range Status  . WBC 12/23/2017 5.1  4.0 - 10.5 K/uL Final  .  RBC 12/23/2017 4.70  3.87 - 5.11 MIL/uL Final  . Hemoglobin 12/23/2017 14.0  12.0 - 15.0 g/dL Final  . HCT 12/23/2017 42.2  36.0 - 46.0 % Final  . MCV 12/23/2017 89.8  78.0 - 100.0 fL Final  . MCH 12/23/2017 29.8  26.0 - 34.0 pg Final  . MCHC 12/23/2017 33.2  30.0 - 36.0 g/dL Final  . RDW 12/23/2017 15.0  11.5 - 15.5 % Final  . Platelets 12/23/2017 167  150 - 400 K/uL Final  . Neutrophils Relative % 12/23/2017 64  % Final  . Neutro Abs 12/23/2017 3.2  1.7 - 7.7 K/uL Final  . Lymphocytes Relative 12/23/2017 21  % Final  . Lymphs Abs 12/23/2017 1.1  0.7 - 4.0 K/uL Final  . Monocytes Relative 12/23/2017 11  % Final  . Monocytes Absolute 12/23/2017 0.6  0.1 - 1.0 K/uL Final  . Eosinophils Relative 12/23/2017 3  % Final  . Eosinophils Absolute 12/23/2017 0.2  0.0 - 0.7 K/uL Final  . Basophils Relative 12/23/2017 1  % Final  . Basophils Absolute 12/23/2017 0.0  0.0 - 0.1 K/uL Final   Performed at [facility], [address], [place], [place] [address]  . Sodium 12/23/2017 135  135 - 145 mmol/L Final  . Potassium 12/23/2017 4.6  3.5 - 5.1 mmol/L Final  . Chloride 12/23/2017 102  101 - 111 mmol/L Final  . CO2 12/23/2017 25  22 - 32 mmol/L Final  . Glucose, Bld 12/23/2017 147* 65 - 99 mg/dL Final  . BUN 12/23/2017 9  6 - 20 mg/dL Final  . Creatinine, Ser 12/23/2017 0.61  0.44 - 1.00 mg/dL Final  . Calcium 12/23/2017 9.4  8.9 - 10.3 mg/dL Final  . Total Protein 12/23/2017 6.9  6.5 - 8.1 g/dL Final  . Albumin 12/23/2017 3.3* 3.5 - 5.0 g/dL Final  . AST 12/23/2017 45* 15 - 41 U/L  Final  . ALT 12/23/2017 34  14 - 54 U/L Final  . Alkaline Phosphatase 12/23/2017 72  38 - 126 U/L Final  . Total Bilirubin 12/23/2017 0.6  0.3 - 1.2 mg/dL Final  . GFR calc non Af Amer 12/23/2017 >60  >60 mL/min Final  . GFR calc Af Amer 12/23/2017 >60  >60 mL/min Final   Comment: (NOTE) The eGFR has been calculated using the CKD EPI equation. This calculation has not been validated in all clinical situations. eGFR's persistently <60 mL/min signify possible Chronic Kidney Disease.   Georgiann Hahn gap 12/23/2017 8  5 - 15 Final   Performed at [facility], [address] [place], [place] [address]  . LDH 12/23/2017 106  98 - 192 U/L Final   Performed at [facility], [address] [place], [place] [address]  . TSH 12/23/2017 1.057  0.350 - 4.500 uIU/mL Final   Comment: Performed by a 3rd Generation assay with a functional sensitivity of <=0.01 uIU/mL. Performed at [facility], [address], [place], [place] [address]      Pathology Orders Placed This Encounter  Procedures  . MM Digital Diagnostic Bilat    Standing Status:   Future    Standing Expiration Date:   06/30/2019    Order Specific Question:   Reason for Exam (SYMPTOM  OR DIAGNOSIS REQUIRED)    Answer:   left breast cancer    Order Specific Question:   Preferred imaging location?    Answer:   [facility]  . CT Chest Wo Contrast    Standing Status:  Future    Standing Expiration Date:   06/30/2019    Order Specific Question:   Preferred imaging location?    Answer:   [facility]    Order Specific Question:   Radiology Contrast Protocol - do NOT remove file path    Answer:   [email or web address]  . CBC with Differential/Platelet    Standing Status:   Future    Standing Expiration Date:   06/30/2020  . Comprehensive metabolic panel    Standing Status:   Future    Standing Expiration Date:   06/30/2020  . Lactate dehydrogenase    Standing Status:   Future    Standing Expiration Date:    06/30/2020       Zoila Shutter MD

## 2018-07-16 ENCOUNTER — Ambulatory Visit (INDEPENDENT_AMBULATORY_CARE_PROVIDER_SITE_OTHER): Payer: Medicare Other | Admitting: Internal Medicine

## 2018-08-03 DIAGNOSIS — F3289 Other specified depressive episodes: Secondary | ICD-10-CM | POA: Diagnosis not present

## 2018-08-03 DIAGNOSIS — E1165 Type 2 diabetes mellitus with hyperglycemia: Secondary | ICD-10-CM | POA: Diagnosis not present

## 2018-08-03 DIAGNOSIS — I10 Essential (primary) hypertension: Secondary | ICD-10-CM | POA: Diagnosis not present

## 2018-08-03 DIAGNOSIS — F411 Generalized anxiety disorder: Secondary | ICD-10-CM | POA: Diagnosis not present

## 2018-09-07 DIAGNOSIS — L219 Seborrheic dermatitis, unspecified: Secondary | ICD-10-CM | POA: Diagnosis not present

## 2018-09-07 DIAGNOSIS — L719 Rosacea, unspecified: Secondary | ICD-10-CM | POA: Diagnosis not present

## 2018-11-02 DIAGNOSIS — E1165 Type 2 diabetes mellitus with hyperglycemia: Secondary | ICD-10-CM | POA: Diagnosis not present

## 2018-11-02 DIAGNOSIS — I10 Essential (primary) hypertension: Secondary | ICD-10-CM | POA: Diagnosis not present

## 2018-11-02 DIAGNOSIS — F411 Generalized anxiety disorder: Secondary | ICD-10-CM | POA: Diagnosis not present

## 2018-11-02 DIAGNOSIS — F3289 Other specified depressive episodes: Secondary | ICD-10-CM | POA: Diagnosis not present

## 2019-01-12 ENCOUNTER — Other Ambulatory Visit (HOSPITAL_COMMUNITY): Payer: Self-pay | Admitting: Hematology

## 2019-01-12 DIAGNOSIS — C50412 Malignant neoplasm of upper-outer quadrant of left female breast: Secondary | ICD-10-CM

## 2019-01-12 DIAGNOSIS — R918 Other nonspecific abnormal finding of lung field: Secondary | ICD-10-CM

## 2019-01-19 ENCOUNTER — Other Ambulatory Visit: Payer: Self-pay

## 2019-01-19 ENCOUNTER — Ambulatory Visit (HOSPITAL_COMMUNITY): Payer: Medicare Other

## 2019-01-19 ENCOUNTER — Ambulatory Visit (HOSPITAL_COMMUNITY): Admission: RE | Admit: 2019-01-19 | Payer: Medicare Other | Source: Ambulatory Visit

## 2019-01-19 ENCOUNTER — Ambulatory Visit (HOSPITAL_COMMUNITY)
Admission: RE | Admit: 2019-01-19 | Discharge: 2019-01-19 | Disposition: A | Discharge status: Home / Self Care | Payer: Medicare Other | Source: Ambulatory Visit | Attending: Internal Medicine | Admitting: Internal Medicine

## 2019-01-19 DIAGNOSIS — R918 Other nonspecific abnormal finding of lung field: Secondary | ICD-10-CM

## 2019-01-19 DIAGNOSIS — C50412 Malignant neoplasm of upper-outer quadrant of left female breast: Secondary | ICD-10-CM | POA: Diagnosis not present

## 2019-01-19 DIAGNOSIS — R928 Other abnormal and inconclusive findings on diagnostic imaging of breast: Secondary | ICD-10-CM | POA: Diagnosis not present

## 2019-01-27 DIAGNOSIS — F3289 Other specified depressive episodes: Secondary | ICD-10-CM | POA: Diagnosis not present

## 2019-01-27 DIAGNOSIS — F411 Generalized anxiety disorder: Secondary | ICD-10-CM | POA: Diagnosis not present

## 2019-01-27 DIAGNOSIS — E1165 Type 2 diabetes mellitus with hyperglycemia: Secondary | ICD-10-CM | POA: Diagnosis not present

## 2019-01-27 DIAGNOSIS — I10 Essential (primary) hypertension: Secondary | ICD-10-CM | POA: Diagnosis not present

## 2019-02-11 ENCOUNTER — Other Ambulatory Visit (HOSPITAL_COMMUNITY): Payer: Self-pay | Admitting: *Deleted

## 2019-02-11 DIAGNOSIS — C50412 Malignant neoplasm of upper-outer quadrant of left female breast: Secondary | ICD-10-CM

## 2019-02-11 DIAGNOSIS — D6489 Other specified anemias: Secondary | ICD-10-CM

## 2019-02-12 ENCOUNTER — Inpatient Hospital Stay (HOSPITAL_COMMUNITY): Payer: Medicare Other | Attending: Hematology

## 2019-02-12 ENCOUNTER — Ambulatory Visit (HOSPITAL_COMMUNITY)
Admission: RE | Admit: 2019-02-12 | Discharge: 2019-02-12 | Disposition: A | Discharge status: Home / Self Care | Payer: Medicare Other | Source: Ambulatory Visit | Attending: Internal Medicine | Admitting: Internal Medicine

## 2019-02-12 ENCOUNTER — Other Ambulatory Visit: Payer: Self-pay

## 2019-02-12 DIAGNOSIS — D6489 Other specified anemias: Secondary | ICD-10-CM

## 2019-02-12 DIAGNOSIS — J439 Emphysema, unspecified: Secondary | ICD-10-CM | POA: Diagnosis not present

## 2019-02-12 DIAGNOSIS — C50412 Malignant neoplasm of upper-outer quadrant of left female breast: Secondary | ICD-10-CM

## 2019-02-12 DIAGNOSIS — K7460 Unspecified cirrhosis of liver: Secondary | ICD-10-CM | POA: Insufficient documentation

## 2019-02-12 DIAGNOSIS — Z853 Personal history of malignant neoplasm of breast: Secondary | ICD-10-CM | POA: Diagnosis not present

## 2019-02-12 DIAGNOSIS — K766 Portal hypertension: Secondary | ICD-10-CM | POA: Diagnosis not present

## 2019-02-12 DIAGNOSIS — R918 Other nonspecific abnormal finding of lung field: Secondary | ICD-10-CM | POA: Insufficient documentation

## 2019-02-12 LAB — CBC WITH DIFFERENTIAL/PLATELET
Abs Immature Granulocytes: 0.02 10*3/uL (ref 0.00–0.07)
Basophils Absolute: 0.1 10*3/uL (ref 0.0–0.1)
Basophils Relative: 1 %
Eosinophils Absolute: 0.2 10*3/uL (ref 0.0–0.5)
Eosinophils Relative: 3 %
HCT: 41 % (ref 36.0–46.0)
Hemoglobin: 13 g/dL (ref 12.0–15.0)
Immature Granulocytes: 0 %
Lymphocytes Relative: 20 %
Lymphs Abs: 1.1 10*3/uL (ref 0.7–4.0)
MCH: 29 pg (ref 26.0–34.0)
MCHC: 31.7 g/dL (ref 30.0–36.0)
MCV: 91.3 fL (ref 80.0–100.0)
Monocytes Absolute: 0.5 10*3/uL (ref 0.1–1.0)
Monocytes Relative: 10 %
Neutro Abs: 3.5 10*3/uL (ref 1.7–7.7)
Neutrophils Relative %: 66 %
Platelets: 188 10*3/uL (ref 150–400)
RBC: 4.49 MIL/uL (ref 3.87–5.11)
RDW: 15.3 % (ref 11.5–15.5)
WBC: 5.3 10*3/uL (ref 4.0–10.5)
nRBC: 0 % (ref 0.0–0.2)

## 2019-02-12 LAB — COMPREHENSIVE METABOLIC PANEL
ALT: 67 U/L — ABNORMAL HIGH (ref 0–44)
AST: 85 U/L — ABNORMAL HIGH (ref 15–41)
Albumin: 3.2 g/dL — ABNORMAL LOW (ref 3.5–5.0)
Alkaline Phosphatase: 91 U/L (ref 38–126)
Anion gap: 7 (ref 5–15)
BUN: 10 mg/dL (ref 8–23)
CO2: 25 mmol/L (ref 22–32)
Calcium: 9.1 mg/dL (ref 8.9–10.3)
Chloride: 101 mmol/L (ref 98–111)
Creatinine, Ser: 0.55 mg/dL (ref 0.44–1.00)
GFR calc Af Amer: >60 mL/min (ref >60)
GFR calc non Af Amer: >60 mL/min (ref >60)
Glucose, Bld: 180 mg/dL — ABNORMAL HIGH (ref 70–99)
Potassium: 4.9 mmol/L (ref 3.5–5.1)
Sodium: 133 mmol/L — ABNORMAL LOW (ref 135–145)
Total Bilirubin: 0.7 mg/dL (ref 0.3–1.2)
Total Protein: 7.1 g/dL (ref 6.5–8.1)

## 2019-02-12 LAB — LACTATE DEHYDROGENASE: LDH: 115 U/L (ref 98–192)

## 2019-02-12 NOTE — Progress Notes (Signed)
For review.  Please update ordering provider

## 2019-02-15 ENCOUNTER — Encounter (HOSPITAL_COMMUNITY): Payer: Self-pay | Admitting: Hematology

## 2019-02-15 ENCOUNTER — Other Ambulatory Visit: Payer: Self-pay

## 2019-02-15 ENCOUNTER — Inpatient Hospital Stay (HOSPITAL_BASED_OUTPATIENT_CLINIC_OR_DEPARTMENT_OTHER): Payer: Medicare Other | Admitting: Hematology

## 2019-02-15 VITALS — BP 135/61 | HR 79 | Temp 98.0°F | Resp 18 | Wt 213.7 lb

## 2019-02-15 DIAGNOSIS — C50412 Malignant neoplasm of upper-outer quadrant of left female breast: Secondary | ICD-10-CM

## 2019-02-15 DIAGNOSIS — K766 Portal hypertension: Secondary | ICD-10-CM

## 2019-02-15 DIAGNOSIS — R918 Other nonspecific abnormal finding of lung field: Secondary | ICD-10-CM

## 2019-02-15 DIAGNOSIS — K7460 Unspecified cirrhosis of liver: Secondary | ICD-10-CM | POA: Diagnosis not present

## 2019-02-15 DIAGNOSIS — Z853 Personal history of malignant neoplasm of breast: Secondary | ICD-10-CM

## 2019-02-15 NOTE — Patient Instructions (Addendum)
[place] at [facility] Discharge Instructions  You were seen today by Dr. Delton Coombes. He went over your recent lab results. He will see you back in 6 months for labs and follow up.   Thank you for choosing [place] at [facility] to provide your oncology and hematology care.  To afford each patient quality time with our provider, please arrive at least 15 minutes before your scheduled appointment time.   If you have a lab appointment with the [facility] please come in thru the  Main Entrance and check in at the main information desk  You need to re-schedule your appointment should you arrive 10 or more minutes late.  We strive to give you quality time with our providers, and arriving late affects you and other patients whose appointments are after yours.  Also, if you no show three or more times for appointments you may be dismissed from the clinic at the providers discretion.     Again, thank you for choosing [facility].  Our hope is that these requests will decrease the amount of time that you wait before being seen by our physicians.       _____________________________________________________________  Should you have questions after your visit to [facility], please contact our office at [phone] between the hours of 8:00 a.m. and 4:30 p.m.  Voicemails left after 4:00 p.m. will not be returned until the following business day.  For prescription refill requests, have your pharmacy contact our office and allow 72 hours.    Cancer Center Support Programs:   > Cancer Support Group  2nd Tuesday of the month 1pm-2pm, Journey Room

## 2019-02-15 NOTE — Assessment & Plan Note (Addendum)
1.  Stage Ia (T1b N0 M0) triple negative left breast cancer: - Status post left lumpectomy and SLNB by Dr. Ladona Horns on 11/30/2015, 0.8 cm, ER/PR/HER-2 negative, 2 sentinel lymph nodes negative, no LVI, Ki-67 92% - Status post 4 cycles of TC from 01/12/2016 through 03/15/2016, followed by radiation. -Mammogram on 01/19/2019 was BI-RADS Category 2. -Physical examination today shows lumpectomy scar in the left upper outer quadrant is unchanged.  Mild lymphedema in the nipple area present.  No palpable masses adenopathy. - We reviewed her blood work.  We will see her back in 6 months for follow-up.  2.  Lung nodules: - CT of the chest without contrast on 02/12/2019 shows stable right upper lobe nodule measuring 9 x 7 mm.  Stable 5 mm and 4 mm right upper lobe pulmonary nodules.  Scattered mediastinal and hilar lymph nodes are stable. -She is an active smoker, smokes 1 pack/day for the past 48 years. -We will arrange for another CT scan of the chest without contrast in 1 year.  3.  Elevated liver enzymes: -She has advanced cirrhotic changes involving the liver with portal hypertension and portal venous collaterals. -She was surprised and did not know about it.  However the past 2 CT scans also showed cirrhosis. - She does not seem to have any clinical ascites.  No episodes of hepatic encephalopathy.  No hematemesis or hematochezia. - She does not report any hepatitis in the past.  She worked as a Marine scientist in [employer].  She did not have history of excessive alcohol abuse.  Likely etiology is fatty liver. - She will be referred to hepatology clinic for monitoring including imaging.

## 2019-02-15 NOTE — Progress Notes (Signed)
[address] [place], [place] [address]   CLINIC:  Medical Oncology/Hematology  PCP:  [name], MD [place] [phone]   REASON FOR VISIT:  Follow-up for left breast cancer and lung nodules.  CURRENT THERAPY: Observation.  BRIEF ONCOLOGIC HISTORY:  Oncology History  Carcinoma of upper-outer quadrant of left female breast ([place])  11/30/2015 Surgery   Lumpectomy and sentinel node procedure with Dr. Ladona Horns   11/30/2015 Surgery   Needle localization of the L breast with mammo guidance   11/30/2015 Pathology Results   L breast invasive ductal carcinoma, mod diff, 0.8 cm, focal DCIS, high grade, 2/2 LN negative, no LVI, pT1b, pN0M0, ER- PR- HER2 - Ki-67 92%   01/12/2016 - 03/15/2016 Chemotherapy   TC x 4 cycles   02/01/2016 Imaging   CT chest screening- Lung-RADS Category 4B, suspicious. Additional imaging evaluation or consultation with pulmonary medicine or thoracic surgery recommended. Bilateral pulmonary nodules, including an inferior right upper lobe spiculated 11 mm nodule.    03/13/2016 Procedure   Port removed by Dr. Ladona Horns due to concerns for infection.   03/14/2016 Procedure   PICC line placed   04/03/2016 PET scan   Majority of visualized pulmonary nodules are too small for PET resolution. Largest nodule in the right upper lobe is likely stable and does not show metabolism above blood pool. Follow-up CT chest without contrast in 3 months is recommended in further evaluation, to document stability, as malignancy cannot be excluded.Hypermetabolic subcutaneous nodule along the medial right shoulder. This the should be amenable to direct Visualization/palpation. Mild hypermetabolism associated with the left breast and left axilla, likely postoperative in etiology. Please correlate clinically. Aortic atherosclerosis and coronary artery calcification.Cirrhosis.      CANCER STAGING: Cancer Staging Carcinoma of  upper-outer quadrant of left female breast [facility]) Staging form: Breast, AJCC 7th Edition - Pathologic stage from 11/30/2015: Stage IA (T1b, N0, cM0) - Signed by Baird Cancer, PA-C on 01/19/2016    INTERVAL HISTORY:  Taylor Williams 68 y.o. female seen for follow-up of left breast cancer and lung nodules.  Appetite and energy levels are 100%.  No pain reported.  Denies any nausea, vomiting, diarrhea or constipation.  No hematochezia or hematemesis reported.  No abdominal distention or pains.  Denies any ER visits or hospitalizations.  Denies any fevers, night sweats or weight loss in the last 6 months.   REVIEW OF SYSTEMS:  Review of Systems  All other systems reviewed and are negative.    PAST MEDICAL/SURGICAL HISTORY:  Past Medical History:  Diagnosis Date  . Anemia   . Anxiety   . Breast cancer ([place])   . Depression   . Diabetes mellitus without complication ([place])   . High cholesterol   . Hypertension    Past Surgical History:  Procedure Laterality Date  . BREAST LUMPECTOMY Left    states for ductal carcinoma, had surgery in late may  . COLONOSCOPY    . ENDOVENOUS ABLATION SAPHENOUS VEIN W/ LASER Right 04/15/2018   endovenous laser ablation R GSV by Ruta Hinds MD   . ENDOVENOUS ABLATION SAPHENOUS VEIN W/ LASER Left 05/13/2018   endovenous laser ablation left greater saphenous vein by Ruta Hinds MD   . galbladder    . GANGLION CYST EXCISION    . TONSILLECTOMY AND ADENOIDECTOMY       SOCIAL HISTORY:  Social History   Socioeconomic History  . Marital status: Widowed  Spouse name: Not on file  . Number of children: Not on file  . Years of education: Not on file  . Highest education level: Not on file  Occupational History  . Not on file  Social Needs  . Financial resource strain: Not on file  . Food insecurity    Worry: Not on file    Inability: Not on file  . Transportation needs    Medical: Not on file    Non-medical: Not on file  Tobacco Use  .  Smoking status: Current Every Day Smoker    Packs/day: 1.00    Years: 40.00    Pack years: 40.00    Start date: 10/28/1965  . Smokeless tobacco: Never Used  Substance and Sexual Activity  . Alcohol use: No  . Drug use: No  . Sexual activity: Not on file  Lifestyle  . Physical activity    Days per week: Not on file    Minutes per session: Not on file  . Stress: Not on file  Relationships  . Social Herbalist on phone: Not on file    Gets together: Not on file    Attends religious service: Not on file    Active member of club or organization: Not on file    Attends meetings of clubs or organizations: Not on file    Relationship status: Not on file  . Intimate partner violence    Fear of current or ex partner: Not on file    Emotionally abused: Not on file    Physically abused: Not on file    Forced sexual activity: Not on file  Other Topics Concern  . Not on file  Social History Narrative  . Not on file    FAMILY HISTORY:  Family History  Problem Relation Age of Onset  . Cancer - Other Mother   . Hypertension Father   . Diabetes Father   . Dementia Father   . Hyperthyroidism Brother   . Cancer Paternal Grandmother   . Cancer Paternal Grandfather     CURRENT MEDICATIONS:  Outpatient Encounter Medications as of 02/15/2019  Medication Sig  . ALPRAZolam (XANAX) 0.25 MG tablet Take 0.25 mg by mouth 2 (two) times daily.  . cetirizine (ZYRTEC) 10 MG tablet Take 10 mg by mouth daily.  . citalopram (CELEXA) 20 MG tablet Take 20 mg by mouth daily.  . dapagliflozin propanediol (FARXIGA) 5 MG TABS tablet Take 5 mg by mouth daily.  . furosemide (LASIX) 20 MG tablet Take 1 tablet (20 mg total) by mouth daily.  Marland Kitchen glipiZIDE (GLUCOTROL) 10 MG tablet Take 10 mg by mouth 2 (two) times daily before a meal.  . hydrocortisone cream 1 % Apply 1 application topically daily.  . insulin glargine (LANTUS) 100 UNIT/ML injection Inject 35 Units into the skin at bedtime.   Javier Docker Oil  500 MG CAPS Take 500 mg by mouth. 1,500 mg BID  . lisinopril (PRINIVIL,ZESTRIL) 5 MG tablet Take 5 mg by mouth daily.  Marland Kitchen loperamide (IMODIUM A-D) 2 MG tablet Take 4 mg by mouth 2 (two) times daily.  . metFORMIN (GLUCOPHAGE) 1000 MG tablet Take 1,000 mg by mouth 2 (two) times daily with a meal.  . potassium chloride SA (K-DUR,KLOR-CON) 20 MEQ tablet Take 1 tablet (20 mEq total) by mouth daily.  . sitaGLIPtin (JANUVIA) 100 MG tablet Take 100 mg by mouth daily.  Marland Kitchen venlafaxine (EFFEXOR) 75 MG tablet Take 75 mg by mouth 2 (two) times daily.  . [  DISCONTINUED] diphenoxylate-atropine (LOMOTIL) 2.5-0.025 MG tablet May take 1-2 tabs four times a day prn loose stools. (Patient taking differently: 2 (two) times daily. May take 1-2 tabs four times a day prn loose stools.)   No facility-administered encounter medications on file as of 02/15/2019.     ALLERGIES:  Allergies  Allergen Reactions  . Aspirin Swelling    Facial swelling and panic attack when she took pain medication that contained aspirin  . Other Swelling    novacaine - patient states reaction happened as a child and she believes it caused facial swelling, mother always told her the MD said never take again  . Fish Allergy Other (See Comments)    gagging  . Procaine      PHYSICAL EXAM:  ECOG Performance status: 1  Vitals:   02/15/19 1444  BP: 135/61  Pulse: 79  Resp: 18  Temp: 98 F (36.7 C)  SpO2: 100%   Filed Weights   02/15/19 1444  Weight: 213 lb 11.2 oz (96.9 kg)    Physical Exam Vitals signs reviewed.  Constitutional:      Appearance: Normal appearance.  Cardiovascular:     Rate and Rhythm: Normal rate and regular rhythm.     Heart sounds: Normal heart sounds.  Pulmonary:     Effort: Pulmonary effort is normal.     Breath sounds: Normal breath sounds.  Abdominal:     General: There is no distension.     Palpations: Abdomen is soft. There is no mass.  Musculoskeletal:        General: No swelling.  Skin:     General: Skin is warm.  Neurological:     General: No focal deficit present.     Mental Status: She is alert and oriented to person, place, and time.  Psychiatric:        Mood and Affect: Mood normal.        Behavior: Behavior normal.    Breast exam: Left breast upper outer quadrant lumpectomy scar is stable.  There is mild lymphedema in the nipple area.  No palpable masses or adenopathy.  LABORATORY DATA:  I have reviewed the labs as listed.  CBC    Component Value Date/Time   WBC 5.3 02/12/2019 1023   RBC 4.49 02/12/2019 1023   HGB 13.0 02/12/2019 1023   HCT 41.0 02/12/2019 1023   PLT 188 02/12/2019 1023   MCV 91.3 02/12/2019 1023   MCH 29.0 02/12/2019 1023   MCHC 31.7 02/12/2019 1023   RDW 15.3 02/12/2019 1023   LYMPHSABS 1.1 02/12/2019 1023   MONOABS 0.5 02/12/2019 1023   EOSABS 0.2 02/12/2019 1023   BASOSABS 0.1 02/12/2019 1023   CMP Latest Ref Rng & Units 02/12/2019 12/23/2017 11/27/2016  Glucose 70 - 99 mg/dL 180(H) 147(H) 507(HH)  BUN 8 - 23 mg/dL 10 9 7   Creatinine 0.44 - 1.00 mg/dL 0.55 0.61 0.68  Sodium 135 - 145 mmol/L 133(L) 135 127(L)  Potassium 3.5 - 5.1 mmol/L 4.9 4.6 3.9  Chloride 98 - 111 mmol/L 101 102 95(L)  CO2 22 - 32 mmol/L 25 25 25   Calcium 8.9 - 10.3 mg/dL 9.1 9.4 8.9  Total Protein 6.5 - 8.1 g/dL 7.1 6.9 6.6  Total Bilirubin 0.3 - 1.2 mg/dL 0.7 0.6 0.6  Alkaline Phos 38 - 126 U/L 91 72 109  AST 15 - 41 U/L 85(H) 45(H) 47(H)  ALT 0 - 44 U/L 67(H) 34 27       DIAGNOSTIC IMAGING:  I have  independently reviewed the scans and discussed with the patient.     ASSESSMENT & PLAN:   Carcinoma of upper-outer quadrant of left female breast ([place]) 1.  Stage Ia (T1b N0 M0) triple negative left breast cancer: - Status post left lumpectomy and SLNB by Dr. Ladona Horns on 11/30/2015, 0.8 cm, ER/PR/HER-2 negative, 2 sentinel lymph nodes negative, no LVI, Ki-67 92% - Status post 4 cycles of TC from 01/12/2016 through 03/15/2016, followed by radiation. -Mammogram  on 01/19/2019 was BI-RADS Category 2. -Physical examination today shows lumpectomy scar in the left upper outer quadrant is unchanged.  Mild lymphedema in the nipple area present.  No palpable masses adenopathy. - We reviewed her blood work.  We will see her back in 6 months for follow-up.  2.  Lung nodules: - CT of the chest without contrast on 02/12/2019 shows stable right upper lobe nodule measuring 9 x 7 mm.  Stable 5 mm and 4 mm right upper lobe pulmonary nodules.  Scattered mediastinal and hilar lymph nodes are stable. -She is an active smoker, smokes 1 pack/day for the past 48 years. -We will arrange for another CT scan of the chest without contrast in 1 year.  3.  Elevated liver enzymes: -She has advanced cirrhotic changes involving the liver with portal hypertension and portal venous collaterals. -She was surprised and did not know about it.  However the past 2 CT scans also showed cirrhosis. - She does not seem to have any clinical ascites.  No episodes of hepatic encephalopathy.  No hematemesis or hematochezia. - She does not report any hepatitis in the past.  She worked as a Marine scientist in [employer].  She did not have history of excessive alcohol abuse.  Likely etiology is fatty liver. - She will be referred to hepatology clinic for monitoring including imaging.   Total time spent is 25 minutes with more than 50% of the time spent face-to-face discussing test results, surveillance, counseling and coordination of care.  Orders placed this encounter:  Orders Placed This Encounter  Procedures  . CBC with Differential/Platelet  . Comprehensive metabolic panel      Derek Jack, MD [place] [phone]

## 2019-02-23 ENCOUNTER — Encounter (INDEPENDENT_AMBULATORY_CARE_PROVIDER_SITE_OTHER): Payer: Self-pay | Admitting: *Deleted

## 2019-02-23 ENCOUNTER — Other Ambulatory Visit: Payer: Self-pay

## 2019-02-23 ENCOUNTER — Ambulatory Visit (INDEPENDENT_AMBULATORY_CARE_PROVIDER_SITE_OTHER): Payer: Medicare Other | Admitting: Internal Medicine

## 2019-02-23 ENCOUNTER — Encounter (INDEPENDENT_AMBULATORY_CARE_PROVIDER_SITE_OTHER): Payer: Self-pay | Admitting: Internal Medicine

## 2019-02-23 VITALS — BP 107/60 | HR 75 | Temp 97.7°F | Ht 68.0 in | Wt 212.4 lb

## 2019-02-23 DIAGNOSIS — K7460 Unspecified cirrhosis of liver: Secondary | ICD-10-CM | POA: Diagnosis not present

## 2019-02-23 MED ORDER — SPIRONOLACTONE 25 MG PO TABS: 25.0000 mg | ORAL_TABLET | Freq: Every day | ORAL | 0 refills | Status: DC

## 2019-02-23 NOTE — Progress Notes (Signed)
Subjective:    Patient ID: Taylor Williams, female    DOB: [birth year], 68 y.o.   MRN: [id number]  HPI  Taylor Williams is a 68 y.o. female with a past medical history significant for DM II, HTN, anemia, hyperlipidemia, anxiety, depression and breast cancer stage 1a s/p left breast lumpectomy 11/30/2015 s/p 4 cycles of TC 01/12/2016 - 03/15/2016 and bilateral pulmonary nodules under CT surveillance. She was recently seen by her oncologist, Dr.Sreedhar Delton Coombes, who advised further GI evaluation for cirrhosis seen on prior Pet scan and 2 Chest CT scans since 2017. She denies having any prior evaluation for cirrhosis. She was not aware she had cirrhosis until  Dr. Delton Coombes discussed this with her at her last office visit. She reports having a history of a fatty liver. She underwent a cholecystectomy due to gallstones 10+ years ago. She drank heavily during her teen years otherwise drinks 2 to 3 mixed cocktails yearly. No newe medications, however, she recently started taking a supplement for hair/nails and skin. No family history of liver disease. No memory issues or confusion. She has infrequent nausea, no vomiting. Her legs have been intermittently itchy for the past 2 years. Urine is normal yellow color. She is passing a normal formed brown stool daily. No rectal bleeding or melena. She underwent a colonoscopy 2 or 3 years ago by surgeon, Dr. Ladona Horns, in [place]. She stated the colonoscopy was normal, no polyps. She complains of infrequent left flank/side pain that awakens her from sleep, occurs once monthly or less, described ans sharp and grabbing type pain which abates spontaneously in a few minutes. No current abdominal pain. She is a retired Surveyor, minerals.  02/12/2019 Labs: CBC with WBC 5.3. Hg 13. HCT 41. MCV 91.3. PLT 188. Na 133. K 4.9. Glu 180. BUN 10. Cr. 0.55. Albumin 3.2. AST 85. ALT 67. Alk phos 91. T. Bili 0.7.  Comparison Labs 05/31/2016: AST 34. ALT 19.  7/17/202020 Chest CT w/o  contrast:  1. Stable surgical and radiation changes involving the left breast. No findings suspicious for recurrent breast cancer and no axillary or supraclavicular adenopathy. 2. Scattered mediastinal and hilar lymph nodes are stable and likely benign. 3. Stable pulmonary nodules as detailed above. No new or progressive findings. 4. Advanced cirrhotic changes involving the liver with portal venous hypertension and portal venous collaterals.  PET 04/03/2016: 1. Majority of visualized pulmonary nodules are too small for PET resolution. Largest nodule in the right upper lobe is likely stable and does not show metabolism above blood pool. Follow-up CT chest without contrast in 3 months is recommended in further evaluation, to document stability, as malignancy cannot be excluded. 2. Hypermetabolic subcutaneous nodule along the medial right shoulder. This the should be amenable to direct visualization/palpation. 3. Mild hypermetabolism associated with the left breast and left axilla, likely postoperative in etiology. Please correlate clinically. 4. Aortic atherosclerosis and coronary artery calcification. 5. Cirrhosis (No abnormal hypermetabolism in the liver, adrenal glands, spleen or pancreas. No hypermetabolic lymph nodes. Liver margin is irregular. Cholecystectomy. Nodular thickening of the right adrenal gland. Left adrenal glands, kidneys, spleen, pancreas, stomach and bowel are grossly unremarkable. Varices are seen in the abdomen. No free Fluid).  02/10/2018 Chest CT without contrast: Stable 9 mm right upper lobe pulmonary nodule, likely benign in etiology. Continued follow-up by chest CT in 1 year. Hepatic cirrhosis and pulmonary venous hypertension. Aortic Atherosclerosis. Coronary artery calcification.  Past Medical History:  Diagnosis Date  . Anemia   . Anxiety   .  Breast cancer ([place])   . Depression   . Diabetes mellitus without complication ([place])   . High  cholesterol   . Hypertension    Past Surgical History:  Procedure Laterality Date  . BREAST LUMPECTOMY Left    states for ductal carcinoma, had surgery in late may  . COLONOSCOPY    . ENDOVENOUS ABLATION SAPHENOUS VEIN W/ LASER Right 04/15/2018   endovenous laser ablation R GSV by Ruta Hinds MD   . ENDOVENOUS ABLATION SAPHENOUS VEIN W/ LASER Left 05/13/2018   endovenous laser ablation left greater saphenous vein by Ruta Hinds MD   . galbladder    . GANGLION CYST EXCISION    . TONSILLECTOMY AND ADENOIDECTOMY     Current Outpatient Medications on File Prior to Visit  Medication Sig Dispense Refill  . ALPRAZolam (XANAX) 0.25 MG tablet Take 0.25 mg by mouth 2 (two) times daily.    . cetirizine (ZYRTEC) 10 MG tablet Take 10 mg by mouth daily.    . citalopram (CELEXA) 20 MG tablet Take 20 mg by mouth daily.    . dapagliflozin propanediol (FARXIGA) 5 MG TABS tablet Take 5 mg by mouth daily.    . furosemide (LASIX) 20 MG tablet Take 1 tablet (20 mg total) by mouth daily. 90 tablet 3  . glipiZIDE (GLUCOTROL) 10 MG tablet Take 10 mg by mouth 2 (two) times daily before a meal.    . hydrocortisone cream 1 % Apply 1 application topically daily.    . insulin glargine (LANTUS) 100 UNIT/ML injection Inject 35 Units into the skin at bedtime.     Javier Docker Oil 500 MG CAPS Take 500 mg by mouth. 1,500 mg BID    . lisinopril (PRINIVIL,ZESTRIL) 5 MG tablet Take 5 mg by mouth daily.    Marland Kitchen loperamide (IMODIUM A-D) 2 MG tablet Take 4 mg by mouth 2 (two) times daily.    . metFORMIN (GLUCOPHAGE) 1000 MG tablet Take 1,000 mg by mouth 2 (two) times daily with a meal.    . potassium chloride SA (K-DUR,KLOR-CON) 20 MEQ tablet Take 1 tablet (20 mEq total) by mouth daily. 90 tablet 3  . sitaGLIPtin (JANUVIA) 100 MG tablet Take 100 mg by mouth daily.    Marland Kitchen venlafaxine (EFFEXOR) 75 MG tablet Take 75 mg by mouth 2 (two) times daily.     No current facility-administered medications on file prior to visit.     Allergies  Allergen Reactions  . Aspirin Swelling    Facial swelling and panic attack when she took pain medication that contained aspirin  . Other Swelling    novacaine - patient states reaction happened as a child and she believes it caused facial swelling, mother always told her the MD said never take again  . Fish Allergy Other (See Comments)    gagging  . Procaine     Family History  Problem Relation Age of Onset  . Cancer - Other Mother   . Hypertension Father   . Diabetes Father   . Dementia Father   . Hyperthyroidism Brother   . Cancer Paternal Grandmother   . Cancer Paternal Grandfather    Social History   Socioeconomic History  . Marital status: Widowed    Spouse name: Not on file  . Number of children: Not on file  . Years of education: Not on file  . Highest education level: Not on file  Occupational History  . Not on file  Social Needs  . Financial resource strain: Not on  file  . Food insecurity    Worry: Not on file    Inability: Not on file  . Transportation needs    Medical: Not on file    Non-medical: Not on file  Tobacco Use  . Smoking status: Current Every Day Smoker    Packs/day: 1.00    Years: 40.00    Pack years: 40.00    Start date: 10/28/1965  . Smokeless tobacco: Never Used  Substance and Sexual Activity  . Alcohol use: No    Comment: 2 - 3 mixed drink yearly  . Drug use: No  . Sexual activity: Not on file  Lifestyle  . Physical activity    Days per week: Not on file    Minutes per session: Not on file  . Stress: Not on file  Relationships  . Social Herbalist on phone: Not on file    Gets together: Not on file    Attends religious service: Not on file    Active member of club or organization: Not on file    Attends meetings of clubs or organizations: Not on file    Relationship status: Not on file  . Intimate partner violence    Fear of current or ex partner: Not on file    Emotionally abused: Not on file     Physically abused: Not on file    Forced sexual activity: Not on file  Other Topics Concern  . Not on file  Social History Narrative  . Not on file   Review of Systems  Constitutional: No weight loss, fever or sweats. Cardiac: No chest pain or palpitations. LE edema. Respiratory: No cough or SOB. Gastrointestinal: See HPI. No GERD sx. Genitourinary: No dysuria or hematuria.  Musculoskeletal: No new joint pain or muscle aches. Neurological: No headaches, dizziness or memory issues. Psych: History of anxiety and depression, stable.     Objective:   Physical Exam  BP 107/60   Pulse 75   Temp 97.7 F (36.5 C)   Ht 5' 8"  (1.727 m)   Wt 212 lb 6.4 oz (96.3 kg)   BMI 32.30 kg/m  Head: Normocephalic. Eyes: No scleral icterus. Conjunctiva pink. Mouth: Poor dentition, absent upper teeth. HENT: No congestion, hearing intact. Neck: Supply, no thyromegaly or lymphadenopathy. Heart: RRR, S1,S2, no murmurs. Lungs: Clear throughout, no wheezes, rhonchi or crackles. Abdomen: Soft, nondistended, nontender, + BS x 4 quads. No HSM. No ascites. Extremities: 1+ pitting edema pretibial to feet. Neuro: Alert and oriented x 4, no focal deficits. No asterixis.   Assessment & Plan:  1. 68 y.o. female with history of stage 1a breast cancer s/p lumpectomy 11/2015 presents for further evaluation regarding elevated LFTs and compensated cirrhosis of unclear etiology (per chest CT and prior PET scan). Alk phos 91. AST 85. ALT 67. T. Bili 0.7. Normal PLT count.  -check Hepatitis B surface ag, Hep B Core IgG/total, Hep B surface ab, Hep A IgG total. Hepatitis C antibody, ANA, SMA, AMA, iron, GGT, ceruloplasmin, IgG, A1AT and AFP. PT/INR. Check chol/lipid panel. -abdominal sonogram, eventual Abdominal MRI with IV contrast -will need EGD to assess for esophageal varices, will schedule at time of next follow up appointment. -will need vaccinations for Hep A and Hep B if not immune -2gm now sodium diet  2.  Bilateral lower extremity edema without ascites on Lasix 49m po QD and KCL 20 meq po QD. Normal renal function. -added Spironolactone 267mpo QD. Repeat CMP (check NA+ and K+)  in 1 week.  3. Pulmonary nodules. CT of the chest without contrast on 02/12/2019 shows stable right upper lobe nodule measuring 9 x 7 mm.  Stable 5 mm and 4 mm right upper lobe pulmonary nodules.  Scattered mediastinal and hilar lymph nodes are stable. -smoking cessation  -repeat chest CT in 1 year as followed by oncology  Carl Best CRNP

## 2019-02-23 NOTE — Patient Instructions (Addendum)
1. Complete the ordered blood tests in one week.  2. Schedule an abdominal ultrasound  3. Start Spironolactone 36m one tab by mouth to be taken in the morning. Continue taking your Lasix. Please complete the requested  Blood tests in 1 week after starting Spironolactone.  4. 2gm Low sodium diet  5. Follow up in our office in 2 to 3 weeks to review your lab and abdominal ultrasound results.   6. Discussed scheduling an upper endoscopy to assess for esophageal varices at the time of your follow up appointment.

## 2019-03-05 ENCOUNTER — Other Ambulatory Visit: Payer: Self-pay

## 2019-03-05 ENCOUNTER — Ambulatory Visit (HOSPITAL_COMMUNITY)
Admission: RE | Admit: 2019-03-05 | Discharge: 2019-03-05 | Disposition: A | Discharge status: Home / Self Care | Payer: Medicare Other | Source: Ambulatory Visit | Attending: Internal Medicine | Admitting: Internal Medicine

## 2019-03-05 ENCOUNTER — Other Ambulatory Visit (HOSPITAL_COMMUNITY)
Admission: RE | Admit: 2019-03-05 | Discharge: 2019-03-05 | Disposition: A | Discharge status: Home / Self Care | Payer: Medicare Other | Source: Ambulatory Visit | Attending: Internal Medicine | Admitting: Internal Medicine

## 2019-03-05 DIAGNOSIS — K7460 Unspecified cirrhosis of liver: Secondary | ICD-10-CM | POA: Insufficient documentation

## 2019-03-05 LAB — COMPREHENSIVE METABOLIC PANEL
ALT: 99 U/L — ABNORMAL HIGH (ref 0–44)
AST: 119 U/L — ABNORMAL HIGH (ref 15–41)
Albumin: 3.4 g/dL — ABNORMAL LOW (ref 3.5–5.0)
Alkaline Phosphatase: 99 U/L (ref 38–126)
Anion gap: 10 (ref 5–15)
BUN: 11 mg/dL (ref 8–23)
CO2: 23 mmol/L (ref 22–32)
Calcium: 9.4 mg/dL (ref 8.9–10.3)
Chloride: 103 mmol/L (ref 98–111)
Creatinine, Ser: 0.58 mg/dL (ref 0.44–1.00)
GFR calc Af Amer: >60 mL/min (ref >60)
GFR calc non Af Amer: >60 mL/min (ref >60)
Glucose, Bld: 181 mg/dL — ABNORMAL HIGH (ref 70–99)
Potassium: 3.8 mmol/L (ref 3.5–5.1)
Sodium: 136 mmol/L (ref 135–145)
Total Bilirubin: 0.9 mg/dL (ref 0.3–1.2)
Total Protein: 7.8 g/dL (ref 6.5–8.1)

## 2019-03-05 LAB — LIPID PANEL
Cholesterol: 208 mg/dL — ABNORMAL HIGH (ref 0–200)
HDL: 40 mg/dL — ABNORMAL LOW (ref >40)
LDL Cholesterol: 139 mg/dL — ABNORMAL HIGH (ref 0–99)
Total CHOL/HDL Ratio: 5.2 RATIO
Triglycerides: 144 mg/dL (ref <150)
VLDL: 29 mg/dL (ref 0–40)

## 2019-03-05 LAB — GAMMA GT: GGT: 119 U/L — ABNORMAL HIGH (ref 7–50)

## 2019-03-05 LAB — IRON: Iron: 81 ug/dL (ref 28–170)

## 2019-03-05 LAB — PROTIME-INR
INR: 1 (ref 0.8–1.2)
Prothrombin Time: 13.1 seconds (ref 11.4–15.2)

## 2019-03-05 LAB — FERRITIN: Ferritin: 27 ng/mL (ref 11–307)

## 2019-03-06 LAB — HEPATITIS B SURFACE ANTIBODY, QUANTITATIVE: Hep B S AB Quant (Post): <3.1 m[IU]/mL — ABNORMAL LOW (ref >9.9)

## 2019-03-06 LAB — HEPATITIS B CORE ANTIBODY, TOTAL: Hep B Core Total Ab: NEGATIVE

## 2019-03-06 LAB — HEPATITIS A ANTIBODY, IGM: Hep A IgM: NEGATIVE

## 2019-03-06 LAB — HEPATITIS B E ANTIGEN: Hep B E Ag: NEGATIVE

## 2019-03-06 LAB — CERULOPLASMIN: Ceruloplasmin: 21.7 mg/dL (ref 19.0–39.0)

## 2019-03-06 LAB — HEPATITIS A ANTIBODY, TOTAL: hep A Total Ab: NEGATIVE

## 2019-03-06 LAB — IGG: IgG (Immunoglobin G), Serum: 1739 mg/dL — ABNORMAL HIGH (ref 586–1602)

## 2019-03-06 LAB — HEPATITIS C ANTIBODY: HCV Ab: <0.1 s/co ratio (ref 0.0–0.9)

## 2019-03-06 LAB — MITOCHONDRIAL ANTIBODIES: Mitochondrial M2 Ab, IgG: 42.5 Units — ABNORMAL HIGH (ref 0.0–20.0)

## 2019-03-06 LAB — ALPHA-1-ANTITRYPSIN: A-1 Antitrypsin, Ser: 174 mg/dL (ref 101–187)

## 2019-03-08 DIAGNOSIS — L719 Rosacea, unspecified: Secondary | ICD-10-CM | POA: Diagnosis not present

## 2019-03-08 DIAGNOSIS — L219 Seborrheic dermatitis, unspecified: Secondary | ICD-10-CM | POA: Diagnosis not present

## 2019-03-09 ENCOUNTER — Other Ambulatory Visit: Payer: Self-pay

## 2019-03-09 ENCOUNTER — Ambulatory Visit (INDEPENDENT_AMBULATORY_CARE_PROVIDER_SITE_OTHER): Payer: Medicare Other | Admitting: Nurse Practitioner

## 2019-03-09 ENCOUNTER — Encounter (INDEPENDENT_AMBULATORY_CARE_PROVIDER_SITE_OTHER): Payer: Self-pay | Admitting: Nurse Practitioner

## 2019-03-09 VITALS — BP 122/71 | HR 76 | Temp 97.6°F | Ht 68.0 in | Wt 211.5 lb

## 2019-03-09 DIAGNOSIS — R7989 Other specified abnormal findings of blood chemistry: Secondary | ICD-10-CM

## 2019-03-09 DIAGNOSIS — K7460 Unspecified cirrhosis of liver: Secondary | ICD-10-CM

## 2019-03-09 DIAGNOSIS — R945 Abnormal results of liver function studies: Secondary | ICD-10-CM | POA: Diagnosis not present

## 2019-03-09 NOTE — Progress Notes (Signed)
   Subjective:    Patient ID: Taylor Williams, female    DOB: [birth year], 68 y.o.   MRN: [id number]  HPI  Taylor Williams is a 68 y.o. female with a past medical history significant for DM II, HTN, anemia, hyperlipidemia, anxiety, depression and breast cancer stage 1a s/p left breast lumpectomy 11/30/2015 s/p 4 cycles of TC 01/12/2016 - 03/15/2016 and bilateral pulmonary nodules under CT surveillance. She was recently seen by her oncologist, Dr.Sreedhar Delton Coombes, who advised further GI evaluation for cirrhosis seen on prior Pet scan and 2 Chest CT scans since 2017.   Refer to office visit  02/23/2019 for further cirrhosis evaluation. She presents today to discuss her lab and abdominal sonogram results. Laboratory studies done 03/05/2019: Na 136. K 3.8. AST 119. ALT 99. Alk phos 99. T. Bili 0.9. Chol 208. LDL 139. Iron 81. Ferritin 27. Ceruloplasmin 21.7. Elevated IgG 1,739. A1AT 174. Elevated AMA 42.5. INR 1.0. Hep A IgM and total ab negative. Hep C antibody negative.  Hep B surface antibody < 3.1. Hep B core total negative. Hep B surface ag not done as error. SMA was ordered not done in error by lab. She was started on Spironolactone 83m QD and her LE edema has significantly improved. An abdominal sonogram 03/05/2019 identified cirrhosis, mild splenomegaly and past cholecystectomy.      Objective:   Physical Exam BP 122/71   Pulse 76   Temp 97.6 F (36.4 C)   Ht 5' 8"  (1.727 m)   Wt 211 lb 8 oz (95.9 kg)   BMI 32.16 kg/m  General:  Well developed female in NAD Heart: RRR, no murmur Lungs: clear Abdomen: soft, nontender, no HSM, no ascites Extremities: trace edema to ankles, improved when compared to 7/28 exam    Assessment & Plan:   1. 68y.o. female with history of stage 1a breast cancer s/p lumpectomy 11/2015 presents for further evaluation regarding elevated LFTs and compensated cirrhosis of unclear etiology (per chest CT and prior PET scan. AMA and IgG elevated. -liver biopsy, benefits and risks  discussed -further treatment and follow up to be determined after liver biopsy completed -will schedule EGD to survey for esophageal varices after liver biopsy completed -continue Spironolactone 265mQD -Hep B surface antigen and SMA

## 2019-03-09 NOTE — Patient Instructions (Addendum)
1. Liver biopsy to be scheduled at [facility]   2. Further follow up and treatment to be determined after liver biopsy results received  3. I will provide orders for an upper endoscopy (EGD) after liver biopsy completed   4. Complete the lab order as discussed during today's office visit   5.  2gm low sodium diet  6. Please schedule an office appointment with your primary care physician as you will require Hepatitis A and Hepatitis B vaccinations

## 2019-03-16 ENCOUNTER — Other Ambulatory Visit: Payer: Self-pay | Admitting: Radiology

## 2019-03-17 ENCOUNTER — Other Ambulatory Visit: Payer: Self-pay

## 2019-03-17 ENCOUNTER — Ambulatory Visit (HOSPITAL_COMMUNITY)
Admission: RE | Admit: 2019-03-17 | Discharge: 2019-03-17 | Disposition: A | Discharge status: Home / Self Care | Payer: Medicare Other | Source: Ambulatory Visit | Attending: Nurse Practitioner | Admitting: Nurse Practitioner

## 2019-03-17 ENCOUNTER — Encounter (HOSPITAL_COMMUNITY): Payer: Self-pay

## 2019-03-17 DIAGNOSIS — I10 Essential (primary) hypertension: Secondary | ICD-10-CM | POA: Diagnosis not present

## 2019-03-17 DIAGNOSIS — Z79899 Other long term (current) drug therapy: Secondary | ICD-10-CM | POA: Diagnosis not present

## 2019-03-17 DIAGNOSIS — Z833 Family history of diabetes mellitus: Secondary | ICD-10-CM | POA: Diagnosis not present

## 2019-03-17 DIAGNOSIS — I700 Atherosclerosis of aorta: Secondary | ICD-10-CM | POA: Diagnosis not present

## 2019-03-17 DIAGNOSIS — Z818 Family history of other mental and behavioral disorders: Secondary | ICD-10-CM | POA: Insufficient documentation

## 2019-03-17 DIAGNOSIS — Z794 Long term (current) use of insulin: Secondary | ICD-10-CM | POA: Diagnosis not present

## 2019-03-17 DIAGNOSIS — E119 Type 2 diabetes mellitus without complications: Secondary | ICD-10-CM | POA: Insufficient documentation

## 2019-03-17 DIAGNOSIS — Z853 Personal history of malignant neoplasm of breast: Secondary | ICD-10-CM | POA: Diagnosis not present

## 2019-03-17 DIAGNOSIS — Z9049 Acquired absence of other specified parts of digestive tract: Secondary | ICD-10-CM | POA: Diagnosis not present

## 2019-03-17 DIAGNOSIS — Z91013 Allergy to seafood: Secondary | ICD-10-CM | POA: Insufficient documentation

## 2019-03-17 DIAGNOSIS — K7460 Unspecified cirrhosis of liver: Secondary | ICD-10-CM | POA: Insufficient documentation

## 2019-03-17 DIAGNOSIS — Z809 Family history of malignant neoplasm, unspecified: Secondary | ICD-10-CM | POA: Insufficient documentation

## 2019-03-17 DIAGNOSIS — F419 Anxiety disorder, unspecified: Secondary | ICD-10-CM | POA: Insufficient documentation

## 2019-03-17 DIAGNOSIS — Z8249 Family history of ischemic heart disease and other diseases of the circulatory system: Secondary | ICD-10-CM | POA: Insufficient documentation

## 2019-03-17 DIAGNOSIS — Z888 Allergy status to other drugs, medicaments and biological substances status: Secondary | ICD-10-CM | POA: Insufficient documentation

## 2019-03-17 DIAGNOSIS — R945 Abnormal results of liver function studies: Secondary | ICD-10-CM | POA: Diagnosis not present

## 2019-03-17 DIAGNOSIS — F17200 Nicotine dependence, unspecified, uncomplicated: Secondary | ICD-10-CM | POA: Insufficient documentation

## 2019-03-17 DIAGNOSIS — Z9109 Other allergy status, other than to drugs and biological substances: Secondary | ICD-10-CM | POA: Insufficient documentation

## 2019-03-17 DIAGNOSIS — F329 Major depressive disorder, single episode, unspecified: Secondary | ICD-10-CM | POA: Insufficient documentation

## 2019-03-17 DIAGNOSIS — R7989 Other specified abnormal findings of blood chemistry: Secondary | ICD-10-CM | POA: Insufficient documentation

## 2019-03-17 LAB — PROTIME-INR
INR: 1.1 (ref 0.8–1.2)
Prothrombin Time: 13.9 seconds (ref 11.4–15.2)

## 2019-03-17 LAB — CBC
HCT: 42.2 % (ref 36.0–46.0)
Hemoglobin: 13.5 g/dL (ref 12.0–15.0)
MCH: 29.5 pg (ref 26.0–34.0)
MCHC: 32 g/dL (ref 30.0–36.0)
MCV: 92.3 fL (ref 80.0–100.0)
Platelets: 239 10*3/uL (ref 150–400)
RBC: 4.57 MIL/uL (ref 3.87–5.11)
RDW: 15.4 % (ref 11.5–15.5)
WBC: 7.4 10*3/uL (ref 4.0–10.5)
nRBC: 0 % (ref 0.0–0.2)

## 2019-03-17 LAB — GLUCOSE, CAPILLARY: Glucose-Capillary: 149 mg/dL — ABNORMAL HIGH (ref 70–99)

## 2019-03-17 MED ORDER — MIDAZOLAM HCL 2 MG/2ML IJ SOLN
INTRAMUSCULAR | Status: AC | PRN
  Administered 2019-03-17 (×2): 1 mg via INTRAVENOUS

## 2019-03-17 MED ORDER — SODIUM CHLORIDE 0.9 % IV SOLN: INTRAVENOUS | Status: DC

## 2019-03-17 MED ORDER — SODIUM CHLORIDE 0.9 % IV SOLN
INTRAVENOUS | Status: AC | PRN
  Administered 2019-03-17: 10 mL/h via INTRAVENOUS

## 2019-03-17 MED ORDER — MIDAZOLAM HCL 2 MG/2ML IJ SOLN
INTRAMUSCULAR | Status: AC
  Filled 2019-03-17: qty 2

## 2019-03-17 MED ORDER — GELATIN ABSORBABLE 12-7 MM EX MISC
CUTANEOUS | Status: AC
  Filled 2019-03-17: qty 1

## 2019-03-17 MED ORDER — FENTANYL CITRATE (PF) 100 MCG/2ML IJ SOLN
INTRAMUSCULAR | Status: AC
  Filled 2019-03-17: qty 2

## 2019-03-17 MED ORDER — FENTANYL CITRATE (PF) 100 MCG/2ML IJ SOLN
INTRAMUSCULAR | Status: AC | PRN
  Administered 2019-03-17: 50 ug via INTRAVENOUS

## 2019-03-17 MED ORDER — LIDOCAINE HCL (PF) 1 % IJ SOLN
INTRAMUSCULAR | Status: AC
  Filled 2019-03-17: qty 30

## 2019-03-17 NOTE — Discharge Instructions (Signed)
Liver Biopsy, Care After These instructions give you information about how to care for yourself after your procedure. Your health care provider may also give you more specific instructions. If you have problems or questions, contact your health care provider. What can I expect after the procedure? After your procedure, it is common to have:  Pain and soreness in the area where the biopsy was done.  Bruising around the area where the biopsy was done.  Sleepiness and fatigue for 1-2 days. Follow these instructions at home: Medicines  Take over-the-counter and prescription medicines only as told by your health care provider.  If you were prescribed an antibiotic medicine, take it as told by your health care provider. Do not stop taking the antibiotic even if you start to feel better.  Do not take medicines such as aspirin and ibuprofen unless your health care provider tells you to take them. These medicines thin your blood and can increase the risk of bleeding.  If you are taking prescription pain medicine, take actions to prevent or treat constipation. Your health care provider may recommend that you: ? Drink enough fluid to keep your urine pale yellow. ? Eat foods that are high in fiber, such as fresh fruits and vegetables, whole grains, and beans. ? Limit foods that are high in fat and processed sugars, such as fried or sweet foods. ? Take an over-the-counter or prescription medicine for constipation. Incision care  Follow instructions from your health care provider about how to take care of your incision. Make sure you: ? Wash your hands with soap and water before you change your bandage (dressing). If soap and water are not available, use hand sanitizer. ? Change your dressing as told by your health care provider. ? Leave stitches (sutures), skin glue, or adhesive strips in place. These skin closures may need to stay in place for 2 weeks or longer. If adhesive strip edges start to  loosen and curl up, you may trim the loose edges. Do not remove adhesive strips completely unless your health care provider tells you to do that.  Check your incision area every day for signs of infection. Check for: ? Redness, swelling, or pain. ? Fluid or blood. ? Warmth. ? Pus or a bad smell.  Do not take baths, swim, or use a hot tub until your health care provider says it is okay to do so. Activity   Rest at home for 1-2 days, or as directed by your health care provider. ? Avoid sitting for a long time without moving. Get up to take short walks every 1-2 hours. This is important to improve blood flow and breathing. Ask for help if you feel weak or unsteady.  Return to your normal activities as told by your health care provider. Ask your health care provider what activities are safe for you.  Do not drive or use heavy machinery while taking prescription pain medicine.  Do not lift anything that is heavier than 10 lb (4.5 kg), or the limit that your health care provider tells you, until he or she says that it is safe.  Do not play contact sports for 2 weeks after the procedure. General instructions   Do not drink alcohol in the first week after the procedure.  Have someone stay with you for at least 24 hours after the procedure.  It is your responsibility to obtain your test results. Ask your health care provider, or the department that is doing the test: ? When will my  results be ready? ? How will I get my results? ? What are my treatment options? ? What other tests do I need? ? What are my next steps?  Keep all follow-up visits as told by your health care provider. This is important. Contact a health care provider if:  You have increased bleeding from an incision, resulting in more than a small spot of blood.  You have redness, swelling, or increasing pain in any incisions.  You notice a discharge or a bad smell coming from any of your incisions.  You have a fever or  chills. Get help right away if:  You develop swelling, bloating, or pain in your abdomen.  You become dizzy or faint.  You develop a rash.  You have nausea or you vomit.  You faint, or you have shortness of breath or difficulty breathing.  You develop chest pain.  You have problems with your speech or vision.  You have trouble with your balance or moving your arms or legs. Summary  After the liver biopsy, it is common to have pain, soreness, and bruising in the area, as well as sleepiness and fatigue.  Take over-the-counter and prescription medicines only as told by your health care provider.  Follow instructions from your health care provider about how to care for your incision. Check the incision area daily for signs of infection. This information is not intended to replace advice given to you by your health care provider. Make sure you discuss any questions you have with your health care provider. Document Released: 02/01/2005 Document Revised: 09/07/2018 Document Reviewed: 07/25/2017 Elsevier Patient Education  [place]. Moderate Conscious Sedation, Adult, Care After These instructions provide you with information about caring for yourself after your procedure. Your health care provider may also give you more specific instructions. Your treatment has been planned according to current medical practices, but problems sometimes occur. Call your health care provider if you have any problems or questions after your procedure. What can I expect after the procedure? After your procedure, it is common:  To feel sleepy for several hours.  To feel clumsy and have poor balance for several hours.  To have poor judgment for several hours.  To vomit if you eat too soon. Follow these instructions at home: For at least 24 hours after the procedure:   Do not: ? Participate in activities where you could fall or become injured. ? Drive. ? Use heavy machinery. ? Drink  alcohol. ? Take sleeping pills or medicines that cause drowsiness. ? Make important decisions or sign legal documents. ? Take care of children on your own.  Rest. Eating and drinking  Follow the diet recommended by your health care provider.  If you vomit: ? Drink water, juice, or soup when you can drink without vomiting. ? Make sure you have little or no nausea before eating solid foods. General instructions  Have a responsible adult stay with you until you are awake and alert.  Take over-the-counter and prescription medicines only as told by your health care provider.  If you smoke, do not smoke without supervision.  Keep all follow-up visits as told by your health care provider. This is important. Contact a health care provider if:  You keep feeling nauseous or you keep vomiting.  You feel light-headed.  You develop a rash.  You have a fever. Get help right away if:  You have trouble breathing. This information is not intended to replace advice given to you by your health  care provider. Make sure you discuss any questions you have with your health care provider. Document Released: 05/05/2013 Document Revised: 06/27/2017 Document Reviewed: 11/04/2015 Elsevier Patient Education  2020 [employer].

## 2019-03-17 NOTE — H&P (Signed)
Chief Complaint: Patient was seen in consultation today for liver core biopsy at the request of Noralyn Pick  Referring Physician(s): Dr Laural Golden  Supervising Physician: Aletta Edouard  Patient Status: [facility]  History of Present Illness: Taylor Williams is a 68 y.o. female   Hx Breast Ca 2017 Elevated LFTS per Oncology Compensated cirrhosis  [place] 8/7: IMPRESSION: Remote cholecystectomy. Hepatic cirrhosis Mild splenomegaly Aortic atherosclerosis without aneurysm  CT 02/12/19: IMPRESSION: 1. Stable surgical and radiation changes involving the left breast. No findings suspicious for recurrent breast cancer and no axillary or supraclavicular adenopathy. 2. Scattered mediastinal and hilar lymph nodes are stable and likely benign. 3. Stable pulmonary nodules as detailed above. No new or progressive findings. 4. Advanced cirrhotic changes involving the liver with portal venous hypertension and portal venous collaterals. Patient probably needs some type of liver surveillance at least with ultrasound or possibly MRI  Scheduled now for liver core biopsy  Past Medical History:  Diagnosis Date  . Anemia   . Anxiety   . Breast cancer ([place])   . Depression   . Diabetes mellitus without complication ([place])   . High cholesterol   . Hypertension     Past Surgical History:  Procedure Laterality Date  . BREAST LUMPECTOMY Left    states for ductal carcinoma, had surgery in late may  . COLONOSCOPY    . ENDOVENOUS ABLATION SAPHENOUS VEIN W/ LASER Right 04/15/2018   endovenous laser ablation R GSV by Ruta Hinds MD   . ENDOVENOUS ABLATION SAPHENOUS VEIN W/ LASER Left 05/13/2018   endovenous laser ablation left greater saphenous vein by Ruta Hinds MD   . galbladder    . GANGLION CYST EXCISION    . TONSILLECTOMY AND ADENOIDECTOMY      Allergies: Aspirin, Other, Fish allergy, and Procaine  Medications: Prior to Admission medications   Medication Sig Start  Date End Date Taking? Authorizing Provider  ALPRAZolam (XANAX) 0.25 MG tablet Take 0.25 mg by mouth 2 (two) times daily.   Yes [provider]  Biotin w/ Vitamins C & E (HAIR/SKIN/NAILS PO) Take 1-2 capsules by mouth See admin instructions. 2 caps in the morning, 1 cap at bedtime   Yes [provider]  cetirizine (ZYRTEC) 10 MG tablet Take 10 mg by mouth daily.   Yes [provider]  citalopram (CELEXA) 20 MG tablet Take 20 mg by mouth daily.   Yes [provider]  dapagliflozin propanediol (FARXIGA) 5 MG TABS tablet Take 5 mg by mouth daily.   Yes [provider]  doxycycline (VIBRAMYCIN) 50 MG capsule Take 50 mg by mouth daily.   Yes [provider]  furosemide (LASIX) 20 MG tablet Take 1 tablet (20 mg total) by mouth daily. 05/31/16  Yes Penland, Kelby Fam, MD  glipiZIDE (GLUCOTROL) 10 MG tablet Take 10 mg by mouth 2 (two) times daily before a meal.   Yes [provider]  hydrocortisone 2.5 % cream Apply 1 application topically at bedtime.    Yes [provider]  insulin glargine (LANTUS) 100 UNIT/ML injection Inject 35 Units into the skin at bedtime.    Yes [provider]  ketoconazole (NIZORAL) 2 % cream Apply 1 application topically 2 (two) times daily.   Yes [provider]  Javier Docker Oil 1000 MG CAPS Take 1,000-2,000 mg by mouth See admin instructions. 2000 mg in the morning, 1000 mg at bedtime   Yes [provider]  lisinopril (PRINIVIL,ZESTRIL) 5 MG tablet Take 5 mg by  mouth daily.   Yes [provider]  loperamide (IMODIUM A-D) 2 MG tablet Take 4 mg by mouth 2 (two) times daily.   Yes [provider]  metFORMIN (GLUCOPHAGE) 1000 MG tablet Take 1,000 mg by mouth 2 (two) times daily with a meal.   Yes [provider]  potassium chloride SA (K-DUR,KLOR-CON) 20 MEQ tablet Take 1 tablet (20 mEq total) by mouth daily. 05/31/16  Yes Penland, Kelby Fam, MD  sitaGLIPtin  (JANUVIA) 100 MG tablet Take 100 mg by mouth daily.   Yes [provider]  spironolactone (ALDACTONE) 25 MG tablet Take 1 tablet (25 mg total) by mouth daily. 02/23/19  Yes Setzer, Rona Ravens, NP  venlafaxine XR (EFFEXOR-XR) 75 MG 24 hr capsule Take 75 mg by mouth 2 (two) times daily.   Yes [provider]     Family History  Problem Relation Age of Onset  . Cancer - Other Mother   . Hypertension Father   . Diabetes Father   . Dementia Father   . Hyperthyroidism Brother   . Cancer Paternal Grandmother   . Cancer Paternal Grandfather     Social History   Socioeconomic History  . Marital status: Widowed    Spouse name: Not on file  . Number of children: Not on file  . Years of education: Not on file  . Highest education level: Not on file  Occupational History  . Not on file  Social Needs  . Financial resource strain: Not on file  . Food insecurity    Worry: Not on file    Inability: Not on file  . Transportation needs    Medical: Not on file    Non-medical: Not on file  Tobacco Use  . Smoking status: Current Every Day Smoker    Packs/day: 1.00    Years: 40.00    Pack years: 40.00    Start date: 10/28/1965  . Smokeless tobacco: Never Used  Substance and Sexual Activity  . Alcohol use: No    Comment: 2 - 3 mixed drink yearly  . Drug use: No  . Sexual activity: Not on file  Lifestyle  . Physical activity    Days per week: Not on file    Minutes per session: Not on file  . Stress: Not on file  Relationships  . Social Herbalist on phone: Not on file    Gets together: Not on file    Attends religious service: Not on file    Active member of club or organization: Not on file    Attends meetings of clubs or organizations: Not on file    Relationship status: Not on file  Other Topics Concern  . Not on file  Social History Narrative  . Not on file    Review of Systems: A 12 point ROS discussed and pertinent positives are indicated in the  HPI above.  All other systems are negative.  Review of Systems  Constitutional: Positive for fatigue. Negative for activity change and fever.  Respiratory: Negative for cough and shortness of breath.   Cardiovascular: Negative for chest pain.  Gastrointestinal: Negative for abdominal pain.  Neurological: Negative for weakness.  Psychiatric/Behavioral: Negative for behavioral problems and confusion.    Vital Signs: BP (!) 114/55   Pulse 65   Temp 97.9 F (36.6 C) (Oral)   Resp 16   Ht 5' 8"  (1.727 m)   Wt 211 lb (95.7 kg)   SpO2 100%   BMI  32.08 kg/m   Physical Exam Vitals signs reviewed.  Cardiovascular:     Rate and Rhythm: Normal rate and regular rhythm.     Heart sounds: Normal heart sounds.  Pulmonary:     Effort: Pulmonary effort is normal.     Breath sounds: Normal breath sounds.  Abdominal:     Palpations: Abdomen is soft.  Musculoskeletal: Normal range of motion.  Skin:    General: Skin is warm and dry.  Neurological:     Mental Status: She is alert and oriented to person, place, and time.  Psychiatric:        Mood and Affect: Mood normal.        Behavior: Behavior normal.        Thought Content: Thought content normal.        Judgment: Judgment normal.     Imaging: US Abdomen Complete  Result Date: 03/05/2019 CLINICAL DATA:  Cirrhosis, previous cholecystectomy, history of breast cancer EXAM: ABDOMEN ULTRASOUND COMPLETE COMPARISON:  02/12/2019 FINDINGS: Gallbladder: Surgically absent Common bile duct: Diameter: 7 mm Liver: Nodular surface contour and increased echogenicity compatible with underlying hepatic cirrhosis. No biliary dilatation. No large focal hepatic abnormality. No surrounding free fluid or ascites. Portal vein is patent on color Doppler imaging with normal direction of blood flow towards the liver. IVC: No abnormality visualized. Pancreas: Included portion unremarkable. Spleen: Mildly enlarged. 12.4 cm length. Adjacent tortuous vessels noted  compatible with known varices better demonstrated by CT. Right Kidney: Length: 13.1 cm. Echogenicity within normal limits. No mass or hydronephrosis visualized. Left Kidney: Length: 11.9 cm. Echogenicity within normal limits. No mass or hydronephrosis visualized. Abdominal aorta: Obscured by bowel gas. No large aneurysm. 2 cm maximal diameter. Atherosclerosis noted. Other findings: No free fluid or ascites. IMPRESSION: Remote cholecystectomy. Hepatic cirrhosis Mild splenomegaly Aortic atherosclerosis without aneurysm Electronically Signed   By: Jerilynn Mages.  Shick M.D.   On: 03/05/2019 13:15    Labs:  CBC: Recent Labs    02/12/19 1023 03/17/19 0613  WBC 5.3 7.4  HGB 13.0 13.5  HCT 41.0 42.2  PLT 188 239    COAGS: Recent Labs    03/05/19 0930 03/17/19 0613  INR 1.0 1.1    BMP: Recent Labs    02/12/19 1023 03/05/19 0930  NA 133* 136  K 4.9 3.8  CL 101 103  CO2 25 23  GLUCOSE 180* 181*  BUN 10 11  CALCIUM 9.1 9.4  CREATININE 0.55 0.58  GFRNONAA >60 >60  GFRAA >60 >60    LIVER FUNCTION TESTS: Recent Labs    02/12/19 1023 03/05/19 0930  BILITOT 0.7 0.9  AST 85* 119*  ALT 67* 99*  ALKPHOS 91 99  PROT 7.1 7.8  ALBUMIN 3.2* 3.4*    TUMOR MARKERS: No results for input(s): AFPTM, CEA, CA199, CHROMGRNA in the last 8760 hours.  Assessment and Plan:  Elevated liver functions Cirrhosis Worrisome-- Primary Biliary Cholangitis Scheduled for liver core bx Risks and benefits of liver core bx was discussed with the patient and/or patient's family including, but not limited to bleeding, infection, damage to adjacent structures or low yield requiring additional tests.  All of the questions were answered and there is agreement to proceed. Consent signed and in chart.  Thank you for this interesting consult.  I greatly enjoyed meeting Taylor Williams and look forward to participating in their care.  A copy of this report was sent to the requesting provider on this date.   Electronically Signed: Lavonia Drafts, PA-C 03/17/2019, 7:46  AM   I spent a total of  30 Minutes   in face to face in clinical consultation, greater than 50% of which was counseling/coordinating care for liver core bx

## 2019-03-17 NOTE — Procedures (Signed)
Interventional Radiology Procedure Note  Procedure: US Guided Biopsy of liver  Complications: None  Estimated Blood Loss: < 10 mL  Findings: 18 G core biopsy of right lobe liver parenchyma performed under US guidance.  Two core samples obtained and sent to Pathology.  Venetia Night. Kathlene Cote, M.D Pager:  [phone]

## 2019-03-22 ENCOUNTER — Other Ambulatory Visit (INDEPENDENT_AMBULATORY_CARE_PROVIDER_SITE_OTHER): Payer: Self-pay | Admitting: Nurse Practitioner

## 2019-03-22 ENCOUNTER — Telehealth (INDEPENDENT_AMBULATORY_CARE_PROVIDER_SITE_OTHER): Payer: Self-pay | Admitting: Nurse Practitioner

## 2019-03-22 DIAGNOSIS — K7460 Unspecified cirrhosis of liver: Secondary | ICD-10-CM

## 2019-03-22 NOTE — Telephone Encounter (Signed)
I called patient, I discussed her liver biopsy showed NASH cirrhosis. Patient will need to proceed with EGD to assess for esophageal varices. Benefits and risks discussed with patient. LE edema has improved significantly since starting Spironolactone 61m QD, will continue for next moth for now. Advised follow up appt in office 2 weeks after EGD completed.  Ann, pls call patient to schedule EGD with MAC. Orders in [facility] Pt to hold Metformin pm and am before procedure, hold Glipizide am of procedure and take 1/2 dose of Lantus insulin evening before procedure. thx

## 2019-03-22 NOTE — Telephone Encounter (Signed)
Patient returned your call  Ph# [phone]

## 2019-03-23 ENCOUNTER — Encounter (INDEPENDENT_AMBULATORY_CARE_PROVIDER_SITE_OTHER): Payer: Self-pay | Admitting: *Deleted

## 2019-03-23 DIAGNOSIS — K7460 Unspecified cirrhosis of liver: Secondary | ICD-10-CM | POA: Insufficient documentation

## 2019-03-23 NOTE — Telephone Encounter (Signed)
EGD sch'd 04/19/19, patient aware, instructions mailed F/U OV 05/03/19, patient aware, appt info mailed

## 2019-03-30 ENCOUNTER — Telehealth (INDEPENDENT_AMBULATORY_CARE_PROVIDER_SITE_OTHER): Payer: Self-pay | Admitting: Nurse Practitioner

## 2019-03-30 ENCOUNTER — Other Ambulatory Visit (INDEPENDENT_AMBULATORY_CARE_PROVIDER_SITE_OTHER): Payer: Self-pay | Admitting: Nurse Practitioner

## 2019-03-30 DIAGNOSIS — K7460 Unspecified cirrhosis of liver: Secondary | ICD-10-CM

## 2019-03-30 MED ORDER — SPIRONOLACTONE 25 MG PO TABS: 25.0000 mg | ORAL_TABLET | Freq: Every day | ORAL | 0 refills | Status: DC

## 2019-03-30 NOTE — Telephone Encounter (Signed)
I called patient and left a msg on her voicemail, Spironolactone RX refilled and she needs to get BMP rechecked in 1 to 2 weeks, order entered for [facility].

## 2019-03-30 NOTE — Telephone Encounter (Signed)
Patient called about a refill being sent to [facility]. Patient ph# [phone]

## 2019-04-13 NOTE — Patient Instructions (Signed)
25    Your procedure is scheduled on: 04/19/2019  Report to [facility] at  6:15   AM.  Call this number if you have problems the morning of surgery: [phone]   Remember:   Do not drink or eat food:After Midnight.      Take these medicines the morning of surgery with A SIP OF WATER: Celexa and Zyrtec              Take only 1/2 dose of Lantus  (17.5 units)  the night before procedure   Do not wear jewelry, make-up or nail polish.  Do not wear lotions, powders, or perfumes. You may wear deodorant.                Do not bring valuables to the hospital.  Contacts, dentures or bridgework may not be worn into surgery.  Leave suitcase in the car. After surgery it may be brought to your room.  For patients admitted to the hospital, checkout time is 11:00 AM the day of discharge.   Patients discharged the day of surgery will not be allowed to drive home.                                                                                                                                    Endoscopy Care After Please read the instructions outlined below and refer to this sheet in the next few weeks. These discharge instructions provide you with general information on caring for yourself after you leave the hospital. Your doctor may also give you specific instructions. While your treatment has been planned according to the most current medical practices available, unavoidable complications occasionally occur. If you have any problems or questions after discharge, please call your doctor. HOME CARE INSTRUCTIONS Activity  You may resume your regular activity but move at a slower pace for the next 24 hours.   Take frequent rest periods for the next 24 hours.   Walking will help expel (get rid of) the air and reduce the bloated feeling in your abdomen.   No driving for 24 hours (because of the anesthesia (medicine) used during the test).   You may shower.   Do not sign any important legal documents  or operate any machinery for 24 hours (because of the anesthesia used during the test).  Nutrition  Drink plenty of fluids.   You may resume your normal diet.   Begin with a light meal and progress to your normal diet.   Avoid alcoholic beverages for 24 hours or as instructed by your caregiver.  Medications You may resume your normal medications unless your caregiver tells you otherwise. What you can expect today  You may experience abdominal discomfort such as a feeling of fullness or "gas" pains.   You may experience a sore throat for 2 to 3 days. This is normal. Gargling with salt water may help this.  Follow-up Your  doctor will discuss the results of your test with you. SEEK IMMEDIATE MEDICAL CARE IF:  You have excessive nausea (feeling sick to your stomach) and/or vomiting.   You have severe abdominal pain and distention (swelling).   You have trouble swallowing.   You have a temperature over 100 F (37.8 C).   You have rectal bleeding or vomiting of blood.  Document Released: 02/27/2004 Document Revised: 07/04/2011 Document Reviewed: 09/09/2007

## 2019-04-15 ENCOUNTER — Encounter (HOSPITAL_COMMUNITY): Payer: Self-pay

## 2019-04-15 ENCOUNTER — Other Ambulatory Visit (HOSPITAL_COMMUNITY)
Admission: RE | Admit: 2019-04-15 | Discharge: 2019-04-15 | Disposition: A | Discharge status: Home / Self Care | Payer: Medicare Other | Source: Ambulatory Visit | Attending: Internal Medicine | Admitting: Internal Medicine

## 2019-04-15 ENCOUNTER — Other Ambulatory Visit: Payer: Self-pay

## 2019-04-15 ENCOUNTER — Encounter (HOSPITAL_COMMUNITY)
Admission: RE | Admit: 2019-04-15 | Discharge: 2019-04-15 | Disposition: A | Discharge status: Home / Self Care | Payer: Medicare Other | Source: Ambulatory Visit | Attending: Internal Medicine | Admitting: Internal Medicine

## 2019-04-15 DIAGNOSIS — Z01812 Encounter for preprocedural laboratory examination: Secondary | ICD-10-CM | POA: Insufficient documentation

## 2019-04-15 DIAGNOSIS — Z20828 Contact with and (suspected) exposure to other viral communicable diseases: Secondary | ICD-10-CM | POA: Diagnosis not present

## 2019-04-15 HISTORY — DX: Other complications of anesthesia, initial encounter: T88.59XA

## 2019-04-15 LAB — BASIC METABOLIC PANEL
Anion gap: 7 (ref 5–15)
BUN: 9 mg/dL (ref 8–23)
CO2: 26 mmol/L (ref 22–32)
Calcium: 9 mg/dL (ref 8.9–10.3)
Chloride: 105 mmol/L (ref 98–111)
Creatinine, Ser: 0.58 mg/dL (ref 0.44–1.00)
GFR calc Af Amer: >60 mL/min (ref >60)
GFR calc non Af Amer: >60 mL/min (ref >60)
Glucose, Bld: 110 mg/dL — ABNORMAL HIGH (ref 70–99)
Potassium: 4.1 mmol/L (ref 3.5–5.1)
Sodium: 138 mmol/L (ref 135–145)

## 2019-04-15 LAB — SARS CORONAVIRUS 2 (TAT 6-24 HRS): SARS Coronavirus 2: NEGATIVE

## 2019-04-19 ENCOUNTER — Encounter (HOSPITAL_COMMUNITY)
Admission: RE | Disposition: A | Discharge status: Home / Self Care | Payer: Self-pay | Source: Home / Self Care | Attending: Internal Medicine

## 2019-04-19 ENCOUNTER — Ambulatory Visit (HOSPITAL_COMMUNITY)
Admission: RE | Admit: 2019-04-19 | Discharge: 2019-04-19 | Disposition: A | Discharge status: Home / Self Care | Payer: Medicare Other | Attending: Internal Medicine | Admitting: Internal Medicine

## 2019-04-19 ENCOUNTER — Encounter (HOSPITAL_COMMUNITY): Payer: Self-pay | Admitting: Anesthesiology

## 2019-04-19 ENCOUNTER — Ambulatory Visit (HOSPITAL_COMMUNITY): Payer: Medicare Other | Admitting: Anesthesiology

## 2019-04-19 ENCOUNTER — Other Ambulatory Visit: Payer: Self-pay

## 2019-04-19 DIAGNOSIS — Z794 Long term (current) use of insulin: Secondary | ICD-10-CM | POA: Diagnosis not present

## 2019-04-19 DIAGNOSIS — F17200 Nicotine dependence, unspecified, uncomplicated: Secondary | ICD-10-CM | POA: Insufficient documentation

## 2019-04-19 DIAGNOSIS — K7581 Nonalcoholic steatohepatitis (NASH): Secondary | ICD-10-CM | POA: Diagnosis not present

## 2019-04-19 DIAGNOSIS — Z853 Personal history of malignant neoplasm of breast: Secondary | ICD-10-CM | POA: Insufficient documentation

## 2019-04-19 DIAGNOSIS — K3189 Other diseases of stomach and duodenum: Secondary | ICD-10-CM | POA: Insufficient documentation

## 2019-04-19 DIAGNOSIS — K2980 Duodenitis without bleeding: Secondary | ICD-10-CM

## 2019-04-19 DIAGNOSIS — E119 Type 2 diabetes mellitus without complications: Secondary | ICD-10-CM | POA: Insufficient documentation

## 2019-04-19 DIAGNOSIS — K7460 Unspecified cirrhosis of liver: Secondary | ICD-10-CM | POA: Diagnosis not present

## 2019-04-19 DIAGNOSIS — K210 Gastro-esophageal reflux disease with esophagitis: Secondary | ICD-10-CM | POA: Insufficient documentation

## 2019-04-19 DIAGNOSIS — Z79899 Other long term (current) drug therapy: Secondary | ICD-10-CM | POA: Diagnosis not present

## 2019-04-19 DIAGNOSIS — F329 Major depressive disorder, single episode, unspecified: Secondary | ICD-10-CM | POA: Insufficient documentation

## 2019-04-19 DIAGNOSIS — F419 Anxiety disorder, unspecified: Secondary | ICD-10-CM | POA: Insufficient documentation

## 2019-04-19 DIAGNOSIS — E7800 Pure hypercholesterolemia, unspecified: Secondary | ICD-10-CM | POA: Insufficient documentation

## 2019-04-19 DIAGNOSIS — K269 Duodenal ulcer, unspecified as acute or chronic, without hemorrhage or perforation: Secondary | ICD-10-CM | POA: Diagnosis not present

## 2019-04-19 DIAGNOSIS — I10 Essential (primary) hypertension: Secondary | ICD-10-CM | POA: Insufficient documentation

## 2019-04-19 DIAGNOSIS — K209 Esophagitis, unspecified: Secondary | ICD-10-CM | POA: Diagnosis not present

## 2019-04-19 DIAGNOSIS — K766 Portal hypertension: Secondary | ICD-10-CM | POA: Insufficient documentation

## 2019-04-19 DIAGNOSIS — K449 Diaphragmatic hernia without obstruction or gangrene: Secondary | ICD-10-CM | POA: Diagnosis not present

## 2019-04-19 DIAGNOSIS — K228 Other specified diseases of esophagus: Secondary | ICD-10-CM | POA: Diagnosis not present

## 2019-04-19 HISTORY — PX: ESOPHAGOGASTRODUODENOSCOPY (EGD) WITH PROPOFOL: SHX5813

## 2019-04-19 HISTORY — PX: BIOPSY: SHX5522

## 2019-04-19 LAB — GLUCOSE, CAPILLARY
Glucose-Capillary: 149 mg/dL — ABNORMAL HIGH (ref 70–99)
Glucose-Capillary: 153 mg/dL — ABNORMAL HIGH (ref 70–99)

## 2019-04-19 SURGERY — ESOPHAGOGASTRODUODENOSCOPY (EGD) WITH PROPOFOL
Anesthesia: General

## 2019-04-19 MED ORDER — LACTATED RINGERS IV SOLN
INTRAVENOUS | Status: DC
  Administered 2019-04-19: 07:00:00 via INTRAVENOUS

## 2019-04-19 MED ORDER — HYDROCODONE-ACETAMINOPHEN 7.5-325 MG PO TABS: 1.0000 | ORAL_TABLET | Freq: Once | ORAL | Status: DC | PRN

## 2019-04-19 MED ORDER — PROPOFOL 500 MG/50ML IV EMUL
INTRAVENOUS | Status: DC | PRN
  Administered 2019-04-19: 150 ug/kg/min via INTRAVENOUS

## 2019-04-19 MED ORDER — SPIRONOLACTONE 25 MG PO TABS: 25.0000 mg | ORAL_TABLET | Freq: Every day | ORAL | 0 refills | Status: DC

## 2019-04-19 MED ORDER — PROPOFOL 10 MG/ML IV BOLUS
INTRAVENOUS | Status: AC
  Filled 2019-04-19: qty 40

## 2019-04-19 MED ORDER — PANTOPRAZOLE SODIUM 40 MG PO TBEC: 40.0000 mg | DELAYED_RELEASE_TABLET | Freq: Every day | ORAL | 5 refills | Status: DC

## 2019-04-19 MED ORDER — LIDOCAINE HCL (PF) 1 % IJ SOLN
INTRAMUSCULAR | Status: AC
  Filled 2019-04-19: qty 5

## 2019-04-19 MED ORDER — HYDROMORPHONE HCL 1 MG/ML IJ SOLN: 0.2500 mg | INTRAMUSCULAR | Status: DC | PRN

## 2019-04-19 MED ORDER — LIDOCAINE HCL 1 % IJ SOLN
INTRAMUSCULAR | Status: DC | PRN
  Administered 2019-04-19: 50 mg via INTRADERMAL

## 2019-04-19 MED ORDER — PROMETHAZINE HCL 25 MG/ML IJ SOLN: 6.2500 mg | INTRAMUSCULAR | Status: DC | PRN

## 2019-04-19 MED ORDER — MIDAZOLAM HCL 2 MG/2ML IJ SOLN: 0.5000 mg | Freq: Once | INTRAMUSCULAR | Status: DC | PRN

## 2019-04-19 MED ORDER — PROPOFOL 10 MG/ML IV BOLUS
INTRAVENOUS | Status: DC | PRN
  Administered 2019-04-19 (×3): 20 mg via INTRAVENOUS

## 2019-04-19 NOTE — Transfer of Care (Signed)
Immediate Anesthesia Transfer of Care Note  Patient: Taylor Williams  Procedure(s) Performed: ESOPHAGOGASTRODUODENOSCOPY (EGD) WITH PROPOFOL (N/A ) BIOPSY  Patient Location: PACU  Anesthesia Type:General  Level of Consciousness: awake  Airway & Oxygen Therapy: Patient Spontanous Breathing  Post-op Assessment: Report given to RN  Post vital signs: Reviewed  Last Vitals:  Vitals Value Taken Time  BP    Temp    Pulse 78 04/19/19 0759  Resp    SpO2 99 % 04/19/19 0759  Vitals shown include unvalidated device data.  Last Pain:  Vitals:   04/19/19 0735  PainSc: 0-No pain         Complications: No apparent anesthesia complications

## 2019-04-19 NOTE — Anesthesia Postprocedure Evaluation (Signed)
Anesthesia Post Note  Patient: Ever Halberg  Procedure(s) Performed: ESOPHAGOGASTRODUODENOSCOPY (EGD) WITH PROPOFOL (N/A ) BIOPSY  Patient location during evaluation: PACU Anesthesia Type: General Level of consciousness: awake and alert Pain management: pain level controlled Vital Signs Assessment: post-procedure vital signs reviewed and stable Respiratory status: spontaneous breathing Cardiovascular status: blood pressure returned to baseline and stable Postop Assessment: no apparent nausea or vomiting Anesthetic complications: no     Last Vitals:  Vitals:   04/19/19 0700  BP: (!) 114/57  Pulse: 68  Resp: 18  Temp: (!) 36.3 C  SpO2: 99%    Last Pain:  Vitals:   04/19/19 0735  PainSc: 0-No pain                 Talyia Allende

## 2019-04-19 NOTE — Anesthesia Preprocedure Evaluation (Signed)
Anesthesia Evaluation  Patient identified by MRN, date of birth, ID band Patient awake    Reviewed: Allergy & Precautions, NPO status , Patient's Chart, lab work & pertinent test results  History of Anesthesia Complications (+) AWARENESS UNDER ANESTHESIA and history of anesthetic complications  Airway Mallampati: II  TM Distance: >3 FB Neck ROM: Full    Dental no notable dental hx. (+) Edentulous Upper, Edentulous Lower   Pulmonary neg pulmonary ROS, Current SmokerPatient did not abstain from smoking.,    Pulmonary exam normal breath sounds clear to auscultation       Cardiovascular Exercise Tolerance: Good hypertension, Pt. on medications negative cardio ROS Normal cardiovascular examI Rhythm:Regular Rate:Normal     Neuro/Psych Anxiety Depression negative neurological ROS  negative psych ROS   GI/Hepatic negative GI ROS, (+) Cirrhosis       ,   Endo/Other  negative endocrine ROSdiabetes  Renal/GU negative Renal ROS  negative genitourinary   Musculoskeletal negative musculoskeletal ROS (+)   Abdominal   Peds negative pediatric ROS (+)  Hematology negative hematology ROS (+) anemia ,   Anesthesia Other Findings   Reproductive/Obstetrics negative OB ROS                             Anesthesia Physical Anesthesia Plan  ASA: III  Anesthesia Plan: General   Post-op Pain Management:    Induction: Intravenous  PONV Risk Score and Plan: 2 and Propofol infusion, TIVA and Treatment may vary due to age or medical condition  Airway Management Planned: Simple Face Mask and Nasal Cannula  Additional Equipment:   Intra-op Plan:   Post-operative Plan:   Informed Consent: I have reviewed the patients History and Physical, chart, labs and discussed the procedure including the risks, benefits and alternatives for the proposed anesthesia with the patient or authorized representative who  has indicated his/her understanding and acceptance.     Dental advisory given  Plan Discussed with: CRNA  Anesthesia Plan Comments: (Plan Full PPE use  Plan GA with GETA as needed d/w pt -WTP with same after Q&A)        Anesthesia Quick Evaluation

## 2019-04-19 NOTE — Discharge Instructions (Signed)
No aspirin or NSAIDs for 24 hours. Resume usual medications and diet as before. Pantoprazole 40 mg by mouth 30 minutes before breakfast daily. No driving for 24 hours. Physician will call with results of biopsy and blood test.   Upper Endoscopy, Adult, Care After This sheet gives you information about how to care for yourself after your procedure. Your health care provider may also give you more specific instructions. If you have problems or questions, contact your health care provider. What can I expect after the procedure? After the procedure, it is common to have:  A sore throat.  Mild stomach pain or discomfort.  Bloating.  Nausea. Follow these instructions at home:   Follow instructions from your health care provider about what to eat or drink after your procedure.  Return to your normal activities as told by your health care provider. Ask your health care provider what activities are safe for you.  Take over-the-counter and prescription medicines only as told by your health care provider.  Do not drive for 24 hours if you were given a sedative during your procedure.  Keep all follow-up visits as told by your health care provider. This is important. Contact a health care provider if you have:  A sore throat that lasts longer than one day.  Trouble swallowing. Get help right away if:  You vomit blood or your vomit looks like coffee grounds.  You have: ? A fever. ? Bloody, black, or tarry stools. ? A severe sore throat or you cannot swallow. ? Difficulty breathing. ? Severe pain in your chest or abdomen. Summary  After the procedure, it is common to have a sore throat, mild stomach discomfort, bloating, and nausea.  Do not drive for 24 hours if you were given a sedative during the procedure.  Follow instructions from your health care provider about what to eat or drink after your procedure.  Return to your normal activities as told by your health care  provider. This information is not intended to replace advice given to you by your health care provider. Make sure you discuss any questions you have with your health care provider. Document Released: 01/14/2012 Document Revised: 01/06/2018 Document Reviewed: 12/15/2017 Elsevier Patient Education  2020 [employer] After These instructions provide you with information about caring for yourself after your procedure. Your health care provider may also give you more specific instructions. Your treatment has been planned according to current medical practices, but problems sometimes occur. Call your health care provider if you have any problems or questions after your procedure. What can I expect after the procedure? After your procedure, you may:  Feel sleepy for several hours.  Feel clumsy and have poor balance for several hours.  Feel forgetful about what happened after the procedure.  Have poor judgment for several hours.  Feel nauseous or vomit.  Have a sore throat if you had a breathing tube during the procedure. Follow these instructions at home: For at least 24 hours after the procedure:      Have a responsible adult stay with you. It is important to have someone help care for you until you are awake and alert.  Rest as needed.  Do not: ? Participate in activities in which you could fall or become injured. ? Drive. ? Use heavy machinery. ? Drink alcohol. ? Take sleeping pills or medicines that cause drowsiness. ? Make important decisions or sign legal documents. ? Take care of children on your  own. Eating and drinking  Follow the diet that is recommended by your health care provider.  If you vomit, drink water, juice, or soup when you can drink without vomiting.  Make sure you have little or no nausea before eating solid foods. General instructions  Take over-the-counter and prescription medicines only as told by your health  care provider.  If you have sleep apnea, surgery and certain medicines can increase your risk for breathing problems. Follow instructions from your health care provider about wearing your sleep device: ? Anytime you are sleeping, including during daytime naps. ? While taking prescription pain medicines, sleeping medicines, or medicines that make you drowsy.  If you smoke, do not smoke without supervision.  Keep all follow-up visits as told by your health care provider. This is important. Contact a health care provider if:  You keep feeling nauseous or you keep vomiting.  You feel light-headed.  You develop a rash.  You have a fever. Get help right away if:  You have trouble breathing. Summary  For several hours after your procedure, you may feel sleepy and have poor judgment.  Have a responsible adult stay with you for at least 24 hours or until you are awake and alert. This information is not intended to replace advice given to you by your health care provider. Make sure you discuss any questions you have with your health care provider. Document Released: 11/05/2015 Document Revised: 10/13/2017 Document Reviewed: 11/05/2015 Elsevier Patient Education  2020 [employer].

## 2019-04-19 NOTE — Op Note (Signed)
[facility] Patient Name: Taylor Williams Procedure Date: 04/19/2019 7:07 AM MRN: [id number] Date of Birth: [birth year] Attending MD: Hildred Laser , MD CSN: [id number] Age: 68 Admit Type: Outpatient Procedure:                Upper GI endoscopy Indications:              Cirrhosis rule out esophageal varices Providers:                Hildred Laser, MD, Otis Peak B. Sharon Seller, RN, Randa Spike, Technician Referring MD:             Stoney Bang, MD Medicines:                Propofol per Anesthesia Complications:            No immediate complications. Estimated Blood Loss:     Estimated blood loss was minimal. Procedure:                Pre-Anesthesia Assessment:                           - Prior to the procedure, a History and Physical                            was performed, and patient medications and                            allergies were reviewed. The patient's tolerance of                            previous anesthesia was also reviewed. The risks                            and benefits of the procedure and the sedation                            options and risks were discussed with the patient.                            All questions were answered, and informed consent                            was obtained. Prior Anticoagulants: The patient has                            taken no previous anticoagulant or antiplatelet                            agents. ASA Grade Assessment: III - A patient with                            severe systemic disease. After reviewing the risks  and benefits, the patient was deemed in                            satisfactory condition to undergo the procedure.                           After obtaining informed consent, the endoscope was                            passed under direct vision. Throughout the                            procedure, the patient's blood pressure, pulse, and                             oxygen saturations were monitored continuously. The                            GIF-H190 ([id number]) scope was introduced through the                            mouth, and advanced to the second part of duodenum.                            The upper GI endoscopy was accomplished without                            difficulty. The patient tolerated the procedure                            well. The upper GI endoscopy was accomplished                            without difficulty. The patient tolerated the                            procedure well. Scope In: 7:41:30 AM Scope Out: 7:49:03 AM Total Procedure Duration: 0 hours 7 minutes 33 seconds  Findings:      The proximal esophagus and mid esophagus were normal.      LA Grade B (one or more mucosal breaks greater than 5 mm, not extending       between the tops of two mucosal folds) esophagitis with no bleeding was       found 36 to 37 cm from the incisors.      The Z-line was irregular and was found 37 cm from the incisors. This was       biopsied with a cold forceps for histology.      A 2 cm hiatal hernia was present.      Moderate portal hypertensive gastropathy was found in the entire       examined stomach.      Multiple erosions without bleeding were found in the duodenal bulb.      The post-bulbar duodenum was normal. Impression:               - Normal proximal esophagus  and mid esophagus.                           - LA Grade B reflux esophagitis.                           - Z-line irregular, 37 cm from the incisors.                            Biopsied.                           - 2 cm hiatal hernia.                           - Portal hypertensive gastropathy.                           - Duodenal erosions without bleeding.                           - Normal D2. Moderate Sedation:      Per Anesthesia Care Recommendation:           - Patient has a contact number available for                            emergencies. The  signs and symptoms of potential                            delayed complications were discussed with the                            patient. Return to normal activities tomorrow.                            Written discharge instructions were provided to the                            patient.                           - Resume previous diet today.                           - Continue present medications.                           - No aspirin, ibuprofen, naproxen, or other                            non-steroidal anti-inflammatory drugs for 1 day.                           - H.Pylori serology.                           - Await pathology results.                           -  Return to GI clinic in 3 months. Procedure Code(s):        --- Professional ---                           [phone], Esophagogastroduodenoscopy, flexible,                            transoral; with biopsy, single or multiple Diagnosis Code(s):        --- Professional ---                           K21.0, Gastro-esophageal reflux disease with                            esophagitis                           K22.8, Other specified diseases of esophagus                           K44.9, Diaphragmatic hernia without obstruction or                            gangrene                           K76.6, Portal hypertension                           K31.89, Other diseases of stomach and duodenum                           K26.9, Duodenal ulcer, unspecified as acute or                            chronic, without hemorrhage or perforation                           K74.60, Unspecified cirrhosis of liver CPT copyright 2019 American Medical Association. All rights reserved. The codes documented in this report are preliminary and upon coder review may  be revised to meet current compliance requirements. Hildred Laser, MD Hildred Laser, MD 04/19/2019 8:02:51 AM This report has been signed electronically. Number of Addenda: 0

## 2019-04-19 NOTE — H&P (Signed)
Taylor Williams is an 68 y.o. female.   Chief Complaint: Patient is here for EGD. HPI: Patient is 68 year old Caucasian female with history of diabetes mellitus who was recently diagnosed with stage IV cirrhosis secondary to [facility] by liver biopsy.  She is here to undergo esophagogastroduodenoscopy to screen and band esophageal varices if these are significant in size. Patient denies nausea vomiting hematemesis abdominal pain or melena.  Past Medical History:  Diagnosis Date  . Anemia   . Anxiety   . Breast cancer ([place])   . Complication of anesthesia    Recall during general anesthesia/ankle surgery/ 'light weight'  very sensitive to narcotics  . Depression   . Diabetes mellitus without complication ([place])   . High cholesterol   . Hypertension     Past Surgical History:  Procedure Laterality Date  . BREAST LUMPECTOMY Left    states for ductal carcinoma, had surgery in late may  . COLONOSCOPY    . ENDOVENOUS ABLATION SAPHENOUS VEIN W/ LASER Right 04/15/2018   endovenous laser ablation R GSV by Ruta Hinds MD   . ENDOVENOUS ABLATION SAPHENOUS VEIN W/ LASER Left 05/13/2018   endovenous laser ablation left greater saphenous vein by Ruta Hinds MD   . galbladder    . GANGLION CYST EXCISION    . TONSILLECTOMY AND ADENOIDECTOMY      Family History  Problem Relation Age of Onset  . Cancer - Other Mother   . Hypertension Father   . Diabetes Father   . Dementia Father   . Hyperthyroidism Brother   . Cancer Paternal Grandmother   . Cancer Paternal Grandfather    Social History:  reports that she has been smoking. She started smoking about 53 years ago. She has a 40.00 pack-year smoking history. She has never used smokeless tobacco. She reports that she does not drink alcohol or use drugs.  Allergies:  Allergies  Allergen Reactions  . Aspirin Swelling    Facial swelling and panic attack when she took pain medication that contained aspirin  . Other Swelling    novacaine - patient  states reaction happened as a child and she believes it caused facial swelling, mother always told her the MD said never take again  . Fish Allergy Other (See Comments)    gagging  . Procaine     Medications Prior to Admission  Medication Sig Dispense Refill  . ALPRAZolam (XANAX) 0.25 MG tablet Take 0.25 mg by mouth 2 (two) times daily.    . Biotin w/ Vitamins C & E (HAIR/SKIN/NAILS PO) Take 1-2 capsules by mouth See admin instructions. 2 caps in the morning, 1 cap at bedtime    . cetirizine (ZYRTEC) 10 MG tablet Take 10 mg by mouth at bedtime.     . citalopram (CELEXA) 20 MG tablet Take 20 mg by mouth daily.    . dapagliflozin propanediol (FARXIGA) 5 MG TABS tablet Take 5 mg by mouth daily.    Marland Kitchen doxycycline (MONODOX) 50 MG capsule Take 50 mg by mouth daily.    . furosemide (LASIX) 20 MG tablet Take 1 tablet (20 mg total) by mouth daily. 90 tablet 3  . glipiZIDE (GLUCOTROL) 10 MG tablet Take 10 mg by mouth 2 (two) times daily before a meal.    . hydrocortisone 2.5 % cream Apply 1 application topically at bedtime.     . insulin glargine (LANTUS) 100 UNIT/ML injection Inject 35 Units into the skin at bedtime.     Marland Kitchen ketoconazole (NIZORAL) 2 % cream  Apply 1 application topically daily.     Javier Docker Oil 500 MG CAPS Take 1,500 mg by mouth 2 (two) times daily.    Marland Kitchen lisinopril (PRINIVIL,ZESTRIL) 5 MG tablet Take 5 mg by mouth daily.    Marland Kitchen loperamide (IMODIUM A-D) 2 MG tablet Take 2-4 mg by mouth 4 (four) times daily as needed for diarrhea or loose stools.     . metFORMIN (GLUCOPHAGE) 1000 MG tablet Take 1,000 mg by mouth 2 (two) times daily with a meal.    . potassium chloride SA (K-DUR,KLOR-CON) 20 MEQ tablet Take 1 tablet (20 mEq total) by mouth daily. 90 tablet 3  . sitaGLIPtin (JANUVIA) 100 MG tablet Take 100 mg by mouth daily.    Marland Kitchen spironolactone (ALDACTONE) 25 MG tablet Take 1 tablet (25 mg total) by mouth daily. 30 tablet 0  . venlafaxine XR (EFFEXOR-XR) 75 MG 24 hr capsule Take 75 mg by mouth  2 (two) times daily.    . naproxen sodium (ALEVE) 220 MG tablet Take 220-440 mg by mouth 2 (two) times daily as needed (pain).      Results for orders placed or performed during the hospital encounter of 04/19/19 (from the past 48 hour(s))  Glucose, capillary     Status: Abnormal   Collection Time: 04/19/19  6:55 AM  Result Value Ref Range   Glucose-Capillary 149 (H) 70 - 99 mg/dL   No results found.  ROS  Blood pressure (!) 114/57, pulse 68, temperature (!) 97.4 F (36.3 C), resp. rate 18, SpO2 99 %. Physical Exam  Constitutional: She appears well-developed and well-nourished.  HENT:  Mouth/Throat: Oropharynx is clear and moist.  Eyes: Conjunctivae are normal. No scleral icterus.  Neck: No thyromegaly present.  Cardiovascular: Normal rate, regular rhythm and normal heart sounds.  No murmur heard. Respiratory: Effort normal and breath sounds normal.  GI:  Abdomen is full but soft and nontender with organomegaly or masses.  Musculoskeletal:        General: No edema.  Lymphadenopathy:    She has no cervical adenopathy.  Neurological: She is alert.  Skin: Skin is warm and dry.     Assessment/Plan Stage IV cirrhosis secondary to NASH. Esophagogastroduodenoscopy to screen for and band esophageal varices.  Hildred Laser, MD 04/19/2019, 7:31 AM

## 2019-04-20 LAB — H. PYLORI ANTIBODY, IGG: H Pylori IgG: 4.1 Index Value — ABNORMAL HIGH (ref 0.00–0.79)

## 2019-04-20 LAB — SURGICAL PATHOLOGY

## 2019-04-22 ENCOUNTER — Other Ambulatory Visit (INDEPENDENT_AMBULATORY_CARE_PROVIDER_SITE_OTHER): Payer: Self-pay | Admitting: Internal Medicine

## 2019-04-22 ENCOUNTER — Encounter (HOSPITAL_COMMUNITY): Payer: Self-pay | Admitting: Internal Medicine

## 2019-04-22 MED ORDER — PYLERA 140-125-125 MG PO CAPS: 3.0000 | ORAL_CAPSULE | Freq: Three times a day (TID) | ORAL | 0 refills | Status: DC

## 2019-04-22 NOTE — Progress Notes (Signed)
pylera

## 2019-05-01 NOTE — Progress Notes (Signed)
Subjective:    Patient ID: Taylor Williams, female    DOB: 09-May-1951, 68 y.o.   MRN: [id number]  HPI Taylor Williams is a 68 y.o. female with a past medical history significant for DM II, HTN, anemia, hyperlipidemia, anxiety, depression and breast cancer stage 1a s/p left breast lumpectomy 11/30/2015 s/p 4 cycles of TC 01/12/2016 - 03/15/2016 and bilateral pulmonary nodules under CT surveillance. She was recently diagnosed with NASH cirrhosis confirmed by liver biopsy.  She underwent a surveillance EGD 04/19/2019 which showed reflux esophagitis, no evidence of esophageal or gastric varices. H. Pylori serology was positive. She was prescribed Pylera 3 tabs qid x 10 days. Today is day # 5.  She was also prescribed pantoprazole 14m po once daily. She complains of feeling dizzy since starting Pantoprazole. She vomited bilious emesis twice daily for 3 or 4 days prior to starting Pylera.  She continues to have some nausea but no further vomiting.  Is passing a normal brown bowel movement daily, however, her stool is darker since starting Pylera.  No rectal bleeding.  EGD 04/19/2019 by Dr. RLaural Golden  - Normal proximal esophagus and mid esophagus. - LA Grade B reflux esophagitis. - Z-line irregular, 37 cm from the incisors. Biopsied. - 2 cm hiatal hernia. - Portal hypertensive gastropathy. - Duodenal erosions without bleeding. - Normal D2.    Liver Biopsy 03/17/2019:  Liver, needle/core biopsy, Random Right Lobe - MODERATELY ACTIVE STEATOHEPATITIS (GRADE 2 OF 3) WITH CIRRHOSIS (STAGE 4 OF 4) The findings are that of moderately active steatohepatitis, alcoholic and/or non-alcoholic, with cirrhosis. Patients elevated antimitochondrial antibody titer is noted but the histologic findings are not supportive of primary biliary cholangitis  Labs 03/05/2019: Na 136. K 3.8. AST 119. ALT 99. Alk phos 99. T. Bili 0.9. Chol 208. LDL 139. Iron 81. Ferritin 27. Ceruloplasmin 21.7. Elevated IgG 1,739. A1AT 174. Elevated AMA 42.5.  INR 1.0. Hep A IgM and total ab negative. Hep C antibody negative.  Hep B surface antibody < 3.1. Hep B core total negative. Hep B surface ag not done as error. SMA was ordered not done in error by lab. She was started on Spironolactone 265mQD and her LE edema has significantly improved.   An abdominal sonogram 03/05/2019: identified cirrhosis, mild splenomegaly and past cholecystectomy.  Previously requested colonoscopy records from Dr. CaSheron Nightingaleffice received. Colonoscopy 04/10/2017: A 5 to 9 mm polypoid pedunculated tubular adenomatous polyp was removed at 130 cm from point of entry and a polypoid pedunculated tubular adenomatous polyp was removed at 70 cm.  Past Medical History:  Diagnosis Date  . Anemia   . Anxiety   . Breast cancer ([place]  . Complication of anesthesia    Recall during general anesthesia/ankle surgery/ 'light weight'  very sensitive to narcotics  . Depression   . [place] mellitus [place] complication [place]  . High cholesterol   . Hypertension    Past Surgical History:  Procedure Laterality Date  . BIOPSY  04/19/2019   Procedure: BIOPSY;  Surgeon: ReRogene HoustonMD;  Location: [facility];  Service: Endoscopy;;  esophageal  . BREAST LUMPECTOMY Left    states for ductal carcinoma, had surgery in late may  . COLONOSCOPY    . ENDOVENOUS ABLATION SAPHENOUS VEIN W/ LASER Right 04/15/2018   endovenous laser ablation R GSV by ChRuta HindsD   . ENDOVENOUS ABLATION SAPHENOUS VEIN W/ LASER Left 05/13/2018   endovenous laser ablation left greater saphenous vein by ChRuta HindsD   .  ESOPHAGOGASTRODUODENOSCOPY (EGD) WITH PROPOFOL N/A 04/19/2019   Procedure: ESOPHAGOGASTRODUODENOSCOPY (EGD) WITH PROPOFOL;  Surgeon: Rogene Houston, MD;  Location: [facility];  Service: Endoscopy;  Laterality: N/A;  7:30  . galbladder    . GANGLION CYST EXCISION    . TONSILLECTOMY AND ADENOIDECTOMY     Current Outpatient Medications on File Prior to Visit  Medication Sig  Dispense Refill  . ALPRAZolam (XANAX) 0.25 MG tablet Take 0.25 mg by mouth 2 (two) times daily.    Marland Kitchen bismuth-metronidazole-tetracycline (PYLERA) 140-125-125 MG capsule Take 3 capsules by mouth 4 (four) times daily -  before meals and at bedtime. 120 capsule 0  . cetirizine (ZYRTEC) 10 MG tablet Take 10 mg by mouth at bedtime.     . citalopram (CELEXA) 20 MG tablet Take 20 mg by mouth daily.    . dapagliflozin propanediol (FARXIGA) 5 MG TABS tablet Take 5 mg by mouth daily.    Marland Kitchen doxycycline (MONODOX) 50 MG capsule Take 50 mg by mouth daily.    . furosemide (LASIX) 20 MG tablet Take 1 tablet (20 mg total) by mouth daily. 90 tablet 3  . glipiZIDE (GLUCOTROL) 10 MG tablet Take 10 mg by mouth 2 (two) times daily before a meal.    . hydrocortisone 2.5 % cream Apply 1 application topically at bedtime.     . insulin glargine (LANTUS) 100 UNIT/ML injection Inject 35 Units into the skin at bedtime.     Marland Kitchen ketoconazole (NIZORAL) 2 % cream Apply 1 application topically daily.     Javier Docker Oil 500 MG CAPS Take 1,500 mg by mouth 2 (two) times daily.    Marland Kitchen lisinopril (PRINIVIL,ZESTRIL) 5 MG tablet Take 5 mg by mouth daily.    Marland Kitchen loperamide (IMODIUM A-D) 2 MG tablet Take 2-4 mg by mouth 4 (four) times daily as needed for diarrhea or loose stools.     . metFORMIN (GLUCOPHAGE) 1000 MG tablet Take 1,000 mg by mouth 2 (two) times daily with a meal.    . naproxen sodium (ALEVE) 220 MG tablet Take 220-440 mg by mouth 2 (two) times daily as needed (pain).    . pantoprazole (PROTONIX) 40 MG tablet Take 1 tablet (40 mg total) by mouth daily before breakfast. 30 tablet 5  . potassium chloride SA (K-DUR,KLOR-CON) 20 MEQ tablet Take 1 tablet (20 mEq total) by mouth daily. 90 tablet 3  . sitaGLIPtin (JANUVIA) 100 MG tablet Take 100 mg by mouth daily.    Marland Kitchen spironolactone (ALDACTONE) 25 MG tablet Take 1 tablet (25 mg total) by mouth daily. 30 tablet 0  . venlafaxine XR (EFFEXOR-XR) 75 MG 24 hr capsule Take 75 mg by mouth 2 (two)  times daily.     No current facility-administered medications on file prior to visit.    Allergies  Allergen Reactions  . Aspirin Swelling    Facial swelling and panic attack when she took pain medication that contained aspirin  . Other Swelling    novacaine - patient states reaction happened as a child and she believes it caused facial swelling, mother always told her the MD said never take again  . Fish Allergy Other (See Comments)    gagging  . Procaine    Review of Systems  See HPI, all other systems reviewed and are negative     Objective:   Physical Exam  Blood pressure 122/76, pulse 71, temperature 97.7 F (36.5 C), temperature source Oral, height 5' 8"  (1.727 m), weight 215 lb 1.6 oz (97.6  kg).  General: 68 year old female in no acute distress Eyes: Sclera nonicteric, conjunctiva pink Heart: Regular rate and rhythm, no murmurs Lungs: Breath sounds clear throughout.  Abdomen: Soft, nontender, no masses or organomegaly. Extremities: 1+ lower extremity edema Neuro: Alert and oriented x4, no asterixis    Assessment & Plan:   1. NASH Cirrhosis -hepatic panel, AFP, Hep B surface antigen, SMA to complete hepatic serologies -patient requires Hep A and hep B vaccinations, to be completed by her PCP -Surveillance abdominal sonogram due 08/2019 -2 g low-sodium diet, recommended consult with nutritionist, patient will discuss with her PCP -No alcohol -No NSAIDs  2. Reflux esophagitis -Stop pantoprazole which resulted in side effect of dizziness -Start omeprazole 20 mg 1 p.o. daily, patient will call me if she develops dizziness on this as well  3. Positive H. Pylori IgG Ab -Continue Pylera treatment -Patient will call our office in 2 weeks after she completes the Pylera treatment and an H. pylori breath test will be ordered at that time.  Note she will need to be off all PPI/acid reducing medications for least 2 weeks prior to having the breath test completed.  4.  History  of colon polyps.  Last colonoscopy 04/10/2017 by Dr. Ladona Horns, two  5 to 9 mm tubular adenomatous polyps were removed -Dr. Dorien Chihuahua to verify colonoscopy recall date

## 2019-05-03 ENCOUNTER — Other Ambulatory Visit: Payer: Self-pay

## 2019-05-03 ENCOUNTER — Ambulatory Visit (INDEPENDENT_AMBULATORY_CARE_PROVIDER_SITE_OTHER): Payer: Medicare Other | Admitting: Nurse Practitioner

## 2019-05-03 ENCOUNTER — Encounter (INDEPENDENT_AMBULATORY_CARE_PROVIDER_SITE_OTHER): Payer: Self-pay | Admitting: Nurse Practitioner

## 2019-05-03 DIAGNOSIS — B9681 Helicobacter pylori [H. pylori] as the cause of diseases classified elsewhere: Secondary | ICD-10-CM | POA: Diagnosis not present

## 2019-05-03 DIAGNOSIS — F411 Generalized anxiety disorder: Secondary | ICD-10-CM | POA: Diagnosis not present

## 2019-05-03 DIAGNOSIS — E1165 Type 2 diabetes mellitus with hyperglycemia: Secondary | ICD-10-CM | POA: Diagnosis not present

## 2019-05-03 DIAGNOSIS — Z1389 Encounter for screening for other disorder: Secondary | ICD-10-CM | POA: Diagnosis not present

## 2019-05-03 DIAGNOSIS — K2970 Gastritis, unspecified, without bleeding: Secondary | ICD-10-CM

## 2019-05-03 DIAGNOSIS — I10 Essential (primary) hypertension: Secondary | ICD-10-CM | POA: Diagnosis not present

## 2019-05-03 DIAGNOSIS — Z0000 Encounter for general adult medical examination without abnormal findings: Secondary | ICD-10-CM | POA: Diagnosis not present

## 2019-05-03 DIAGNOSIS — K7469 Other cirrhosis of liver: Secondary | ICD-10-CM | POA: Diagnosis not present

## 2019-05-03 DIAGNOSIS — K7460 Unspecified cirrhosis of liver: Secondary | ICD-10-CM | POA: Diagnosis not present

## 2019-05-03 DIAGNOSIS — R945 Abnormal results of liver function studies: Secondary | ICD-10-CM | POA: Diagnosis not present

## 2019-05-03 DIAGNOSIS — R7989 Other specified abnormal findings of blood chemistry: Secondary | ICD-10-CM | POA: Diagnosis not present

## 2019-05-03 DIAGNOSIS — F3289 Other specified depressive episodes: Secondary | ICD-10-CM | POA: Diagnosis not present

## 2019-05-03 MED ORDER — OMEPRAZOLE 20 MG PO CPDR: 20.0000 mg | DELAYED_RELEASE_CAPSULE | Freq: Every day | ORAL | 1 refills | Status: DC

## 2019-05-03 NOTE — Patient Instructions (Addendum)
1.  Complete the provided lab order 1 or 2 weeks after you finish your H. pylori treatment  2.  Repeat an abdominal ultrasound every 6 months, next ultrasound due February 2021  3.  Stop pantoprazole.  Start omeprazole 20 mg once daily on Wednesday, 05/05/2019.  If you continue to have dizziness stop the omeprazole and call our office.  4.  Continue your Pylera treatment as previously prescribed by Dr. Laural Golden  5.  Call our office 2 weeks after you finish the Pylera treatment and I will order an H. pylori breath test to make sure the H. pylori bacteria has resolved  6.  Follow-up with your primary care physician for hepatitis A and B vaccinations  7.  Follow-up with your primary care physician for your lower extremity edema.  Further diuretics including spironolactone prescription should come from your primary care physician.  8.  Follow-up in our office in 4 months and as needed  9.  Follow a 2 g low-sodium diet, I would recommend consult with a dietitian, this is something else her primary care physician should help facilitate

## 2019-05-31 DIAGNOSIS — Z23 Encounter for immunization: Secondary | ICD-10-CM | POA: Diagnosis not present

## 2019-07-01 DIAGNOSIS — M85851 Other specified disorders of bone density and structure, right thigh: Secondary | ICD-10-CM | POA: Diagnosis not present

## 2019-07-01 DIAGNOSIS — M8588 Other specified disorders of bone density and structure, other site: Secondary | ICD-10-CM | POA: Diagnosis not present

## 2019-07-01 DIAGNOSIS — M810 Age-related osteoporosis without current pathological fracture: Secondary | ICD-10-CM | POA: Diagnosis not present

## 2019-08-18 ENCOUNTER — Other Ambulatory Visit (HOSPITAL_COMMUNITY): Payer: Medicare Other

## 2019-08-19 DIAGNOSIS — Z0000 Encounter for general adult medical examination without abnormal findings: Secondary | ICD-10-CM | POA: Diagnosis not present

## 2019-08-19 DIAGNOSIS — Z1389 Encounter for screening for other disorder: Secondary | ICD-10-CM | POA: Diagnosis not present

## 2019-08-19 DIAGNOSIS — E1165 Type 2 diabetes mellitus with hyperglycemia: Secondary | ICD-10-CM | POA: Diagnosis not present

## 2019-08-19 DIAGNOSIS — R945 Abnormal results of liver function studies: Secondary | ICD-10-CM | POA: Diagnosis not present

## 2019-08-19 DIAGNOSIS — I10 Essential (primary) hypertension: Secondary | ICD-10-CM | POA: Diagnosis not present

## 2019-08-19 DIAGNOSIS — F3289 Other specified depressive episodes: Secondary | ICD-10-CM | POA: Diagnosis not present

## 2019-08-19 DIAGNOSIS — F411 Generalized anxiety disorder: Secondary | ICD-10-CM | POA: Diagnosis not present

## 2019-08-19 DIAGNOSIS — K7469 Other cirrhosis of liver: Secondary | ICD-10-CM | POA: Diagnosis not present

## 2019-08-24 ENCOUNTER — Inpatient Hospital Stay (HOSPITAL_COMMUNITY): Payer: Medicare Other | Attending: Hematology

## 2019-08-24 ENCOUNTER — Encounter (INDEPENDENT_AMBULATORY_CARE_PROVIDER_SITE_OTHER): Payer: Self-pay | Admitting: *Deleted

## 2019-08-24 ENCOUNTER — Inpatient Hospital Stay (HOSPITAL_COMMUNITY): Payer: Medicare Other

## 2019-08-24 ENCOUNTER — Other Ambulatory Visit: Payer: Self-pay

## 2019-08-24 DIAGNOSIS — C50412 Malignant neoplasm of upper-outer quadrant of left female breast: Secondary | ICD-10-CM

## 2019-08-24 LAB — COMPREHENSIVE METABOLIC PANEL
ALT: 49 U/L — ABNORMAL HIGH (ref 0–44)
AST: 59 U/L — ABNORMAL HIGH (ref 15–41)
Albumin: 2.9 g/dL — ABNORMAL LOW (ref 3.5–5.0)
Alkaline Phosphatase: 85 U/L (ref 38–126)
Anion gap: 7 (ref 5–15)
BUN: 9 mg/dL (ref 8–23)
CO2: 24 mmol/L (ref 22–32)
Calcium: 8.8 mg/dL — ABNORMAL LOW (ref 8.9–10.3)
Chloride: 103 mmol/L (ref 98–111)
Creatinine, Ser: 0.55 mg/dL (ref 0.44–1.00)
GFR calc Af Amer: >60 mL/min (ref >60)
GFR calc non Af Amer: >60 mL/min (ref >60)
Glucose, Bld: 265 mg/dL — ABNORMAL HIGH (ref 70–99)
Potassium: 4 mmol/L (ref 3.5–5.1)
Sodium: 134 mmol/L — ABNORMAL LOW (ref 135–145)
Total Bilirubin: 0.8 mg/dL (ref 0.3–1.2)
Total Protein: 6.8 g/dL (ref 6.5–8.1)

## 2019-08-24 LAB — CBC WITH DIFFERENTIAL/PLATELET
Abs Immature Granulocytes: 0.01 10*3/uL (ref 0.00–0.07)
Basophils Absolute: 0 10*3/uL (ref 0.0–0.1)
Basophils Relative: 1 %
Eosinophils Absolute: 0.1 10*3/uL (ref 0.0–0.5)
Eosinophils Relative: 3 %
HCT: 40.3 % (ref 36.0–46.0)
Hemoglobin: 13.1 g/dL (ref 12.0–15.0)
Immature Granulocytes: 0 %
Lymphocytes Relative: 21 %
Lymphs Abs: 0.9 10*3/uL (ref 0.7–4.0)
MCH: 30 pg (ref 26.0–34.0)
MCHC: 32.5 g/dL (ref 30.0–36.0)
MCV: 92.4 fL (ref 80.0–100.0)
Monocytes Absolute: 0.4 10*3/uL (ref 0.1–1.0)
Monocytes Relative: 9 %
Neutro Abs: 2.8 10*3/uL (ref 1.7–7.7)
Neutrophils Relative %: 66 %
Platelets: 167 10*3/uL (ref 150–400)
RBC: 4.36 MIL/uL (ref 3.87–5.11)
RDW: 15.3 % (ref 11.5–15.5)
WBC: 4.2 10*3/uL (ref 4.0–10.5)
nRBC: 0 % (ref 0.0–0.2)

## 2019-08-25 ENCOUNTER — Ambulatory Visit (HOSPITAL_COMMUNITY): Payer: Medicare Other | Admitting: Hematology

## 2019-08-31 ENCOUNTER — Inpatient Hospital Stay (HOSPITAL_COMMUNITY): Payer: Medicare Other | Attending: Hematology | Admitting: Nurse Practitioner

## 2019-08-31 ENCOUNTER — Other Ambulatory Visit (HOSPITAL_COMMUNITY): Payer: Self-pay | Admitting: Nurse Practitioner

## 2019-08-31 DIAGNOSIS — C50412 Malignant neoplasm of upper-outer quadrant of left female breast: Secondary | ICD-10-CM | POA: Diagnosis not present

## 2019-08-31 DIAGNOSIS — R918 Other nonspecific abnormal finding of lung field: Secondary | ICD-10-CM | POA: Diagnosis not present

## 2019-08-31 NOTE — Assessment & Plan Note (Signed)
1.  Stage Ia (T1BN0M0) triple negative left breast cancer: -Status post left lumpectomy and SLNB by Dr. Ladona Horns on 11/30/2015, 0.8 cm, ER/PR/HER-2 negative, 2 sentinel lymph nodes negative, no LVI, Ki 67 92%. -Status post 4 cycles of TC from 01/12/2016 through 03/15/2016, followed by radiation. -Last mammogram on 01/19/2019 was BI-RADS Category 2 negative. -Patient reports lumpectomy scar is still very hard and unchanged from last time.  It does not cause her any pain just very hard.  She still has some mild lymphedema in the nipple area. -Labs done on 08/24/2019 showed hemoglobin 13.1, WBC 4.2, platelets 167. -We will see her back in 6 months with a repeat mammogram and labs.  2.  Lung nodules: -CT of the chest without contrast on 02/12/2019 showed stable right upper lobe nodule measuring 9 x 7 mm.  Stable 5 mm and 4 mm right upper lobe pulmonary nodules.  Scattered mediastinal and hilar lymph nodes are stable. -She is an active smoker smokes 1 pack/day for the past 48 years. -We will see her back in 6 months with a repeat of her CT of her chest.  3.  Elevated liver enzymes: -She has advanced cirrhotic changes involving the liver with portal hypertension and portal venous collaterals. -She was surprised and did not know about it.  However the past 2 CT scans also showed cirrhosis. -She did not seem to have any clinical ascites.  No episodes of hepatic encephalopathy.  No hematemesis or hematochezia. -He does not report any hepatitis in the past.  She worked as a Marine scientist in [employer]  She did not have any history of excessive alcohol abuse.  Likely etiology is fatty liver. -She was diagnosed with NASH cirrhosis -She has been following up with Dr. Melony Overly.  He performed an upper and lower endoscopies.  She was diagnosed with H. pylori, a stomach ulcer, and a hiatal hernia.  She reports she just finished her treatment for her H. pylori and follows up with Dr. Melony Overly September 08, 2019. -Labs done on  08/24/2019 showed her liver enzymes have improved her AST is 59 and ALT is 49.

## 2019-08-31 NOTE — Patient Instructions (Signed)
[facility] at [facility] Discharge Instructions     Thank you for choosing  Cancer Center at [facility] to provide your oncology and hematology care.  To afford each patient quality time with our provider, please arrive at least 15 minutes before your scheduled appointment time.   If you have a lab appointment with the Cancer Center please come in thru the Main Entrance and check in at the main information desk.  You need to re-schedule your appointment should you arrive 10 or more minutes late.  We strive to give you quality time with our providers, and arriving late affects you and other patients whose appointments are after yours.  Also, if you no show three or more times for appointments you may be dismissed from the clinic at the providers discretion.     Again, thank you for choosing [facility].  Our hope is that these requests will decrease the amount of time that you wait before being seen by our physicians.       _____________________________________________________________  Should you have questions after your visit to [facility], please contact our office at [phone] between the hours of 8:00 a.m. and 4:30 p.m.  Voicemails left after 4:00 p.m. will not be returned until the following business day.  For prescription refill requests, have your pharmacy contact our office and allow 72 hours.    Due to Covid, you will need to wear a mask upon entering the hospital. If you do not have a mask, a mask will be given to you at the Main Entrance upon arrival. For doctor visits, patients may have 1 support person with them. For treatment visits, patients can not have anyone with them due to social distancing guidelines and our immunocompromised population.      

## 2019-08-31 NOTE — Progress Notes (Signed)
[place], [place] [address]   CLINIC:  Medical Oncology/Hematology  PCP:  [name], MD [place] [address] [phone]   REASON FOR VISIT: Follow-up for breast cancer  CURRENT THERAPY: Surveillance per NCCN guidelines  BRIEF ONCOLOGIC HISTORY:  Oncology History  Carcinoma of upper-outer quadrant of left female breast ([place])  11/30/2015 Surgery   Lumpectomy and sentinel node procedure with Dr. Ladona Horns   11/30/2015 Surgery   Needle localization of the L breast with mammo guidance   11/30/2015 Pathology Results   L breast invasive ductal carcinoma, mod diff, 0.8 cm, focal DCIS, high grade, 2/2 LN negative, no LVI, pT1b, pN0M0, ER- PR- HER2 - Ki-67 92%   01/12/2016 - 03/15/2016 Chemotherapy   TC x 4 cycles   02/01/2016 Imaging   CT chest screening- Lung-RADS Category 4B, suspicious. Additional imaging evaluation or consultation with pulmonary medicine or thoracic surgery recommended. Bilateral pulmonary nodules, including an inferior right upper lobe spiculated 11 mm nodule.    03/13/2016 Procedure   Port removed by Dr. Ladona Horns due to concerns for infection.   03/14/2016 Procedure   PICC line placed   04/03/2016 PET scan   Majority of visualized pulmonary nodules are too small for PET resolution. Largest nodule in the right upper lobe is likely stable and does not show metabolism above blood pool. Follow-up CT chest without contrast in 3 months is recommended in further evaluation, to document stability, as malignancy cannot be excluded.Hypermetabolic subcutaneous nodule along the medial right shoulder. This the should be amenable to direct Visualization/palpation. Mild hypermetabolism associated with the left breast and left axilla, likely postoperative in etiology. Please correlate clinically. Aortic atherosclerosis and coronary artery calcification.Cirrhosis.      CANCER STAGING: Cancer Staging Carcinoma of  upper-outer quadrant of left female breast [facility]) Staging form: Breast, AJCC 7th Edition - Pathologic stage from 11/30/2015: Stage IA (T1b, N0, cM0) - Signed by Baird Cancer, PA-C on 01/19/2016    INTERVAL HISTORY:  Ms. Pagliarulo 69 y.o. female was called for telephone interview today for follow-up for breast cancer.  Patient reports she has been doing well since her last visit.  She has no complaints at this time.  She has no new lumps or bumps or adenopathy that she has felt. Denies any nausea, vomiting, or diarrhea. Denies any new pains. Had not noticed any recent bleeding such as epistaxis, hematuria or hematochezia. Denies recent chest pain on exertion, shortness of breath on minimal exertion, pre-syncopal episodes, or palpitations. Denies any numbness or tingling in hands or feet. Denies any recent fevers, infections, or recent hospitalizations. Patient reports appetite at 100% and energy level at 100%.  She is eating well maintain her weight at this time.    REVIEW OF SYSTEMS:  Review of Systems  All other systems reviewed and are negative.    PAST MEDICAL/SURGICAL HISTORY:  Past Medical History:  Diagnosis Date  . Anemia   . Anxiety   . Breast cancer ([place])   . Complication of anesthesia    Recall during general anesthesia/ankle surgery/ 'light weight'  very sensitive to narcotics  . Depression   . Diabetes mellitus without complication ([place])   . High cholesterol   . Hypertension    Past Surgical History:  Procedure Laterality Date  . BIOPSY  04/19/2019   Procedure: BIOPSY;  Surgeon: Rogene Houston, MD;  [facility];  Service: Endoscopy;;  esophageal  . BREAST LUMPECTOMY Left  states for ductal carcinoma, had surgery in late may  . COLONOSCOPY    . ENDOVENOUS ABLATION SAPHENOUS VEIN W/ LASER Right 04/15/2018   endovenous laser ablation R GSV by Ruta Hinds MD   . ENDOVENOUS ABLATION SAPHENOUS VEIN W/ LASER Left 05/13/2018   endovenous laser ablation left  greater saphenous vein by Ruta Hinds MD   . ESOPHAGOGASTRODUODENOSCOPY (EGD) WITH PROPOFOL N/A 04/19/2019   Procedure: ESOPHAGOGASTRODUODENOSCOPY (EGD) WITH PROPOFOL;  Surgeon: Rogene Houston, MD;  Location[facility];  Service: Endoscopy;  Laterality: N/A;  7:30  . galbladder    . GANGLION CYST EXCISION    . TONSILLECTOMY AND ADENOIDECTOMY       SOCIAL HISTORY:  Social History   Socioeconomic History  . Marital status: Widowed    Spouse name: Not on file  . Number of children: Not on file  . Years of education: Not on file  . Highest education level: Not on file  Occupational History  . Not on file  Tobacco Use  . Smoking status: Current Every Day Smoker    Packs/day: 1.00    Years: 40.00    Pack years: 40.00    Start date: 10/28/1965  . Smokeless tobacco: Never Used  Substance and Sexual Activity  . Alcohol use: No    Comment: 2 - 3 mixed drink yearly  . Drug use: No  . Sexual activity: Not on file  Other Topics Concern  . Not on file  Social History Narrative  . Not on file   Social Determinants of Health   Financial Resource Strain:   . Difficulty of Paying Living Expenses: Not on file  Food Insecurity:   . Worried About Charity fundraiser in the Last Year: Not on file  . Ran Out of Food in the Last Year: Not on file  Transportation Needs:   . Lack of Transportation (Medical): Not on file  . Lack of Transportation (Non-Medical): Not on file  Physical Activity:   . Days of Exercise per Week: Not on file  . Minutes of Exercise per Session: Not on file  Stress:   . Feeling of Stress : Not on file  Social Connections:   . Frequency of Communication with Friends and Family: Not on file  . Frequency of Social Gatherings with Friends and Family: Not on file  . Attends Religious Services: Not on file  . Active Member of Clubs or Organizations: Not on file  . Attends Archivist Meetings: Not on file  . Marital Status: Not on file  Intimate  Partner Violence:   . Fear of Current or Ex-Partner: Not on file  . Emotionally Abused: Not on file  . Physically Abused: Not on file  . Sexually Abused: Not on file    FAMILY HISTORY:  Family History  Problem Relation Age of Onset  . Cancer - Other Mother   . Hypertension Father   . Diabetes Father   . Dementia Father   . Hyperthyroidism Brother   . Cancer Paternal Grandmother   . Cancer Paternal Grandfather     CURRENT MEDICATIONS:  Outpatient Encounter Medications as of 08/31/2019  Medication Sig  . ALPRAZolam (XANAX) 0.25 MG tablet Take 0.25 mg by mouth 2 (two) times daily.  Marland Kitchen bismuth-metronidazole-tetracycline (PYLERA) 140-125-125 MG capsule Take 3 capsules by mouth 4 (four) times daily -  before meals and at bedtime.  . cetirizine (ZYRTEC) 10 MG tablet Take 10 mg by mouth at bedtime.   . citalopram (  CELEXA) 20 MG tablet Take 20 mg by mouth daily.  . dapagliflozin propanediol (FARXIGA) 5 MG TABS tablet Take 5 mg by mouth daily.  Marland Kitchen doxycycline (MONODOX) 50 MG capsule Take 50 mg by mouth daily.  . furosemide (LASIX) 20 MG tablet Take 1 tablet (20 mg total) by mouth daily.  Marland Kitchen glipiZIDE (GLUCOTROL) 10 MG tablet Take 10 mg by mouth 2 (two) times daily before a meal.  . hydrocortisone 2.5 % cream Apply 1 application topically at bedtime.   . insulin glargine (LANTUS) 100 UNIT/ML injection Inject 35 Units into the skin at bedtime.   Marland Kitchen ketoconazole (NIZORAL) 2 % cream Apply 1 application topically daily.   Javier Docker Oil 500 MG CAPS Take 1,500 mg by mouth 2 (two) times daily.  Marland Kitchen lisinopril (PRINIVIL,ZESTRIL) 5 MG tablet Take 5 mg by mouth daily.  Marland Kitchen loperamide (IMODIUM A-D) 2 MG tablet Take 2-4 mg by mouth 4 (four) times daily as needed for diarrhea or loose stools.   . metFORMIN (GLUCOPHAGE) 1000 MG tablet Take 1,000 mg by mouth 2 (two) times daily with a meal.  . naproxen sodium (ALEVE) 220 MG tablet Take 220-440 mg by mouth 2 (two) times daily as needed (pain).  Marland Kitchen omeprazole  (PRILOSEC) 20 MG capsule Take 1 capsule (20 mg total) by mouth daily.  . pantoprazole (PROTONIX) 40 MG tablet Take 1 tablet (40 mg total) by mouth daily before breakfast.  . potassium chloride SA (K-DUR,KLOR-CON) 20 MEQ tablet Take 1 tablet (20 mEq total) by mouth daily.  . sitaGLIPtin (JANUVIA) 100 MG tablet Take 100 mg by mouth daily.  Marland Kitchen spironolactone (ALDACTONE) 25 MG tablet Take 1 tablet (25 mg total) by mouth daily.  Marland Kitchen venlafaxine XR (EFFEXOR-XR) 75 MG 24 hr capsule Take 75 mg by mouth 2 (two) times daily.   No facility-administered encounter medications on file as of 08/31/2019.    ALLERGIES:  Allergies  Allergen Reactions  . Aspirin Swelling    Facial swelling and panic attack when she took pain medication that contained aspirin  . Other Swelling    novacaine - patient states reaction happened as a child and she believes it caused facial swelling, mother always told her the MD said never take again  . Fish Allergy Other (See Comments)    gagging  . Procaine     Vital signs: -Deferred due to telephone visit  Physical Exam -Deferred due to telephone visit -Patient was alert and oriented over the phone and in no acute distress.  LABORATORY DATA:  I have reviewed the labs as listed.  CBC    Component Value Date/Time   WBC 4.2 08/24/2019 1513   RBC 4.36 08/24/2019 1513   HGB 13.1 08/24/2019 1513   HCT 40.3 08/24/2019 1513   PLT 167 08/24/2019 1513   MCV 92.4 08/24/2019 1513   MCH 30.0 08/24/2019 1513   MCHC 32.5 08/24/2019 1513   RDW 15.3 08/24/2019 1513   LYMPHSABS 0.9 08/24/2019 1513   MONOABS 0.4 08/24/2019 1513   EOSABS 0.1 08/24/2019 1513   BASOSABS 0.0 08/24/2019 1513   CMP Latest Ref Rng & Units 08/24/2019 04/15/2019 03/05/2019  Glucose 70 - 99 mg/dL 265(H) 110(H) 181(H)  BUN 8 - 23 mg/dL 9 9 11   Creatinine 0.44 - 1.00 mg/dL 0.55 0.58 0.58  Sodium 135 - 145 mmol/L 134(L) 138 136  Potassium 3.5 - 5.1 mmol/L 4.0 4.1 3.8  Chloride 98 - 111 mmol/L 103 105 103   CO2 22 - 32 mmol/L 24 26  23  Calcium 8.9 - 10.3 mg/dL 8.8(L) 9.0 9.4  Total Protein 6.5 - 8.1 g/dL 6.8 - 7.8  Total Bilirubin 0.3 - 1.2 mg/dL 0.8 - 0.9  Alkaline Phos 38 - 126 U/L 85 - 99  AST 15 - 41 U/L 59(H) - 119(H)  ALT 0 - 44 U/L 49(H) - 99(H)    All questions were answered to patient's stated satisfaction. Encouraged patient to call with any new concerns or questions before his next visit to the cancer center and we can certain see him sooner, if needed.     ASSESSMENT & PLAN:   Carcinoma of upper-outer quadrant of left female breast ([place]) 1.  Stage Ia (T1BN0M0) triple negative left breast cancer: -Status post left lumpectomy and SLNB by Dr. Ladona Horns on 11/30/2015, 0.8 cm, ER/PR/HER-2 negative, 2 sentinel lymph nodes negative, no LVI, Ki 67 92%. -Status post 4 cycles of TC from 01/12/2016 through 03/15/2016, followed by radiation. -Last mammogram on 01/19/2019 was BI-RADS Category 2 negative. -Patient reports lumpectomy scar is still very hard and unchanged from last time.  It does not cause her any pain just very hard.  She still has some mild lymphedema in the nipple area. -Labs done on 08/24/2019 showed hemoglobin 13.1, WBC 4.2, platelets 167. -We will see her back in 6 months with a repeat mammogram and labs.  2.  Lung nodules: -CT of the chest without contrast on 02/12/2019 showed stable right upper lobe nodule measuring 9 x 7 mm.  Stable 5 mm and 4 mm right upper lobe pulmonary nodules.  Scattered mediastinal and hilar lymph nodes are stable. -She is an active smoker smokes 1 pack/day for the past 48 years. -We will see her back in 6 months with a repeat of her CT of her chest.  3.  Elevated liver enzymes: -She has advanced cirrhotic changes involving the liver with portal hypertension and portal venous collaterals. -She was surprised and did not know about it.  However the past 2 CT scans also showed cirrhosis. -She did not seem to have any clinical ascites.  No episodes of  hepatic encephalopathy.  No hematemesis or hematochezia. -He does not report any hepatitis in the past.  She worked as a Marine scientist in [employer].  She did not have any history of excessive alcohol abuse.  Likely etiology is fatty liver. -She was diagnosed with NASH cirrhosis -She has been following up with Dr. Melony Overly.  He performed an upper and lower endoscopies.  She was diagnosed with H. pylori, a stomach ulcer, and a hiatal hernia.  She reports she just finished her treatment for her H. pylori and follows up with Dr. Melony Overly September 08, 2019. -Labs done on 08/24/2019 showed her liver enzymes have improved her AST is 59 and ALT is 49.      Orders placed this encounter:  Orders Placed This Encounter  Procedures  . MM DIAG BREAST TOMO BILATERAL  . CT Chest Wo Contrast  . Lactate dehydrogenase  . CBC with Differential/Platelet  . Comprehensive metabolic panel  . Vitamin B12  . VITAMIN D 25 Hydroxy (Vit-D Deficiency, Fractures)   I provided 21 minutes of non face-to-face telephone visit time during this encounter, and > 50% was spent counseling as documented under my assessment & plan.   Francene Finders, FNP-C [place] [phone]

## 2019-09-08 ENCOUNTER — Encounter (INDEPENDENT_AMBULATORY_CARE_PROVIDER_SITE_OTHER): Payer: Self-pay | Admitting: *Deleted

## 2019-09-08 ENCOUNTER — Ambulatory Visit (INDEPENDENT_AMBULATORY_CARE_PROVIDER_SITE_OTHER): Payer: Medicare Other | Admitting: Gastroenterology

## 2019-09-08 ENCOUNTER — Other Ambulatory Visit: Payer: Self-pay

## 2019-09-08 ENCOUNTER — Encounter (INDEPENDENT_AMBULATORY_CARE_PROVIDER_SITE_OTHER): Payer: Self-pay | Admitting: Gastroenterology

## 2019-09-08 VITALS — BP 109/56 | HR 74 | Temp 97.3°F | Ht 68.0 in | Wt 219.4 lb

## 2019-09-08 DIAGNOSIS — R600 Localized edema: Secondary | ICD-10-CM | POA: Diagnosis not present

## 2019-09-08 DIAGNOSIS — K7460 Unspecified cirrhosis of liver: Secondary | ICD-10-CM | POA: Diagnosis not present

## 2019-09-08 DIAGNOSIS — K7581 Nonalcoholic steatohepatitis (NASH): Secondary | ICD-10-CM | POA: Diagnosis not present

## 2019-09-08 DIAGNOSIS — Z8619 Personal history of other infectious and parasitic diseases: Secondary | ICD-10-CM | POA: Diagnosis not present

## 2019-09-08 DIAGNOSIS — L719 Rosacea, unspecified: Secondary | ICD-10-CM | POA: Diagnosis not present

## 2019-09-08 DIAGNOSIS — L219 Seborrheic dermatitis, unspecified: Secondary | ICD-10-CM | POA: Diagnosis not present

## 2019-09-08 DIAGNOSIS — L570 Actinic keratosis: Secondary | ICD-10-CM | POA: Diagnosis not present

## 2019-09-08 MED ORDER — FUROSEMIDE 20 MG PO TABS: 20.0000 mg | ORAL_TABLET | Freq: Every day | ORAL | 3 refills | Status: AC

## 2019-09-08 MED ORDER — SPIRONOLACTONE 25 MG PO TABS: 25.0000 mg | ORAL_TABLET | Freq: Every day | ORAL | 0 refills | Status: AC

## 2019-09-08 NOTE — Progress Notes (Signed)
Patient profile: Taylor Williams is a 69 y.o. female seen for evaluation of cirrhosis. Last seen in clinic on 04/2019  History of Present Illness: Taylor Williams is seen today for follow-up of cirrhosis.  She was diagnosed August 2020 when developed issues with elevated liver enzymes in July 2020 as well as cirrhotic appearing liver seen on a chest CT done for follow-up of chemoradiation (completed in 2017).  Work-up with including labs and liver biopsy ultimately diagnosed to NASH cirrhosis.  Symptomatically she is doing fairly well.  She also had H. pylori diagnosed on her endoscopy for varices screening.  She completed her triple therapy.  She does note becoming dizzy with both omeprazole and pantoprazole and she is not currently on any PPI.  She only has heartburn symptoms if she eats foods such as chili.  She did not have epigastric pain, nausea, etc. prior to H. pylori treatment.  She continues to feel well from a GI standpoint.  No nausea vomiting dysphagia or epigastric pain.  She has chronic loose stools due to starting Metformin.  She is currently on Imodium twice a day and has 1 bowel movement a day.  If she only takes 1 Imodium a day she does get some loose stools.  She denies rectal bleeding or melena lower abdominal pain.  Feels her current bowel regimen works very well.  She does have issues with lower extremity edema, worse in the evenings, resolves overnight.  Some occasional feeling of abdominal bloating but no tense ascites.  Wt Readings from Last 3 Encounters:  09/08/19 219 lb 6.4 oz (99.5 kg)  05/03/19 215 lb 1.6 oz (97.6 kg)  04/15/19 211 lb (95.7 kg)     Last Colonoscopy: Colonoscopy 2018-5 to 9 mm polyp 130 cm, additional polyp at 70 cm.  Otherwise normal. Path w/ both Tubular adenomas  Last Endoscopy: September 2020-grade B esophagitis, irregular Z-line, 2 cm hiatal hernia, portal hypertensive gastropathy, duodenal erosions   Past Medical History:  Past Medical History:    Diagnosis Date  . Anemia   . Anxiety   . Breast cancer ([place])   . Complication of anesthesia    Recall during general anesthesia/ankle surgery/ 'light weight'  very sensitive to narcotics  . Depression   . Diabetes mellitus without complication ([place])   . High cholesterol   . Hypertension     Problem List: Patient Active Problem List   Diagnosis Date Noted  . Cirrhosis of liver without ascites ([place]) 03/23/2019  . Hepatic cirrhosis ([place]) 02/23/2019  . Varicose veins of bilateral lower extremities with other complications 21/30/8657  . Solitary pulmonary nodule 07/25/2016  . High blood sugar 02/02/2016  . Diarrhea 01/19/2016  . Carcinoma of upper-outer quadrant of left female breast ([place]) 01/01/2016    Past Surgical History: Past Surgical History:  Procedure Laterality Date  . BIOPSY  04/19/2019   Procedure: BIOPSY;  Surgeon: Rogene Houston, MD;  [facility];  Service: Endoscopy;;  esophageal  . BREAST LUMPECTOMY Left    states for ductal carcinoma, had surgery in late may  . COLONOSCOPY    . ENDOVENOUS ABLATION SAPHENOUS VEIN W/ LASER Right 04/15/2018   endovenous laser ablation R GSV by Ruta Hinds MD   . ENDOVENOUS ABLATION SAPHENOUS VEIN W/ LASER Left 05/13/2018   endovenous laser ablation left greater saphenous vein by Ruta Hinds MD   . ESOPHAGOGASTRODUODENOSCOPY (EGD) WITH PROPOFOL N/A 04/19/2019   Procedure: ESOPHAGOGASTRODUODENOSCOPY (EGD) WITH PROPOFOL;  Surgeon: Rogene Houston, MD;  [facility]  [facility];  Service: Endoscopy;  Laterality: N/A;  7:30  . galbladder    . GANGLION CYST EXCISION    . TONSILLECTOMY AND ADENOIDECTOMY      Allergies: Allergies  Allergen Reactions  . Aspirin Swelling    Facial swelling and panic attack when she took pain medication that contained aspirin  . Other Swelling    novacaine - patient states reaction happened as a child and she believes it caused facial swelling, mother always told her the MD said never  take again  . Fish Allergy Other (See Comments)    gagging  . Procaine       Home Medications:  Current Outpatient Medications:  .  ALPRAZolam (XANAX) 0.25 MG tablet, Take 0.25 mg by mouth 2 (two) times daily., Disp: , Rfl:  .  cetirizine (ZYRTEC) 10 MG tablet, Take 10 mg by mouth at bedtime. , Disp: , Rfl:  .  citalopram (CELEXA) 20 MG tablet, Take 20 mg by mouth daily., Disp: , Rfl:  .  dapagliflozin propanediol (FARXIGA) 5 MG TABS tablet, Take 5 mg by mouth daily., Disp: , Rfl:  .  doxycycline (MONODOX) 50 MG capsule, Take 50 mg by mouth daily., Disp: , Rfl:  .  furosemide (LASIX) 20 MG tablet, Take 1 tablet (20 mg total) by mouth daily., Disp: 30 tablet, Rfl: 3 .  glipiZIDE (GLUCOTROL) 10 MG tablet, Take 10 mg by mouth 2 (two) times daily before a meal., Disp: , Rfl:  .  hydrocortisone 2.5 % cream, Apply 1 application topically at bedtime. , Disp: , Rfl:  .  insulin glargine (LANTUS) 100 UNIT/ML injection, Inject 45 Units into the skin at bedtime. , Disp: , Rfl:  .  ketoconazole (NIZORAL) 2 % cream, Apply 1 application topically daily. , Disp: , Rfl:  .  Krill Oil 500 MG CAPS, Take 1,500 mg by mouth 2 (two) times daily., Disp: , Rfl:  .  lisinopril (PRINIVIL,ZESTRIL) 5 MG tablet, Take 5 mg by mouth daily., Disp: , Rfl:  .  loperamide (IMODIUM A-D) 2 MG tablet, Take 2-4 mg by mouth 4 (four) times daily as needed for diarrhea or loose stools. , Disp: , Rfl:  .  metFORMIN (GLUCOPHAGE) 1000 MG tablet, Take 1,000 mg by mouth 2 (two) times daily with a meal., Disp: , Rfl:  .  potassium chloride SA (K-DUR,KLOR-CON) 20 MEQ tablet, Take 1 tablet (20 mEq total) by mouth daily., Disp: 90 tablet, Rfl: 3 .  sitaGLIPtin (JANUVIA) 100 MG tablet, Take 100 mg by mouth daily., Disp: , Rfl:  .  spironolactone (ALDACTONE) 25 MG tablet, Take 1 tablet (25 mg total) by mouth daily., Disp: 30 tablet, Rfl: 0 .  venlafaxine XR (EFFEXOR-XR) 75 MG 24 hr capsule, Take 75 mg by mouth 2 (two) times daily., Disp: ,  Rfl:  .  bismuth-metronidazole-tetracycline (PYLERA) 140-125-125 MG capsule, Take 3 capsules by mouth 4 (four) times daily -  before meals and at bedtime. (Patient not taking: Reported on 09/08/2019), Disp: 120 capsule, Rfl: 0 .  omeprazole (PRILOSEC) 20 MG capsule, Take 1 capsule (20 mg total) by mouth daily. (Patient not taking: Reported on 09/08/2019), Disp: 30 capsule, Rfl: 1 .  pantoprazole (PROTONIX) 40 MG tablet, Take 1 tablet (40 mg total) by mouth daily before breakfast. (Patient not taking: Reported on 09/08/2019), Disp: 30 tablet, Rfl: 5   Family History: family history includes Cancer in her paternal grandfather and paternal grandmother; Cancer - Other in her mother; Dementia in her father; Diabetes  in her father; Hypertension in her father; Hyperthyroidism in her brother.    Social History:   reports that she has been smoking. She started smoking about 53 years ago. She has a 40.00 pack-year smoking history. She has never used smokeless tobacco. She reports that she does not drink alcohol or use drugs.   Review of Systems: Constitutional: Denies weight loss/weight gain  Eyes: No changes in vision. ENT: No oral lesions, sore throat.  GI: see HPI.  Heme/Lymph: No easy bruising.  CV: No chest pain.  GU: No hematuria.  Integumentary: No rashes.  Neuro: No headaches.  Psych: No depression/anxiety.  Endocrine: No heat/cold intolerance.  Allergic/Immunologic: No urticaria.  Resp: No cough, SOB.  Musculoskeletal: No joint swelling.    Physical Examination: BP (!) 109/56 (BP Location: Right Arm, Patient Position: Sitting, Cuff Size: Large)   Pulse 74   Temp (!) 97.3 F (36.3 C) (Temporal)   Ht 5' 8"  (1.727 m)   Wt 219 lb 6.4 oz (99.5 kg)   BMI 33.36 kg/m  Gen: NAD, alert and oriented x 4 HEENT: PEERLA, EOMI, Neck: supple, no JVD Chest: CTA bilaterally, no wheezes, crackles, or other adventitious sounds CV: RRR, no m/g/c/r Abd: soft, NT, ND, +BS in all four quadrants; no HSM,  guarding, ridigity, or rebound tenderness Ext: no edema, well perfused with 2+ pulses, Skin: no rash or lesions noted on observed skin Lymph: no noted LAD  Data Reviewed:  EGD 04/19/2019 by Dr. Laural Golden:  - Normal proximal esophagus and mid esophagus. - LA Grade B reflux esophagitis. - Z-line irregular, 37 cm from the incisors. Biopsied. - 2 cm hiatal hernia. - Portal hypertensive gastropathy. - Duodenal erosions without bleeding. - Normal D2.    Liver Biopsy 03/17/2019:  Liver, needle/core biopsy, Random Right Lobe - MODERATELY ACTIVE STEATOHEPATITIS (GRADE 2 OF 3) WITH CIRRHOSIS (STAGE 4 OF 4) The findings are that of moderately active steatohepatitis, alcoholic and/or non-alcoholic, with cirrhosis. Patients elevated antimitochondrial antibody titer is noted but the histologic findings are not supportive of primary biliary cholangitis  August 24, 2019 labs-sodium 134, glucose 265, albumin 2.9, AST 59, ALT 49, CBC normal with normal platelets  Assessment/Plan: Ms. Morici is a 69 y.o. female   1.  History of H. pylori-has completed triple therapy and is now off PPI as well given side effects as below.  We will check H. pylori breath test to confirm eradication  2.  Lajuana Matte has had an endoscopy without varices.  She is due for her abdominal ultrasound in February 2020, ordered today.  She is also due for an AFP.  She has received 2 of her 3 hepatitis B vaccines and will get the third in April 2020.  We discussed weight loss and diet today-does need to try to lose weight, she is up since last visit but feels legs are swelling worse and may be fluid weight, see below.  She recently had a CBC normal and a CMP with improved LFTs 2 weeks ago.  Repeat other routine cirrhosis labs today.  3.  Lower extremity edema-on low-dose Lasix 20 mg and and Aldactone 50m, her PCP manages these for lower extremity edema.  Did review may need to increase doses given she still has lower extremity  edema, particularly if ultrasound shows ascites.  Sent refills today, she would like to discuss with PCP possible dose increase.  Follow-up 6 months-call sooner if needed  Planning to get first Covid-19 vaccine tomorrow  JTienwas seen today for follow-up.  Diagnoses and all orders for this visit:  Liver cirrhosis secondary to NASH (nonalcoholic steatohepatitis) ([place]) -     US Abdomen Complete; Future -     AFP tumor marker -     H. pylori breath test -     INR/PT  History of Helicobacter pylori infection -     H. pylori breath test  Cirrhosis of liver without ascites, unspecified hepatic cirrhosis type ([facility]) -     spironolactone (ALDACTONE) 25 MG tablet; Take 1 tablet (25 mg total) by mouth daily.  Bilateral edema of lower extremity -     furosemide (LASIX) 20 MG tablet; Take 1 tablet (20 mg total) by mouth daily. -     US Abdomen Complete; Future -     AFP tumor marker -     H. pylori breath test -     INR/PT      I personally performed the service, non-incident to. (WP)  Laurine Blazer, [facility]

## 2019-09-08 NOTE — Patient Instructions (Signed)
If needing something for reflux occasionally based on diet okay to try pepcid over the counter - this is different family than prilosec and protonix   We are checking labs and [place] today   Recommend low salt diet. Avoid fatty/greasy foods.

## 2019-09-09 LAB — PROTIME-INR
INR: 1.1
Prothrombin Time: 11.3 s (ref 9.0–11.5)

## 2019-09-09 LAB — AFP TUMOR MARKER: AFP-Tumor Marker: 10.5 ng/mL — ABNORMAL HIGH

## 2019-09-09 LAB — H. PYLORI BREATH TEST: H. pylori Breath Test: NOT DETECTED

## 2019-09-13 ENCOUNTER — Ambulatory Visit (HOSPITAL_COMMUNITY)
Admission: RE | Admit: 2019-09-13 | Discharge: 2019-09-13 | Disposition: A | Discharge status: Home / Self Care | Payer: Medicare Other | Source: Ambulatory Visit | Attending: Gastroenterology | Admitting: Gastroenterology

## 2019-09-13 ENCOUNTER — Other Ambulatory Visit: Payer: Self-pay

## 2019-09-13 DIAGNOSIS — K7460 Unspecified cirrhosis of liver: Secondary | ICD-10-CM | POA: Diagnosis not present

## 2019-09-13 DIAGNOSIS — R600 Localized edema: Secondary | ICD-10-CM | POA: Insufficient documentation

## 2019-09-13 DIAGNOSIS — K7581 Nonalcoholic steatohepatitis (NASH): Secondary | ICD-10-CM | POA: Diagnosis not present

## 2019-09-13 DIAGNOSIS — R161 Splenomegaly, not elsewhere classified: Secondary | ICD-10-CM | POA: Diagnosis not present

## 2019-09-15 ENCOUNTER — Other Ambulatory Visit (INDEPENDENT_AMBULATORY_CARE_PROVIDER_SITE_OTHER): Payer: Self-pay | Admitting: *Deleted

## 2019-09-15 DIAGNOSIS — K7460 Unspecified cirrhosis of liver: Secondary | ICD-10-CM

## 2019-09-17 ENCOUNTER — Telehealth (INDEPENDENT_AMBULATORY_CARE_PROVIDER_SITE_OTHER): Payer: Self-pay | Admitting: Gastroenterology

## 2019-09-17 NOTE — Telephone Encounter (Signed)
Patient left voice mail message regarding test results - please advise - [phone]

## 2019-09-17 NOTE — Telephone Encounter (Signed)
Attempted to call patient Wednesday and today. No answer. Left detailed message on machine again w/ results.

## 2019-11-23 ENCOUNTER — Other Ambulatory Visit (INDEPENDENT_AMBULATORY_CARE_PROVIDER_SITE_OTHER): Payer: Self-pay | Admitting: *Deleted

## 2019-11-23 DIAGNOSIS — K7460 Unspecified cirrhosis of liver: Secondary | ICD-10-CM

## 2019-11-25 DIAGNOSIS — F411 Generalized anxiety disorder: Secondary | ICD-10-CM | POA: Diagnosis not present

## 2019-11-25 DIAGNOSIS — E1165 Type 2 diabetes mellitus with hyperglycemia: Secondary | ICD-10-CM | POA: Diagnosis not present

## 2019-11-25 DIAGNOSIS — I10 Essential (primary) hypertension: Secondary | ICD-10-CM | POA: Diagnosis not present

## 2019-11-25 DIAGNOSIS — F3289 Other specified depressive episodes: Secondary | ICD-10-CM | POA: Diagnosis not present

## 2019-11-25 DIAGNOSIS — K7469 Other cirrhosis of liver: Secondary | ICD-10-CM | POA: Diagnosis not present

## 2020-01-25 ENCOUNTER — Other Ambulatory Visit: Payer: Self-pay

## 2020-01-25 ENCOUNTER — Ambulatory Visit (HOSPITAL_COMMUNITY)
Admission: RE | Admit: 2020-01-25 | Discharge: 2020-01-25 | Disposition: A | Discharge status: Home / Self Care | Payer: Medicare Other | Source: Ambulatory Visit | Attending: Nurse Practitioner | Admitting: Nurse Practitioner

## 2020-01-25 DIAGNOSIS — C50412 Malignant neoplasm of upper-outer quadrant of left female breast: Secondary | ICD-10-CM | POA: Diagnosis not present

## 2020-01-28 ENCOUNTER — Inpatient Hospital Stay (HOSPITAL_COMMUNITY): Payer: Medicare Other

## 2020-01-28 ENCOUNTER — Other Ambulatory Visit: Payer: Self-pay

## 2020-01-28 ENCOUNTER — Ambulatory Visit (HOSPITAL_COMMUNITY)
Admission: RE | Admit: 2020-01-28 | Discharge: 2020-01-28 | Disposition: A | Discharge status: Home / Self Care | Payer: Medicare Other | Source: Ambulatory Visit | Attending: Nurse Practitioner | Admitting: Nurse Practitioner

## 2020-01-28 ENCOUNTER — Inpatient Hospital Stay (HOSPITAL_COMMUNITY): Payer: Medicare Other | Attending: Hematology

## 2020-01-28 DIAGNOSIS — R918 Other nonspecific abnormal finding of lung field: Secondary | ICD-10-CM | POA: Insufficient documentation

## 2020-01-28 DIAGNOSIS — I700 Atherosclerosis of aorta: Secondary | ICD-10-CM | POA: Diagnosis not present

## 2020-01-28 DIAGNOSIS — F17210 Nicotine dependence, cigarettes, uncomplicated: Secondary | ICD-10-CM | POA: Insufficient documentation

## 2020-01-28 DIAGNOSIS — K7581 Nonalcoholic steatohepatitis (NASH): Secondary | ICD-10-CM | POA: Insufficient documentation

## 2020-01-28 DIAGNOSIS — R161 Splenomegaly, not elsewhere classified: Secondary | ICD-10-CM | POA: Diagnosis not present

## 2020-01-28 DIAGNOSIS — Z923 Personal history of irradiation: Secondary | ICD-10-CM | POA: Diagnosis not present

## 2020-01-28 DIAGNOSIS — K766 Portal hypertension: Secondary | ICD-10-CM | POA: Insufficient documentation

## 2020-01-28 DIAGNOSIS — M7989 Other specified soft tissue disorders: Secondary | ICD-10-CM | POA: Diagnosis not present

## 2020-01-28 DIAGNOSIS — Z833 Family history of diabetes mellitus: Secondary | ICD-10-CM | POA: Diagnosis not present

## 2020-01-28 DIAGNOSIS — Z886 Allergy status to analgesic agent status: Secondary | ICD-10-CM | POA: Insufficient documentation

## 2020-01-28 DIAGNOSIS — Z171 Estrogen receptor negative status [ER-]: Secondary | ICD-10-CM | POA: Insufficient documentation

## 2020-01-28 DIAGNOSIS — Z818 Family history of other mental and behavioral disorders: Secondary | ICD-10-CM | POA: Insufficient documentation

## 2020-01-28 DIAGNOSIS — K449 Diaphragmatic hernia without obstruction or gangrene: Secondary | ICD-10-CM | POA: Insufficient documentation

## 2020-01-28 DIAGNOSIS — R42 Dizziness and giddiness: Secondary | ICD-10-CM | POA: Insufficient documentation

## 2020-01-28 DIAGNOSIS — Z8349 Family history of other endocrine, nutritional and metabolic diseases: Secondary | ICD-10-CM | POA: Diagnosis not present

## 2020-01-28 DIAGNOSIS — K7460 Unspecified cirrhosis of liver: Secondary | ICD-10-CM | POA: Diagnosis not present

## 2020-01-28 DIAGNOSIS — Z8249 Family history of ischemic heart disease and other diseases of the circulatory system: Secondary | ICD-10-CM | POA: Insufficient documentation

## 2020-01-28 DIAGNOSIS — Z809 Family history of malignant neoplasm, unspecified: Secondary | ICD-10-CM | POA: Diagnosis not present

## 2020-01-28 DIAGNOSIS — Z79899 Other long term (current) drug therapy: Secondary | ICD-10-CM | POA: Insufficient documentation

## 2020-01-28 DIAGNOSIS — Z808 Family history of malignant neoplasm of other organs or systems: Secondary | ICD-10-CM | POA: Diagnosis not present

## 2020-01-28 DIAGNOSIS — K259 Gastric ulcer, unspecified as acute or chronic, without hemorrhage or perforation: Secondary | ICD-10-CM | POA: Insufficient documentation

## 2020-01-28 DIAGNOSIS — C50412 Malignant neoplasm of upper-outer quadrant of left female breast: Secondary | ICD-10-CM | POA: Diagnosis not present

## 2020-01-28 LAB — CBC WITH DIFFERENTIAL/PLATELET
Abs Immature Granulocytes: 0.02 10*3/uL (ref 0.00–0.07)
Basophils Absolute: 0 10*3/uL (ref 0.0–0.1)
Basophils Relative: 1 %
Eosinophils Absolute: 0.1 10*3/uL (ref 0.0–0.5)
Eosinophils Relative: 3 %
HCT: 38.3 % (ref 36.0–46.0)
Hemoglobin: 12.5 g/dL (ref 12.0–15.0)
Immature Granulocytes: 1 %
Lymphocytes Relative: 22 %
Lymphs Abs: 0.9 10*3/uL (ref 0.7–4.0)
MCH: 30 pg (ref 26.0–34.0)
MCHC: 32.6 g/dL (ref 30.0–36.0)
MCV: 92.1 fL (ref 80.0–100.0)
Monocytes Absolute: 0.5 10*3/uL (ref 0.1–1.0)
Monocytes Relative: 12 %
Neutro Abs: 2.6 10*3/uL (ref 1.7–7.7)
Neutrophils Relative %: 61 %
Platelets: 142 10*3/uL — ABNORMAL LOW (ref 150–400)
RBC: 4.16 MIL/uL (ref 3.87–5.11)
RDW: 15.6 % — ABNORMAL HIGH (ref 11.5–15.5)
WBC: 4.2 10*3/uL (ref 4.0–10.5)
nRBC: 0 % (ref 0.0–0.2)

## 2020-01-28 LAB — COMPREHENSIVE METABOLIC PANEL
ALT: 49 U/L — ABNORMAL HIGH (ref 0–44)
AST: 69 U/L — ABNORMAL HIGH (ref 15–41)
Albumin: 2.6 g/dL — ABNORMAL LOW (ref 3.5–5.0)
Alkaline Phosphatase: 91 U/L (ref 38–126)
Anion gap: 8 (ref 5–15)
BUN: 9 mg/dL (ref 8–23)
CO2: 26 mmol/L (ref 22–32)
Calcium: 9.6 mg/dL (ref 8.9–10.3)
Chloride: 100 mmol/L (ref 98–111)
Creatinine, Ser: 0.57 mg/dL (ref 0.44–1.00)
GFR calc Af Amer: >60 mL/min (ref >60)
GFR calc non Af Amer: >60 mL/min (ref >60)
Glucose, Bld: 331 mg/dL — ABNORMAL HIGH (ref 70–99)
Potassium: 4.5 mmol/L (ref 3.5–5.1)
Sodium: 134 mmol/L — ABNORMAL LOW (ref 135–145)
Total Bilirubin: 1 mg/dL (ref 0.3–1.2)
Total Protein: 6.2 g/dL — ABNORMAL LOW (ref 6.5–8.1)

## 2020-01-28 LAB — LACTATE DEHYDROGENASE: LDH: 116 U/L (ref 98–192)

## 2020-01-28 LAB — VITAMIN D 25 HYDROXY (VIT D DEFICIENCY, FRACTURES): Vit D, 25-Hydroxy: 17.51 ng/mL — ABNORMAL LOW (ref 30–100)

## 2020-01-28 LAB — VITAMIN B12: Vitamin B-12: 350 pg/mL (ref 180–914)

## 2020-02-02 ENCOUNTER — Inpatient Hospital Stay (HOSPITAL_BASED_OUTPATIENT_CLINIC_OR_DEPARTMENT_OTHER): Payer: Medicare Other | Admitting: Nurse Practitioner

## 2020-02-02 ENCOUNTER — Other Ambulatory Visit: Payer: Self-pay

## 2020-02-02 ENCOUNTER — Encounter (HOSPITAL_COMMUNITY): Payer: Self-pay | Admitting: Nurse Practitioner

## 2020-02-02 VITALS — BP 133/57 | HR 71 | Temp 97.7°F | Wt 221.0 lb

## 2020-02-02 DIAGNOSIS — M7989 Other specified soft tissue disorders: Secondary | ICD-10-CM | POA: Diagnosis not present

## 2020-02-02 DIAGNOSIS — E559 Vitamin D deficiency, unspecified: Secondary | ICD-10-CM | POA: Diagnosis not present

## 2020-02-02 DIAGNOSIS — Z171 Estrogen receptor negative status [ER-]: Secondary | ICD-10-CM | POA: Diagnosis not present

## 2020-02-02 DIAGNOSIS — I700 Atherosclerosis of aorta: Secondary | ICD-10-CM | POA: Diagnosis not present

## 2020-02-02 DIAGNOSIS — R42 Dizziness and giddiness: Secondary | ICD-10-CM | POA: Diagnosis not present

## 2020-02-02 DIAGNOSIS — C50412 Malignant neoplasm of upper-outer quadrant of left female breast: Secondary | ICD-10-CM | POA: Diagnosis not present

## 2020-02-02 DIAGNOSIS — R918 Other nonspecific abnormal finding of lung field: Secondary | ICD-10-CM | POA: Diagnosis not present

## 2020-02-02 DIAGNOSIS — K7460 Unspecified cirrhosis of liver: Secondary | ICD-10-CM | POA: Diagnosis not present

## 2020-02-02 MED ORDER — ERGOCALCIFEROL 1.25 MG (50000 UT) PO CAPS: 50000.0000 [IU] | ORAL_CAPSULE | ORAL | 3 refills | Status: DC

## 2020-02-02 NOTE — Assessment & Plan Note (Signed)
1.  Stage Ia (T1BN0M0) triple negative left breast cancer: -Status post left lumpectomy and SLNB by Dr. Ladona Horns on 11/30/2015, 0.8 cm, ER/PR/HER-2 negative, 2 sentinel lymph nodes negative, no LVI, Ki 67 92%. -Status post 4 cycles of TC from 01/12/2016 through 03/15/2016, followed by radiation. -Last mammogram on 01/25/2020 was BI-RADS Category 2 negative. -Patient reports lumpectomy scar is still very hard and unchanged from last time.  It does not cause her any pain just very hard.  She still has some mild lymphedema in the nipple area. -Labs done on 01/28/2020 showed WBC 4.2, hemoglobin 12.5, platelets 142, creatinine 0.57 -We will see her back in 1 year with repeat mammogram and labs  2.  Lung nodules: -CT of the chest without contrast on 02/12/2019 showed stable right upper lobe nodule measuring 9 x 7 mm.  Stable 5 mm and 4 mm right upper lobe pulmonary nodules.  Scattered mediastinal and hilar lymph nodes are stable. -She is an active smoker smokes 1 pack/day for the past 48 years. -CT of the chest without contrast on 01/28/2020 showed unchanged pulmonary nodules measuring 9 mm or smaller.  Postoperative findings of left breast lumpectomy and axillary lymph node dissection.  Cirrhotic morphology of the liver with partially imaged splenomegaly and upper abdominal varices. -She will follow-up in 1 year with repeat scan.  3.  Elevated liver enzymes: -She has advanced cirrhotic changes involving the liver with portal hypertension and portal venous collaterals. -She did not seem to have any clinical ascites.  No episodes of hepatic encephalopathy.  No hematemesis or hematochezia. -He does not report any hepatitis in the past.  She worked as a Marine scientist in [employer].  She did not have any history of excessive alcohol abuse.  Likely etiology is fatty liver. -She was diagnosed with NASH cirrhosis -She has been following up with Dr. Laural Golden.  He performed an upper and lower endoscopies.  She was diagnosed with  H. pylori, a stomach ulcer, and a hiatal hernia.  She reports she just finished her treatment for her H. pylori and follows up with Dr. Laural Golden September 08, 2019. -Labs done on 01/28/2020 showed her liver enzymes are AST 69 and ALT 49. -CT scan done on 01/28/2020 showed cirrhotic morphology of the liver with partially imaged splenomegaly and upper abdominal varices.

## 2020-02-02 NOTE — Patient Instructions (Signed)
[place] at [facility] Discharge Instructions  Follow up in 1 year with ct scan and mammogram   Thank you for choosing [place] at [facility] to provide your oncology and hematology care.  To afford each patient quality time with our provider, please arrive at least 15 minutes before your scheduled appointment time.   If you have a lab appointment with the [place] please come in thru the Main Entrance and check in at the main information desk.  You need to re-schedule your appointment should you arrive 10 or more minutes late.  We strive to give you quality time with our providers, and arriving late affects you and other patients whose appointments are after yours.  Also, if you no show three or more times for appointments you may be dismissed from the clinic at the providers discretion.     Again, thank you for choosing [facility].  Our hope is that these requests will decrease the amount of time that you wait before being seen by our physicians.       _____________________________________________________________  Should you have questions after your visit to [facility], please contact our office at [phone] between the hours of 8:00 a.m. and 4:30 p.m.  Voicemails left after 4:00 p.m. will not be returned until the following business day.  For prescription refill requests, have your pharmacy contact our office and allow 72 hours.    Due to Covid, you will need to wear a mask upon entering the hospital. If you do not have a mask, a mask will be given to you at the Main Entrance upon arrival. For doctor visits, patients may have 1 support person with them. For treatment visits, patients can not have anyone with them due to social distancing guidelines and our immunocompromised population.

## 2020-02-02 NOTE — Progress Notes (Signed)
Taylor Williams, [place] [id number]   CLINIC:  Medical Oncology/Hematology  PCP:  [name], MD [place] [address] [phone]   REASON FOR VISIT: Follow-up for breast cancer   CURRENT THERAPY: Surveillance per NCCN guidelines  BRIEF ONCOLOGIC HISTORY:  Oncology History  Carcinoma of upper-outer quadrant of left female breast ([place])  11/30/2015 Surgery   Lumpectomy and sentinel node procedure with Dr. Ladona Horns   11/30/2015 Surgery   Needle localization of the L breast with mammo guidance   11/30/2015 Pathology Results   L breast invasive ductal carcinoma, mod diff, 0.8 cm, focal DCIS, high grade, 2/2 LN negative, no LVI, pT1b, pN0M0, ER- PR- HER2 - Ki-67 92%   01/12/2016 - 03/15/2016 Chemotherapy   TC x 4 cycles   02/01/2016 Imaging   CT chest screening- Lung-RADS Category 4B, suspicious. Additional imaging evaluation or consultation with pulmonary medicine or thoracic surgery recommended. Bilateral pulmonary nodules, including an inferior right upper lobe spiculated 11 mm nodule.    03/13/2016 Procedure   Port removed by Dr. Ladona Horns due to concerns for infection.   03/14/2016 Procedure   PICC line placed   04/03/2016 PET scan   Majority of visualized pulmonary nodules are too small for PET resolution. Largest nodule in the right upper lobe is likely stable and does not show metabolism above blood pool. Follow-up CT chest without contrast in 3 months is recommended in further evaluation, to document stability, as malignancy cannot be excluded.Hypermetabolic subcutaneous nodule along the medial right shoulder. This the should be amenable to direct Visualization/palpation. Mild hypermetabolism associated with the left breast and left axilla, likely postoperative in etiology. Please correlate clinically. Aortic atherosclerosis and coronary artery calcification.Cirrhosis.     CANCER STAGING: Cancer Staging Carcinoma of  upper-outer quadrant of left female breast [facility]) Staging form: Breast, AJCC 7th Edition - Pathologic stage from 11/30/2015: Stage IA (T1b, N0, cM0) - Signed by Baird Cancer, PA-C on 01/19/2016    INTERVAL HISTORY:  Taylor Williams 69 y.o. female returns for routine follow-up for breast cancer.  Patient reports she is doing well since her last visit.  She denies any new lumps or bumps present.  She denies any new bone pain. Denies any nausea, vomiting, or diarrhea. Denies any new pains. Had not noticed any recent bleeding such as epistaxis, hematuria or hematochezia. Denies recent chest pain on exertion, shortness of breath on minimal exertion, pre-syncopal episodes, or palpitations. Denies any numbness or tingling in hands or feet. Denies any recent fevers, infections, or recent hospitalizations. Patient reports appetite at 100% and energy level at 100%.  She is eating well maintain her weight at this time.     REVIEW OF SYSTEMS:  Review of Systems  Cardiovascular: Positive for leg swelling.  Neurological: Positive for dizziness.  All other systems reviewed and are negative.    PAST MEDICAL/SURGICAL HISTORY:  Past Medical History:  Diagnosis Date   Anemia    Anxiety    Breast cancer ([place])    Cirrhosis of liver not due to alcohol ([place])    Complication of anesthesia    Recall during general anesthesia/ankle surgery/ 'light weight'  very sensitive to narcotics   Depression    Diabetes mellitus without complication ([place])    High cholesterol    Hypertension    Past Surgical History:  Procedure Laterality Date   BIOPSY  04/19/2019   Procedure: BIOPSY;  Surgeon: Rogene Houston, MD;  Location: [facility]  ENDO SUITE;  Service: Endoscopy;;  esophageal   BREAST LUMPECTOMY Left    states for ductal carcinoma, had surgery in late may   COLONOSCOPY     ENDOVENOUS ABLATION SAPHENOUS VEIN W/ LASER Right 04/15/2018   endovenous laser ablation R GSV by Ruta Hinds MD    ENDOVENOUS  ABLATION SAPHENOUS VEIN W/ LASER Left 05/13/2018   endovenous laser ablation left greater saphenous vein by Ruta Hinds MD    ESOPHAGOGASTRODUODENOSCOPY (EGD) WITH PROPOFOL N/A 04/19/2019   Procedure: ESOPHAGOGASTRODUODENOSCOPY (EGD) WITH PROPOFOL;  Surgeon: Rogene Houston, MD;  Location: [facility] ENDO SUITE;  Service: Endoscopy;  Laterality: N/A;  7:30   galbladder     GANGLION CYST EXCISION     TONSILLECTOMY AND ADENOIDECTOMY       SOCIAL HISTORY:  Social History   Socioeconomic History   Marital status: Widowed    Spouse name: Not on file   Number of children: Not on file   Years of education: Not on file   Highest education level: Not on file  Occupational History   Not on file  Tobacco Use   Smoking status: Current Every Day Smoker    Packs/day: 1.00    Years: 40.00    Pack years: 40.00    Start date: 10/28/1965   Smokeless tobacco: Never Used  Vaping Use   Vaping Use: Never used  Substance and Sexual Activity   Alcohol use: No    Comment: 2 - 3 mixed drink yearly   Drug use: No   Sexual activity: Not on file  Other Topics Concern   Not on file  Social History Narrative   Not on file   Social Determinants of Health   Financial Resource Strain:    Difficulty of Paying Living Expenses:   Food Insecurity:    Worried About Charity fundraiser in the Last Year:    Arboriculturist in the Last Year:   Transportation Needs:    Film/video editor (Medical):    Lack of Transportation (Non-Medical):   Physical Activity:    Days of Exercise per Week:    Minutes of Exercise per Session:   Stress:    Feeling of Stress :   Social Connections:    Frequency of Communication with Friends and Family:    Frequency of Social Gatherings with Friends and Family:    Attends Religious Services:    Active Member of Clubs or Organizations:    Attends Music therapist:    Marital Status:   Intimate Partner Violence:    Fear of  Current or Ex-Partner:    Emotionally Abused:    Physically Abused:    Sexually Abused:     FAMILY HISTORY:  Family History  Problem Relation Age of Onset   Cancer - Other Mother    Hypertension Father    Diabetes Father    Dementia Father    Hyperthyroidism Brother    Cancer Paternal Grandmother    Cancer Paternal Grandfather     CURRENT MEDICATIONS:  Outpatient Encounter Medications as of 02/02/2020  Medication Sig Note   ALPRAZolam (XANAX) 0.25 MG tablet Take 0.25 mg by mouth 2 (two) times daily.    cetirizine (ZYRTEC) 10 MG tablet Take 10 mg by mouth at bedtime.     citalopram (CELEXA) 20 MG tablet Take 20 mg by mouth daily.    dapagliflozin propanediol (FARXIGA) 5 MG TABS tablet Take 5 mg by mouth daily.    doxycycline (MONODOX) 50  MG capsule Take 50 mg by mouth daily.    furosemide (LASIX) 20 MG tablet Take 1 tablet (20 mg total) by mouth daily.    glipiZIDE (GLUCOTROL) 10 MG tablet Take 10 mg by mouth 2 (two) times daily before a meal.    hydrocortisone 2.5 % cream Apply 1 application topically at bedtime.     insulin glargine (LANTUS) 100 UNIT/ML injection Inject 45 Units into the skin at bedtime.     ketoconazole (NIZORAL) 2 % cream Apply 1 application topically daily.     Krill Oil 500 MG CAPS Take 1,500 mg by mouth 2 (two) times daily.    lisinopril (PRINIVIL,ZESTRIL) 5 MG tablet Take 5 mg by mouth daily.    metFORMIN (GLUCOPHAGE) 1000 MG tablet Take 1,000 mg by mouth 2 (two) times daily with a meal.    omeprazole (PRILOSEC) 20 MG capsule Take 1 capsule (20 mg total) by mouth daily. 09/08/2019: Made Patient dizzy   potassium chloride SA (K-DUR,KLOR-CON) 20 MEQ tablet Take 1 tablet (20 mEq total) by mouth daily.    sitaGLIPtin (JANUVIA) 100 MG tablet Take 100 mg by mouth daily.    spironolactone (ALDACTONE) 25 MG tablet Take 1 tablet (25 mg total) by mouth daily.    venlafaxine XR (EFFEXOR-XR) 75 MG 24 hr capsule Take 75 mg by mouth 2 (two)  times daily.    ergocalciferol (VITAMIN D2) 1.25 MG (50000 UT) capsule Take 1 capsule (50,000 Units total) by mouth once a week.    loperamide (IMODIUM A-D) 2 MG tablet Take 2-4 mg by mouth 4 (four) times daily as needed for diarrhea or loose stools.  (Patient not taking: Reported on 02/02/2020)    [DISCONTINUED] bismuth-metronidazole-tetracycline (PYLERA) 140-125-125 MG capsule Take 3 capsules by mouth 4 (four) times daily -  before meals and at bedtime. (Patient not taking: Reported on 09/08/2019)    [DISCONTINUED] pantoprazole (PROTONIX) 40 MG tablet Take 1 tablet (40 mg total) by mouth daily before breakfast. (Patient not taking: Reported on 09/08/2019) 09/08/2019: Made patient dizzy   No facility-administered encounter medications on file as of 02/02/2020.    ALLERGIES:  Allergies  Allergen Reactions   Aspirin Swelling    Facial swelling and panic attack when she took pain medication that contained aspirin   Other Swelling    novacaine - patient states reaction happened as a child and she believes it caused facial swelling, mother always told her the MD said never take again   Fish Allergy Other (See Comments)    gagging   Procaine      PHYSICAL EXAM:  ECOG Performance status: 1  Vitals:   02/02/20 1148  BP: (!) 133/57  Pulse: 71  Temp: 97.7 F (36.5 C)  SpO2: 99%   Filed Weights   02/02/20 1148  Weight: 221 lb (100.2 kg)   Physical Exam Constitutional:      Appearance: Normal appearance. She is normal weight.  Cardiovascular:     Rate and Rhythm: Normal rate and regular rhythm.     Heart sounds: Normal heart sounds.  Pulmonary:     Effort: Pulmonary effort is normal.     Breath sounds: Normal breath sounds.  Abdominal:     General: Bowel sounds are normal.     Palpations: Abdomen is soft.  Musculoskeletal:        General: Normal range of motion.  Skin:    General: Skin is warm.  Neurological:     Mental Status: She is alert and oriented to person,  place,  and time. Mental status is at baseline.  Psychiatric:        Mood and Affect: Mood normal.        Behavior: Behavior normal.        Thought Content: Thought content normal.        Judgment: Judgment normal.      LABORATORY DATA:  I have reviewed the labs as listed.  CBC    Component Value Date/Time   WBC 4.2 01/28/2020 1210   RBC 4.16 01/28/2020 1210   HGB 12.5 01/28/2020 1210   HCT 38.3 01/28/2020 1210   PLT 142 (L) 01/28/2020 1210   MCV 92.1 01/28/2020 1210   MCH 30.0 01/28/2020 1210   MCHC 32.6 01/28/2020 1210   RDW 15.6 (H) 01/28/2020 1210   LYMPHSABS 0.9 01/28/2020 1210   MONOABS 0.5 01/28/2020 1210   EOSABS 0.1 01/28/2020 1210   BASOSABS 0.0 01/28/2020 1210   CMP Latest Ref Rng & Units 01/28/2020 08/24/2019 04/15/2019  Glucose 70 - 99 mg/dL 331(H) 265(H) 110(H)  BUN 8 - 23 mg/dL 9 9 9   Creatinine 0.44 - 1.00 mg/dL 0.57 0.55 0.58  Sodium 135 - 145 mmol/L 134(L) 134(L) 138  Potassium 3.5 - 5.1 mmol/L 4.5 4.0 4.1  Chloride 98 - 111 mmol/L 100 103 105  CO2 22 - 32 mmol/L 26 24 26   Calcium 8.9 - 10.3 mg/dL 9.6 8.8(L) 9.0  Total Protein 6.5 - 8.1 g/dL 6.2(L) 6.8 -  Total Bilirubin 0.3 - 1.2 mg/dL 1.0 0.8 -  Alkaline Phos 38 - 126 U/L 91 85 -  AST 15 - 41 U/L 69(H) 59(H) -  ALT 0 - 44 U/L 49(H) 49(H) -    DIAGNOSTIC IMAGING:  I have independently reviewed the CT scans and discussed with the patient.  ASSESSMENT & PLAN:  Carcinoma of upper-outer quadrant of left female breast ([place]) 1.  Stage Ia (T1BN0M0) triple negative left breast cancer: -Status post left lumpectomy and SLNB by Dr. Ladona Horns on 11/30/2015, 0.8 cm, ER/PR/HER-2 negative, 2 sentinel lymph nodes negative, no LVI, Ki 67 92%. -Status post 4 cycles of TC from 01/12/2016 through 03/15/2016, followed by radiation. -Last mammogram on 01/25/2020 was BI-RADS Category 2 negative. -Patient reports lumpectomy scar is still very hard and unchanged from last time.  It does not cause her any pain just very hard.  She still  has some mild lymphedema in the nipple area. -Labs done on 01/28/2020 showed WBC 4.2, hemoglobin 12.5, platelets 142, creatinine 0.57 -We will see her back in 1 year with repeat mammogram and labs  2.  Lung nodules: -CT of the chest without contrast on 02/12/2019 showed stable right upper lobe nodule measuring 9 x 7 mm.  Stable 5 mm and 4 mm right upper lobe pulmonary nodules.  Scattered mediastinal and hilar lymph nodes are stable. -She is an active smoker smokes 1 pack/day for the past 48 years. -CT of the chest without contrast on 01/28/2020 showed unchanged pulmonary nodules measuring 9 mm or smaller.  Postoperative findings of left breast lumpectomy and axillary lymph node dissection.  Cirrhotic morphology of the liver with partially imaged splenomegaly and upper abdominal varices. -She will follow-up in 1 year with repeat scan.  3.  Elevated liver enzymes: -She has advanced cirrhotic changes involving the liver with portal hypertension and portal venous collaterals. -She did not seem to have any clinical ascites.  No episodes of hepatic encephalopathy.  No hematemesis or hematochezia. -He does not report any hepatitis in the past.  She worked as a Marine scientist in [employer].  She did not have any history of excessive alcohol abuse.  Likely etiology is fatty liver. -She was diagnosed with NASH cirrhosis -She has been following up with Dr. Laural Golden.  He performed an upper and lower endoscopies.  She was diagnosed with H. pylori, a stomach ulcer, and a hiatal hernia.  She reports she just finished her treatment for her H. pylori and follows up with Dr. Laural Golden September 08, 2019. -Labs done on 01/28/2020 showed her liver enzymes are AST 69 and ALT 49. -CT scan done on 01/28/2020 showed cirrhotic morphology of the liver with partially imaged splenomegaly and upper abdominal varices.     Orders placed this encounter:  Orders Placed This Encounter  Procedures   MM DIAG BREAST TOMO BILATERAL   CT Chest Wo  Contrast   Lactate dehydrogenase   CBC with Differential/Platelet   Comprehensive metabolic panel   Vitamin T97   VITAMIN D 25 Hydroxy (Vit-D Deficiency, Fractures)   Folate      Francene Finders, FNP-C [place] [phone]

## 2020-02-15 ENCOUNTER — Other Ambulatory Visit (HOSPITAL_COMMUNITY): Payer: Self-pay

## 2020-02-15 DIAGNOSIS — E559 Vitamin D deficiency, unspecified: Secondary | ICD-10-CM

## 2020-02-16 MED ORDER — ERGOCALCIFEROL 1.25 MG (50000 UT) PO CAPS: 50000.0000 [IU] | ORAL_CAPSULE | ORAL | 3 refills | Status: AC

## 2020-03-02 DIAGNOSIS — E1165 Type 2 diabetes mellitus with hyperglycemia: Secondary | ICD-10-CM | POA: Diagnosis not present

## 2020-03-02 DIAGNOSIS — F411 Generalized anxiety disorder: Secondary | ICD-10-CM | POA: Diagnosis not present

## 2020-03-02 DIAGNOSIS — F3289 Other specified depressive episodes: Secondary | ICD-10-CM | POA: Diagnosis not present

## 2020-03-02 DIAGNOSIS — I10 Essential (primary) hypertension: Secondary | ICD-10-CM | POA: Diagnosis not present

## 2020-03-02 DIAGNOSIS — K7469 Other cirrhosis of liver: Secondary | ICD-10-CM | POA: Diagnosis not present

## 2020-03-07 ENCOUNTER — Other Ambulatory Visit: Payer: Self-pay

## 2020-03-07 ENCOUNTER — Ambulatory Visit (INDEPENDENT_AMBULATORY_CARE_PROVIDER_SITE_OTHER): Payer: Medicare Other | Admitting: Internal Medicine

## 2020-03-07 ENCOUNTER — Encounter (INDEPENDENT_AMBULATORY_CARE_PROVIDER_SITE_OTHER): Payer: Self-pay | Admitting: Internal Medicine

## 2020-03-07 VITALS — BP 107/64 | HR 85 | Temp 98.7°F | Ht 68.0 in | Wt 214.0 lb

## 2020-03-07 DIAGNOSIS — K7460 Unspecified cirrhosis of liver: Secondary | ICD-10-CM

## 2020-03-07 DIAGNOSIS — R600 Localized edema: Secondary | ICD-10-CM | POA: Diagnosis not present

## 2020-03-07 NOTE — Patient Instructions (Signed)
Increase protein intake as discussed. Physician will call with results of blood test when completed.

## 2020-03-07 NOTE — Progress Notes (Signed)
Presenting complaint;  Follow-up for chronic liver disease/cirrhosis.  Database and subjective:  Patient is 69 year old Caucasian female who has cirrhosis secondary to NASH who was last seen in the office in April 2021. She had esophagogastroduodenoscopy on 04/19/2019 revealing grade B reflux esophagitis but no evidence of esophageal or gastric varices.  She had 2 cm sliding hiatal hernia and portal gastropathy. She has received hepatitis A and B vaccinations.  Patient says she is doing well other than she remains with lower extremity edema.  She has good appetite.  She is watching food intake.  She has nausea on some days but no vomiting.  She says nausea comes in waves.  She denies abdominal pain melena or rectal bleeding.  She generally has 1-2 bowel movements per day.  She says her last A1c was greater than 7. She denies frequent heartburn.  She stopped omeprazole because it made her dizzy and she was not able to drive. She is not having memory issues or confusion. She has lost 7 pounds since her last visit. She says few years ago she used to weigh 275 pounds.  Current Medications: Outpatient Encounter Medications as of 03/07/2020  Medication Sig  . ALPRAZolam (XANAX) 0.25 MG tablet Take 0.25 mg by mouth 2 (two) times daily.  . cetirizine (ZYRTEC) 10 MG tablet Take 10 mg by mouth at bedtime.   . citalopram (CELEXA) 20 MG tablet Take 20 mg by mouth daily.  . dapagliflozin propanediol (FARXIGA) 5 MG TABS tablet Take 5 mg by mouth daily.  Marland Kitchen doxycycline (MONODOX) 50 MG capsule Take 50 mg by mouth daily.  . furosemide (LASIX) 20 MG tablet Take 1 tablet (20 mg total) by mouth daily.  Marland Kitchen glipiZIDE (GLUCOTROL) 10 MG tablet Take 10 mg by mouth 2 (two) times daily before a meal.  . hydrocortisone 2.5 % cream Apply 1 application topically at bedtime.   . insulin glargine (LANTUS) 100 UNIT/ML injection Inject 45 Units into the skin at bedtime.   Marland Kitchen ketoconazole (NIZORAL) 2 % cream Apply 1 application  topically daily.   Javier Docker Oil 500 MG CAPS Take 1,500 mg by mouth 2 (two) times daily.  Marland Kitchen lisinopril (PRINIVIL,ZESTRIL) 5 MG tablet Take 5 mg by mouth daily.  . metFORMIN (GLUCOPHAGE) 1000 MG tablet Take 1,000 mg by mouth 2 (two) times daily with a meal.  . omeprazole (PRILOSEC) 20 MG capsule Take 1 capsule (20 mg total) by mouth daily.  . potassium chloride SA (K-DUR,KLOR-CON) 20 MEQ tablet Take 1 tablet (20 mEq total) by mouth daily.  . sitaGLIPtin (JANUVIA) 100 MG tablet Take 100 mg by mouth daily.  Marland Kitchen spironolactone (ALDACTONE) 25 MG tablet Take 1 tablet (25 mg total) by mouth daily.  Marland Kitchen venlafaxine XR (EFFEXOR-XR) 75 MG 24 hr capsule Take 75 mg by mouth 2 (two) times daily.  . ergocalciferol (VITAMIN D2) 1.25 MG (50000 UT) capsule Take 1 capsule (50,000 Units total) by mouth once a week. (Patient not taking: Reported on 03/07/2020)  . loperamide (IMODIUM A-D) 2 MG tablet Take 2-4 mg by mouth 4 (four) times daily as needed for diarrhea or loose stools.  (Patient not taking: Reported on 02/02/2020)   No facility-administered encounter medications on file as of 03/07/2020.     Objective: Blood pressure 107/64, pulse 85, temperature 98.7 F (37.1 C), temperature source Oral, height 5' 8"  (1.727 m), weight 214 lb (97.1 kg). Patient is alert and in no acute distress. She does not have asterixis. She is wearing a mask. Conjunctiva is  pink. Sclera is nonicteric Oropharyngeal mucosa is normal. She has few remaining teeth in poor condition. No neck masses or thyromegaly noted. Cardiac exam with regular rhythm normal S1 and S2. No murmur or gallop noted. Lungs are clear to auscultation. Abdomen is full but soft and nontender with organomegaly or masses. She has 1-2+ pitting edema involving her legs.  Labs/studies Results:  CBC Latest Ref Rng & Units 01/28/2020 08/24/2019 03/17/2019  WBC 4.0 - 10.5 K/uL 4.2 4.2 7.4  Hemoglobin 12.0 - 15.0 g/dL 12.5 13.1 13.5  Hematocrit 36 - 46 % 38.3 40.3 42.2   Platelets 150 - 400 K/uL 142(L) 167 239    CMP Latest Ref Rng & Units 01/28/2020 08/24/2019 04/15/2019  Glucose 70 - 99 mg/dL 331(H) 265(H) 110(H)  BUN 8 - 23 mg/dL 9 9 9   Creatinine 0.44 - 1.00 mg/dL 0.57 0.55 0.58  Sodium 135 - 145 mmol/L 134(L) 134(L) 138  Potassium 3.5 - 5.1 mmol/L 4.5 4.0 4.1  Chloride 98 - 111 mmol/L 100 103 105  CO2 22 - 32 mmol/L 26 24 26   Calcium 8.9 - 10.3 mg/dL 9.6 8.8(L) 9.0  Total Protein 6.5 - 8.1 g/dL 6.2(L) 6.8 -  Total Bilirubin 0.3 - 1.2 mg/dL 1.0 0.8 -  Alkaline Phos 38 - 126 U/L 91 85 -  AST 15 - 41 U/L 69(H) 59(H) -  ALT 0 - 44 U/L 49(H) 49(H) -    Hepatic Function Latest Ref Rng & Units 01/28/2020 08/24/2019 03/05/2019  Total Protein 6.5 - 8.1 g/dL 6.2(L) 6.8 7.8  Albumin 3.5 - 5.0 g/dL 2.6(L) 2.9(L) 3.4(L)  AST 15 - 41 U/L 69(H) 59(H) 119(H)  ALT 0 - 44 U/L 49(H) 49(H) 99(H)  Alk Phosphatase 38 - 126 U/L 91 85 99  Total Bilirubin 0.3 - 1.2 mg/dL 1.0 0.8 0.9     Assessment:  #1.  Cirrhosis secondary to NASH.  She has received hepatitis A and B vaccination.  EGD in September last year was negative for esophageal or gastric varices. She is due for [facility] screening.  #2.  GERD.  She could not tolerate omeprazole.  She is watching her diet.  If nausea worsens will try another PPI or H2B.  #3.  LE edema.  Serum albumin is low which would contribute to LE edema but she may also have varicose veins.  Given soft blood pressure I am reluctant to recommend increasing diuretic dose.  Review of her diet suggest that protein intake may be insufficient. She may benefit from increasing protein intake.   Plan:  Patient will go to the lab for INR and serum albumin level. We will also check serum alpha-fetoprotein. Schedule abdominal ultrasound for [place] screening. Patient advised to increase intake of protein rich foods such as she can have 1 or 2 eggs daily without yolk. Office visit in 3 months.

## 2020-03-08 DIAGNOSIS — L218 Other seborrheic dermatitis: Secondary | ICD-10-CM | POA: Diagnosis not present

## 2020-03-08 DIAGNOSIS — L280 Lichen simplex chronicus: Secondary | ICD-10-CM | POA: Diagnosis not present

## 2020-03-08 DIAGNOSIS — L719 Rosacea, unspecified: Secondary | ICD-10-CM | POA: Diagnosis not present

## 2020-03-08 DIAGNOSIS — L570 Actinic keratosis: Secondary | ICD-10-CM | POA: Diagnosis not present

## 2020-03-08 LAB — PROTIME-INR
INR: 1
Prothrombin Time: 11.2 s (ref 9.0–11.5)

## 2020-03-08 LAB — ALBUMIN: Albumin: 3.2 g/dL — ABNORMAL LOW (ref 3.6–5.1)

## 2020-03-08 LAB — AFP TUMOR MARKER: AFP-Tumor Marker: 19.8 ng/mL — ABNORMAL HIGH

## 2020-03-10 ENCOUNTER — Other Ambulatory Visit (INDEPENDENT_AMBULATORY_CARE_PROVIDER_SITE_OTHER): Payer: Self-pay | Admitting: *Deleted

## 2020-03-10 DIAGNOSIS — K7460 Unspecified cirrhosis of liver: Secondary | ICD-10-CM

## 2020-03-13 ENCOUNTER — Ambulatory Visit (HOSPITAL_COMMUNITY): Payer: Medicare Other

## 2020-03-14 ENCOUNTER — Ambulatory Visit (HOSPITAL_COMMUNITY): Admission: RE | Admit: 2020-03-14 | Payer: Medicare Other | Source: Ambulatory Visit

## 2020-03-17 ENCOUNTER — Other Ambulatory Visit: Payer: Self-pay

## 2020-03-17 ENCOUNTER — Ambulatory Visit (HOSPITAL_COMMUNITY)
Admission: RE | Admit: 2020-03-17 | Discharge: 2020-03-17 | Disposition: A | Discharge status: Home / Self Care | Payer: Medicare Other | Source: Ambulatory Visit | Attending: Internal Medicine | Admitting: Internal Medicine

## 2020-03-17 DIAGNOSIS — K7460 Unspecified cirrhosis of liver: Secondary | ICD-10-CM | POA: Insufficient documentation

## 2020-03-17 DIAGNOSIS — R161 Splenomegaly, not elsewhere classified: Secondary | ICD-10-CM | POA: Diagnosis not present

## 2020-04-06 DIAGNOSIS — Z23 Encounter for immunization: Secondary | ICD-10-CM | POA: Diagnosis not present

## 2020-05-17 ENCOUNTER — Other Ambulatory Visit (INDEPENDENT_AMBULATORY_CARE_PROVIDER_SITE_OTHER): Payer: Self-pay | Admitting: *Deleted

## 2020-05-17 DIAGNOSIS — K7460 Unspecified cirrhosis of liver: Secondary | ICD-10-CM

## 2020-06-01 DIAGNOSIS — F3289 Other specified depressive episodes: Secondary | ICD-10-CM | POA: Diagnosis not present

## 2020-06-01 DIAGNOSIS — K7469 Other cirrhosis of liver: Secondary | ICD-10-CM | POA: Diagnosis not present

## 2020-06-01 DIAGNOSIS — E1143 Type 2 diabetes mellitus with diabetic autonomic (poly)neuropathy: Secondary | ICD-10-CM | POA: Diagnosis not present

## 2020-06-01 DIAGNOSIS — Z0000 Encounter for general adult medical examination without abnormal findings: Secondary | ICD-10-CM | POA: Diagnosis not present

## 2020-06-01 DIAGNOSIS — E0843 Diabetes mellitus due to underlying condition with diabetic autonomic (poly)neuropathy: Secondary | ICD-10-CM | POA: Diagnosis not present

## 2020-06-01 DIAGNOSIS — I10 Essential (primary) hypertension: Secondary | ICD-10-CM | POA: Diagnosis not present

## 2020-06-01 DIAGNOSIS — Z1389 Encounter for screening for other disorder: Secondary | ICD-10-CM | POA: Diagnosis not present

## 2020-06-01 DIAGNOSIS — F411 Generalized anxiety disorder: Secondary | ICD-10-CM | POA: Diagnosis not present

## 2020-06-16 DIAGNOSIS — E11319 Type 2 diabetes mellitus with unspecified diabetic retinopathy without macular edema: Secondary | ICD-10-CM | POA: Diagnosis not present

## 2020-06-16 DIAGNOSIS — K7460 Unspecified cirrhosis of liver: Secondary | ICD-10-CM | POA: Diagnosis not present

## 2020-06-19 LAB — AFP TUMOR MARKER: AFP-Tumor Marker: 219.1 ng/mL — ABNORMAL HIGH

## 2020-06-20 ENCOUNTER — Other Ambulatory Visit (INDEPENDENT_AMBULATORY_CARE_PROVIDER_SITE_OTHER): Payer: Self-pay

## 2020-06-20 ENCOUNTER — Telehealth (INDEPENDENT_AMBULATORY_CARE_PROVIDER_SITE_OTHER): Payer: Self-pay

## 2020-06-20 DIAGNOSIS — R772 Abnormality of alphafetoprotein: Secondary | ICD-10-CM

## 2020-06-20 NOTE — Telephone Encounter (Signed)
LeighAnn Tryston Gilliam, CMA  

## 2020-06-20 NOTE — Telephone Encounter (Signed)
Taylor Williams, CMA  

## 2020-07-04 ENCOUNTER — Ambulatory Visit (HOSPITAL_COMMUNITY)
Admission: RE | Admit: 2020-07-04 | Discharge: 2020-07-04 | Disposition: A | Discharge status: Home / Self Care | Payer: Medicare Other | Source: Ambulatory Visit | Attending: Internal Medicine | Admitting: Internal Medicine

## 2020-07-04 ENCOUNTER — Other Ambulatory Visit: Payer: Self-pay

## 2020-07-04 DIAGNOSIS — Z853 Personal history of malignant neoplasm of breast: Secondary | ICD-10-CM | POA: Diagnosis not present

## 2020-07-04 DIAGNOSIS — R7989 Other specified abnormal findings of blood chemistry: Secondary | ICD-10-CM | POA: Diagnosis not present

## 2020-07-04 DIAGNOSIS — R772 Abnormality of alphafetoprotein: Secondary | ICD-10-CM | POA: Diagnosis not present

## 2020-07-04 DIAGNOSIS — K7460 Unspecified cirrhosis of liver: Secondary | ICD-10-CM | POA: Diagnosis not present

## 2020-07-04 MED ORDER — GADOBUTROL 1 MMOL/ML IV SOLN
10.0000 mL | Freq: Once | INTRAVENOUS | Status: AC | PRN
  Administered 2020-07-04: 10 mL via INTRAVENOUS

## 2020-08-11 DIAGNOSIS — H25813 Combined forms of age-related cataract, bilateral: Secondary | ICD-10-CM | POA: Diagnosis not present

## 2020-08-11 DIAGNOSIS — E119 Type 2 diabetes mellitus without complications: Secondary | ICD-10-CM | POA: Diagnosis not present

## 2020-08-11 DIAGNOSIS — H0102A Squamous blepharitis right eye, upper and lower eyelids: Secondary | ICD-10-CM | POA: Diagnosis not present

## 2020-08-11 DIAGNOSIS — H0102B Squamous blepharitis left eye, upper and lower eyelids: Secondary | ICD-10-CM | POA: Diagnosis not present

## 2020-08-31 DIAGNOSIS — K7469 Other cirrhosis of liver: Secondary | ICD-10-CM | POA: Diagnosis not present

## 2020-08-31 DIAGNOSIS — Z0000 Encounter for general adult medical examination without abnormal findings: Secondary | ICD-10-CM | POA: Diagnosis not present

## 2020-08-31 DIAGNOSIS — F411 Generalized anxiety disorder: Secondary | ICD-10-CM | POA: Diagnosis not present

## 2020-08-31 DIAGNOSIS — I10 Essential (primary) hypertension: Secondary | ICD-10-CM | POA: Diagnosis not present

## 2020-08-31 DIAGNOSIS — F3289 Other specified depressive episodes: Secondary | ICD-10-CM | POA: Diagnosis not present

## 2020-08-31 DIAGNOSIS — E1143 Type 2 diabetes mellitus with diabetic autonomic (poly)neuropathy: Secondary | ICD-10-CM | POA: Diagnosis not present

## 2020-09-06 DIAGNOSIS — L218 Other seborrheic dermatitis: Secondary | ICD-10-CM | POA: Diagnosis not present

## 2020-09-06 DIAGNOSIS — L719 Rosacea, unspecified: Secondary | ICD-10-CM | POA: Diagnosis not present

## 2020-09-21 DIAGNOSIS — H2511 Age-related nuclear cataract, right eye: Secondary | ICD-10-CM | POA: Diagnosis not present

## 2020-10-16 ENCOUNTER — Telehealth (INDEPENDENT_AMBULATORY_CARE_PROVIDER_SITE_OTHER): Payer: Self-pay | Admitting: Internal Medicine

## 2020-10-16 NOTE — Telephone Encounter (Signed)
I have called the patient and left a message letting her know that this would be addressed with Dr. Laural Golden tomorrow when he is back in the office.

## 2020-10-16 NOTE — Telephone Encounter (Signed)
Patient left voice mail message stating the last time she saw Dr Laural Golden he was talking to her about doing more test - states she hasn't heard anything else about it - please advise - ph# [phone]

## 2020-10-25 ENCOUNTER — Telehealth (INDEPENDENT_AMBULATORY_CARE_PROVIDER_SITE_OTHER): Payer: Self-pay | Admitting: Internal Medicine

## 2020-10-25 NOTE — Telephone Encounter (Signed)
Dr. Laural Golden was made aware. He will call the patient to discuss.

## 2020-10-25 NOTE — Telephone Encounter (Signed)
Please schedule multiphase hepatic CT as soon as possible  Indication is cirrhosis elevated alpha-fetoprotein and poorly correct rise lesions on MR

## 2020-10-25 NOTE — Telephone Encounter (Signed)
Patient left message stating she has not heard back from Dr Laural Golden - please advise - ph# [phone]

## 2020-10-26 ENCOUNTER — Emergency Department (HOSPITAL_COMMUNITY): Payer: Medicare Other

## 2020-10-26 ENCOUNTER — Other Ambulatory Visit (INDEPENDENT_AMBULATORY_CARE_PROVIDER_SITE_OTHER): Payer: Self-pay

## 2020-10-26 ENCOUNTER — Encounter (HOSPITAL_COMMUNITY): Payer: Self-pay | Admitting: Emergency Medicine

## 2020-10-26 ENCOUNTER — Inpatient Hospital Stay (HOSPITAL_COMMUNITY)
Admission: EM | Admit: 2020-10-26 | Discharge: 2020-11-02 | DRG: 435 | Disposition: A | Discharge status: Skilled Nursing Facility | Payer: Medicare Other | Attending: Internal Medicine | Admitting: Internal Medicine

## 2020-10-26 ENCOUNTER — Other Ambulatory Visit: Payer: Self-pay

## 2020-10-26 DIAGNOSIS — K219 Gastro-esophageal reflux disease without esophagitis: Secondary | ICD-10-CM | POA: Diagnosis present

## 2020-10-26 DIAGNOSIS — K7581 Nonalcoholic steatohepatitis (NASH): Secondary | ICD-10-CM | POA: Diagnosis present

## 2020-10-26 DIAGNOSIS — F32A Depression, unspecified: Secondary | ICD-10-CM | POA: Diagnosis present

## 2020-10-26 DIAGNOSIS — Z20822 Contact with and (suspected) exposure to covid-19: Secondary | ICD-10-CM | POA: Diagnosis not present

## 2020-10-26 DIAGNOSIS — K449 Diaphragmatic hernia without obstruction or gangrene: Secondary | ICD-10-CM | POA: Diagnosis present

## 2020-10-26 DIAGNOSIS — Z9114 Patient's other noncompliance with medication regimen: Secondary | ICD-10-CM

## 2020-10-26 DIAGNOSIS — E871 Hypo-osmolality and hyponatremia: Secondary | ICD-10-CM | POA: Diagnosis not present

## 2020-10-26 DIAGNOSIS — K760 Fatty (change of) liver, not elsewhere classified: Secondary | ICD-10-CM

## 2020-10-26 DIAGNOSIS — E43 Unspecified severe protein-calorie malnutrition: Secondary | ICD-10-CM

## 2020-10-26 DIAGNOSIS — R1010 Upper abdominal pain, unspecified: Secondary | ICD-10-CM

## 2020-10-26 DIAGNOSIS — L03113 Cellulitis of right upper limb: Secondary | ICD-10-CM | POA: Diagnosis not present

## 2020-10-26 DIAGNOSIS — C801 Malignant (primary) neoplasm, unspecified: Secondary | ICD-10-CM

## 2020-10-26 DIAGNOSIS — Z8249 Family history of ischemic heart disease and other diseases of the circulatory system: Secondary | ICD-10-CM

## 2020-10-26 DIAGNOSIS — I10 Essential (primary) hypertension: Secondary | ICD-10-CM | POA: Diagnosis present

## 2020-10-26 DIAGNOSIS — K7460 Unspecified cirrhosis of liver: Secondary | ICD-10-CM

## 2020-10-26 DIAGNOSIS — Z886 Allergy status to analgesic agent status: Secondary | ICD-10-CM

## 2020-10-26 DIAGNOSIS — Z720 Tobacco use: Secondary | ICD-10-CM

## 2020-10-26 DIAGNOSIS — K766 Portal hypertension: Secondary | ICD-10-CM | POA: Diagnosis present

## 2020-10-26 DIAGNOSIS — C220 Liver cell carcinoma: Secondary | ICD-10-CM | POA: Diagnosis not present

## 2020-10-26 DIAGNOSIS — I868 Varicose veins of other specified sites: Secondary | ICD-10-CM | POA: Diagnosis present

## 2020-10-26 DIAGNOSIS — K769 Liver disease, unspecified: Secondary | ICD-10-CM

## 2020-10-26 DIAGNOSIS — F17210 Nicotine dependence, cigarettes, uncomplicated: Secondary | ICD-10-CM | POA: Diagnosis present

## 2020-10-26 DIAGNOSIS — R17 Unspecified jaundice: Secondary | ICD-10-CM | POA: Diagnosis not present

## 2020-10-26 DIAGNOSIS — S91101A Unspecified open wound of right great toe without damage to nail, initial encounter: Secondary | ICD-10-CM | POA: Diagnosis not present

## 2020-10-26 DIAGNOSIS — R609 Edema, unspecified: Secondary | ICD-10-CM

## 2020-10-26 DIAGNOSIS — Z794 Long term (current) use of insulin: Secondary | ICD-10-CM

## 2020-10-26 DIAGNOSIS — E876 Hypokalemia: Secondary | ICD-10-CM | POA: Diagnosis present

## 2020-10-26 DIAGNOSIS — E860 Dehydration: Secondary | ICD-10-CM | POA: Diagnosis not present

## 2020-10-26 DIAGNOSIS — Z66 Do not resuscitate: Secondary | ICD-10-CM | POA: Diagnosis present

## 2020-10-26 DIAGNOSIS — D649 Anemia, unspecified: Secondary | ICD-10-CM | POA: Diagnosis present

## 2020-10-26 DIAGNOSIS — S91109A Unspecified open wound of unspecified toe(s) without damage to nail, initial encounter: Secondary | ICD-10-CM

## 2020-10-26 DIAGNOSIS — R7401 Elevation of levels of liver transaminase levels: Secondary | ICD-10-CM

## 2020-10-26 DIAGNOSIS — S91102A Unspecified open wound of left great toe without damage to nail, initial encounter: Secondary | ICD-10-CM | POA: Diagnosis not present

## 2020-10-26 DIAGNOSIS — Z853 Personal history of malignant neoplasm of breast: Secondary | ICD-10-CM | POA: Diagnosis not present

## 2020-10-26 DIAGNOSIS — E782 Mixed hyperlipidemia: Secondary | ICD-10-CM | POA: Diagnosis present

## 2020-10-26 DIAGNOSIS — E11649 Type 2 diabetes mellitus with hypoglycemia without coma: Secondary | ICD-10-CM | POA: Diagnosis present

## 2020-10-26 DIAGNOSIS — Z6832 Body mass index (BMI) 32.0-32.9, adult: Secondary | ICD-10-CM

## 2020-10-26 DIAGNOSIS — K529 Noninfective gastroenteritis and colitis, unspecified: Secondary | ICD-10-CM | POA: Diagnosis present

## 2020-10-26 DIAGNOSIS — E119 Type 2 diabetes mellitus without complications: Secondary | ICD-10-CM | POA: Diagnosis present

## 2020-10-26 DIAGNOSIS — Z833 Family history of diabetes mellitus: Secondary | ICD-10-CM

## 2020-10-26 DIAGNOSIS — R059 Cough, unspecified: Secondary | ICD-10-CM

## 2020-10-26 DIAGNOSIS — R161 Splenomegaly, not elsewhere classified: Secondary | ICD-10-CM | POA: Diagnosis present

## 2020-10-26 DIAGNOSIS — C229 Malignant neoplasm of liver, not specified as primary or secondary: Secondary | ICD-10-CM

## 2020-10-26 DIAGNOSIS — F419 Anxiety disorder, unspecified: Secondary | ICD-10-CM | POA: Diagnosis present

## 2020-10-26 DIAGNOSIS — Z9109 Other allergy status, other than to drugs and biological substances: Secondary | ICD-10-CM

## 2020-10-26 DIAGNOSIS — Z79899 Other long term (current) drug therapy: Secondary | ICD-10-CM

## 2020-10-26 DIAGNOSIS — R2681 Unsteadiness on feet: Secondary | ICD-10-CM | POA: Diagnosis present

## 2020-10-26 LAB — URINALYSIS, ROUTINE W REFLEX MICROSCOPIC
Bacteria, UA: NONE SEEN
Glucose, UA: NEGATIVE mg/dL
Ketones, ur: NEGATIVE mg/dL
Leukocytes,Ua: NEGATIVE
Nitrite: NEGATIVE
Protein, ur: NEGATIVE mg/dL
Specific Gravity, Urine: 1.014 (ref 1.005–1.030)
pH: 5 (ref 5.0–8.0)

## 2020-10-26 LAB — CBC WITH DIFFERENTIAL/PLATELET
Abs Immature Granulocytes: 0.1 10*3/uL — ABNORMAL HIGH (ref 0.00–0.07)
Basophils Absolute: 0.1 10*3/uL (ref 0.0–0.1)
Basophils Relative: 1 %
Eosinophils Absolute: 0.2 10*3/uL (ref 0.0–0.5)
Eosinophils Relative: 3 %
HCT: 33.9 % — ABNORMAL LOW (ref 36.0–46.0)
Hemoglobin: 12 g/dL (ref 12.0–15.0)
Immature Granulocytes: 1 %
Lymphocytes Relative: 15 %
Lymphs Abs: 1.2 10*3/uL (ref 0.7–4.0)
MCH: 30.1 pg (ref 26.0–34.0)
MCHC: 35.4 g/dL (ref 30.0–36.0)
MCV: 85 fL (ref 80.0–100.0)
Monocytes Absolute: 0.8 10*3/uL (ref 0.1–1.0)
Monocytes Relative: 10 %
Neutro Abs: 5.7 10*3/uL (ref 1.7–7.7)
Neutrophils Relative %: 70 %
Platelets: 159 10*3/uL (ref 150–400)
RBC: 3.99 MIL/uL (ref 3.87–5.11)
RDW: 21.1 % — ABNORMAL HIGH (ref 11.5–15.5)
WBC: 8.1 10*3/uL (ref 4.0–10.5)
nRBC: 0 % (ref 0.0–0.2)

## 2020-10-26 LAB — COMPREHENSIVE METABOLIC PANEL
ALT: 78 U/L — ABNORMAL HIGH (ref 0–44)
AST: 136 U/L — ABNORMAL HIGH (ref 15–41)
Albumin: 1.8 g/dL — ABNORMAL LOW (ref 3.5–5.0)
Alkaline Phosphatase: 201 U/L — ABNORMAL HIGH (ref 38–126)
Anion gap: 10 (ref 5–15)
BUN: 17 mg/dL (ref 8–23)
CO2: 22 mmol/L (ref 22–32)
Calcium: 8.8 mg/dL — ABNORMAL LOW (ref 8.9–10.3)
Chloride: 92 mmol/L — ABNORMAL LOW (ref 98–111)
Creatinine, Ser: 0.97 mg/dL (ref 0.44–1.00)
GFR, Estimated: >60 mL/min (ref >60)
Glucose, Bld: 73 mg/dL (ref 70–99)
Potassium: 3.2 mmol/L — ABNORMAL LOW (ref 3.5–5.1)
Sodium: 124 mmol/L — ABNORMAL LOW (ref 135–145)
Total Bilirubin: 8.9 mg/dL — ABNORMAL HIGH (ref 0.3–1.2)
Total Protein: 6.9 g/dL (ref 6.5–8.1)

## 2020-10-26 LAB — LIPASE, BLOOD: Lipase: 47 U/L (ref 11–51)

## 2020-10-26 MED ORDER — SODIUM CHLORIDE 0.9 % IV BOLUS
1000.0000 mL | Freq: Once | INTRAVENOUS | Status: AC
  Administered 2020-10-26: 1000 mL via INTRAVENOUS

## 2020-10-26 MED ORDER — CLONIDINE HCL 0.1 MG PO TABS: 0.1000 mg | ORAL_TABLET | Freq: Once | ORAL | Status: DC

## 2020-10-26 MED ORDER — PANTOPRAZOLE SODIUM 40 MG IV SOLR
40.0000 mg | Freq: Once | INTRAVENOUS | Status: AC
  Administered 2020-10-26: 40 mg via INTRAVENOUS
  Filled 2020-10-26: qty 40

## 2020-10-26 MED ORDER — IOHEXOL 300 MG/ML  SOLN
100.0000 mL | Freq: Once | INTRAMUSCULAR | Status: AC | PRN
  Administered 2020-10-26: 100 mL via INTRAVENOUS

## 2020-10-26 NOTE — ED Triage Notes (Signed)
Pt reports she saw her PCP today for dehydration, jaundice and weakness/fatigue; was told to come to ER for eval

## 2020-10-26 NOTE — Telephone Encounter (Signed)
Forwarded to Darius Bump to schedule

## 2020-10-26 NOTE — Telephone Encounter (Signed)
Thank you so much for your effort

## 2020-10-26 NOTE — ED Provider Notes (Signed)
[facility] EMERGENCY DEPARTMENT Provider Note   CSN: [id number] Arrival date & time: 10/26/20  1740     History Chief Complaint  Patient presents with  . Dehydration    Taylor Williams is a 70 y.o. female.  Patient complains of 3 weeks of vomiting and abdominal pain.  She has a history of liver disease and sees GI doctor.  The history is provided by the patient and medical records. No language interpreter was used.  Abdominal Pain Pain location:  Epigastric Pain quality: aching   Pain radiates to:  Does not radiate Pain severity:  Mild Onset quality:  Sudden Timing:  Constant Progression:  Waxing and waning Chronicity:  New Context: not alcohol use   Associated symptoms: no chest pain, no cough, no diarrhea, no fatigue and no hematuria        Past Medical History:  Diagnosis Date  . Anemia   . Anxiety   . Breast cancer ([place])   . 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 Complication of anesthesia    Recall during general anesthesia/ankle surgery/ 'light weight'  very sensitive to narcotics  . Depression   . Diabetes mellitus without complication ([place])   . High cholesterol   . Hypertension     Patient Active Problem List   Diagnosis Date Noted  . Bilateral leg edema 03/07/2020  . Cirrhosis, non-alcoholic ([place]) 50/93/2671  . Hepatic cirrhosis ([place]) 02/23/2019  . Varicose veins of bilateral lower extremities with other complications 24/58/0998  . Solitary pulmonary nodule 07/25/2016  . High blood sugar 02/02/2016  . Diarrhea 01/19/2016  . Carcinoma of upper-outer quadrant of left female breast ([place]) 01/01/2016    Past Surgical History:  Procedure Laterality Date  . BIOPSY  04/19/2019   Procedure: BIOPSY;  Surgeon: Rogene Houston, MD;  Location: [facility];  Service: Endoscopy;;  esophageal  . BREAST LUMPECTOMY Left    states for ductal carcinoma, had surgery in late may  . COLONOSCOPY    . ENDOVENOUS ABLATION SAPHENOUS VEIN W/ LASER Right  04/15/2018   endovenous laser ablation R GSV by Ruta Hinds MD   . ENDOVENOUS ABLATION SAPHENOUS VEIN W/ LASER Left 05/13/2018   endovenous laser ablation left greater saphenous vein by Ruta Hinds MD   . ESOPHAGOGASTRODUODENOSCOPY (EGD) WITH PROPOFOL N/A 04/19/2019   Procedure: ESOPHAGOGASTRODUODENOSCOPY (EGD) WITH PROPOFOL;  Surgeon: Rogene Houston, MD;  Location: [facility];  Service: Endoscopy;  Laterality: N/A;  7:30  . galbladder    . GANGLION CYST EXCISION    . TONSILLECTOMY AND ADENOIDECTOMY       OB History   No obstetric history on file.     Family History  Problem Relation Age of Onset  . Cancer - Other Mother   . Hypertension Father   . Diabetes Father   . Dementia Father   . Hyperthyroidism Brother   . Cancer Paternal Grandmother   . Cancer Paternal Grandfather     Social History   Tobacco Use  . Smoking status: Current Every Day Smoker    Packs/day: 1.00    Years: 40.00    Pack years: 40.00    Start date: 10/28/1965  . Smokeless tobacco: Never Used  Vaping Use  . Vaping Use: Never used  Substance Use Topics  . Alcohol use: No    Comment: 2 - 3 mixed drink yearly  . Drug use: No    Home Medications Prior to Admission medications   Medication Sig Start  Date End Date Taking? Authorizing Provider  ALPRAZolam (XANAX) 0.25 MG tablet Take 0.25 mg by mouth 2 (two) times daily.    [provider]  cetirizine (ZYRTEC) 10 MG tablet Take 10 mg by mouth at bedtime.     [provider]  citalopram (CELEXA) 20 MG tablet Take 20 mg by mouth daily.    [provider]  dapagliflozin propanediol (FARXIGA) 5 MG TABS tablet Take 5 mg by mouth daily.    [provider]  doxycycline (MONODOX) 50 MG capsule Take 50 mg by mouth daily.    [provider]  ergocalciferol (VITAMIN D2) 1.25 MG (50000 UT) capsule Take 1 capsule (50,000 Units total) by mouth once a week. Patient not taking: Reported on 03/07/2020 02/16/20    Francene Finders L, NP-C  furosemide (LASIX) 20 MG tablet Take 1 tablet (20 mg total) by mouth daily. 09/08/19   Laurine Blazer B, PA-C  glipiZIDE (GLUCOTROL) 10 MG tablet Take 10 mg by mouth 2 (two) times daily before a meal.    [provider]  hydrocortisone 2.5 % cream Apply 1 application topically at bedtime.     [provider]  insulin glargine (LANTUS) 100 UNIT/ML injection Inject 45 Units into the skin at bedtime.     [provider]  ketoconazole (NIZORAL) 2 % cream Apply 1 application topically daily.     [provider]  Javier Docker Oil 500 MG CAPS Take 1,500 mg by mouth 2 (two) times daily.    [provider]  lisinopril (PRINIVIL,ZESTRIL) 5 MG tablet Take 5 mg by mouth daily.    [provider]  loperamide (IMODIUM A-D) 2 MG tablet Take 2-4 mg by mouth 4 (four) times daily as needed for diarrhea or loose stools.  Patient not taking: Reported on 02/02/2020    [provider]  metFORMIN (GLUCOPHAGE) 1000 MG tablet Take 1,000 mg by mouth 2 (two) times daily with a meal.    [provider]  potassium chloride SA (K-DUR,KLOR-CON) 20 MEQ tablet Take 1 tablet (20 mEq total) by mouth daily. 05/31/16   Penland, Kelby Fam, MD  sitaGLIPtin (JANUVIA) 100 MG tablet Take 100 mg by mouth daily.    [provider]  spironolactone (ALDACTONE) 25 MG tablet Take 1 tablet (25 mg total) by mouth daily. 09/08/19   Ezzard Standing, PA-C  venlafaxine XR (EFFEXOR-XR) 75 MG 24 hr capsule Take 75 mg by mouth 2 (two) times daily.    [provider]    Allergies    Aspirin, Other, Fish allergy, and Procaine  Review of Systems   Review of Systems  Constitutional: Negative for appetite change and fatigue.  HENT: Negative for congestion, ear discharge and sinus pressure.   Eyes: Negative for discharge.  Respiratory: Negative for cough.   Cardiovascular: Negative for chest pain.  Gastrointestinal: Positive for abdominal pain.  Negative for diarrhea.  Genitourinary: Negative for frequency and hematuria.  Musculoskeletal: Negative for back pain.  Skin: Negative for rash.  Neurological: Negative for seizures and headaches.  Psychiatric/Behavioral: Negative for hallucinations.    Physical Exam Updated Vital Signs BP (!) 130/54   Pulse 72   Temp 98.1 F (36.7 C) (Oral)   Resp 17   Ht 5' 8"  (1.727 m)   Wt 96.2 kg   SpO2 98%   BMI 32.23 kg/m   Physical Exam Vitals and nursing note reviewed.  Constitutional:      Appearance: Normal appearance. She is well-developed.  HENT:  Head: Normocephalic.     Nose: Nose normal.  Eyes:     General: No scleral icterus.    Conjunctiva/sclera: Conjunctivae normal.  Neck:     Thyroid: No thyromegaly.  Cardiovascular:     Rate and Rhythm: Normal rate and regular rhythm.     Heart sounds: No murmur heard. No friction rub. No gallop.   Pulmonary:     Breath sounds: No stridor. No wheezing or rales.  Chest:     Chest wall: No tenderness.  Abdominal:     General: There is no distension.     Tenderness: There is abdominal tenderness. There is no rebound.  Musculoskeletal:        General: Normal range of motion.     Cervical back: Neck supple.  Lymphadenopathy:     Cervical: No cervical adenopathy.  Skin:    Findings: No erythema or rash.  Neurological:     Mental Status: She is alert and oriented to person, place, and time.     Motor: No abnormal muscle tone.     Coordination: Coordination normal.  Psychiatric:        Behavior: Behavior normal.     ED Results / Procedures / Treatments   Labs (all labs ordered are listed, but only abnormal results are displayed) Labs Reviewed  CBC WITH DIFFERENTIAL/PLATELET - Abnormal; Notable for the following components:      Result Value   HCT 33.9 (*)    RDW 21.1 (*)    Abs Immature Granulocytes 0.10 (*)    All other components within normal limits  COMPREHENSIVE METABOLIC PANEL - Abnormal; Notable for the  following components:   Sodium 124 (*)    Potassium 3.2 (*)    Chloride 92 (*)    Calcium 8.8 (*)    Albumin 1.8 (*)    AST 136 (*)    ALT 78 (*)    Alkaline Phosphatase 201 (*)    Total Bilirubin 8.9 (*)    All other components within normal limits  URINALYSIS, ROUTINE W REFLEX MICROSCOPIC - Abnormal; Notable for the following components:   Color, Urine AMBER (*)    Hgb urine dipstick SMALL (*)    Bilirubin Urine SMALL (*)    All other components within normal limits  LIPASE, BLOOD    EKG None  Radiology No results found.  Procedures Procedures   Medications Ordered in ED Medications  iohexol (OMNIPAQUE) 300 MG/ML solution 100 mL (has no administration in time range)  sodium chloride 0.9 % bolus 1,000 mL (0 mLs Intravenous Stopped 10/26/20 2216)  pantoprazole (PROTONIX) injection 40 mg (40 mg Intravenous Given 10/26/20 2133)    ED Course  I have reviewed the triage vital signs and the nursing notes.  Pertinent labs & imaging results that were available during my care of the patient were reviewed by me and considered in my medical decision making (see chart for details). Patient with hyponatremia and significantly elevated bilirubin to about 9.  We will get a CT of her abdomen and range from admission to medicine   MDM Rules/Calculators/A&P                           Final Clinical Impression(s) / ED Diagnoses Final diagnoses:  None    Rx / DC Orders ED Discharge Orders    None       Milton Ferguson, MD 10/29/20 1645

## 2020-10-26 NOTE — Telephone Encounter (Signed)
Taylor Williams is scheduled for CT of Liver on 10/31/20 at 11:30 am and she is aware

## 2020-10-27 ENCOUNTER — Inpatient Hospital Stay (HOSPITAL_COMMUNITY): Payer: Medicare Other

## 2020-10-27 ENCOUNTER — Encounter (HOSPITAL_COMMUNITY): Payer: Self-pay | Admitting: Family Medicine

## 2020-10-27 DIAGNOSIS — D649 Anemia, unspecified: Secondary | ICD-10-CM | POA: Diagnosis present

## 2020-10-27 DIAGNOSIS — F17210 Nicotine dependence, cigarettes, uncomplicated: Secondary | ICD-10-CM | POA: Diagnosis present

## 2020-10-27 DIAGNOSIS — I81 Portal vein thrombosis: Secondary | ICD-10-CM | POA: Diagnosis not present

## 2020-10-27 DIAGNOSIS — R17 Unspecified jaundice: Secondary | ICD-10-CM | POA: Diagnosis not present

## 2020-10-27 DIAGNOSIS — Z720 Tobacco use: Secondary | ICD-10-CM

## 2020-10-27 DIAGNOSIS — E860 Dehydration: Secondary | ICD-10-CM | POA: Diagnosis present

## 2020-10-27 DIAGNOSIS — Z9109 Other allergy status, other than to drugs and biological substances: Secondary | ICD-10-CM | POA: Diagnosis not present

## 2020-10-27 DIAGNOSIS — E11649 Type 2 diabetes mellitus with hypoglycemia without coma: Secondary | ICD-10-CM | POA: Diagnosis present

## 2020-10-27 DIAGNOSIS — R7401 Elevation of levels of liver transaminase levels: Secondary | ICD-10-CM

## 2020-10-27 DIAGNOSIS — E43 Unspecified severe protein-calorie malnutrition: Secondary | ICD-10-CM

## 2020-10-27 DIAGNOSIS — E876 Hypokalemia: Secondary | ICD-10-CM

## 2020-10-27 DIAGNOSIS — R188 Other ascites: Secondary | ICD-10-CM | POA: Diagnosis not present

## 2020-10-27 DIAGNOSIS — K7581 Nonalcoholic steatohepatitis (NASH): Secondary | ICD-10-CM | POA: Diagnosis not present

## 2020-10-27 DIAGNOSIS — E871 Hypo-osmolality and hyponatremia: Secondary | ICD-10-CM

## 2020-10-27 DIAGNOSIS — L03113 Cellulitis of right upper limb: Secondary | ICD-10-CM | POA: Diagnosis not present

## 2020-10-27 DIAGNOSIS — C229 Malignant neoplasm of liver, not specified as primary or secondary: Secondary | ICD-10-CM | POA: Diagnosis not present

## 2020-10-27 DIAGNOSIS — I82890 Acute embolism and thrombosis of other specified veins: Secondary | ICD-10-CM | POA: Diagnosis not present

## 2020-10-27 DIAGNOSIS — R1010 Upper abdominal pain, unspecified: Secondary | ICD-10-CM

## 2020-10-27 DIAGNOSIS — K766 Portal hypertension: Secondary | ICD-10-CM | POA: Diagnosis present

## 2020-10-27 DIAGNOSIS — Z853 Personal history of malignant neoplasm of breast: Secondary | ICD-10-CM | POA: Diagnosis not present

## 2020-10-27 DIAGNOSIS — M6281 Muscle weakness (generalized): Secondary | ICD-10-CM | POA: Diagnosis not present

## 2020-10-27 DIAGNOSIS — F419 Anxiety disorder, unspecified: Secondary | ICD-10-CM | POA: Diagnosis present

## 2020-10-27 DIAGNOSIS — Z8249 Family history of ischemic heart disease and other diseases of the circulatory system: Secondary | ICD-10-CM | POA: Diagnosis not present

## 2020-10-27 DIAGNOSIS — C801 Malignant (primary) neoplasm, unspecified: Secondary | ICD-10-CM | POA: Diagnosis not present

## 2020-10-27 DIAGNOSIS — Z66 Do not resuscitate: Secondary | ICD-10-CM | POA: Diagnosis present

## 2020-10-27 DIAGNOSIS — R197 Diarrhea, unspecified: Secondary | ICD-10-CM | POA: Diagnosis not present

## 2020-10-27 DIAGNOSIS — R41841 Cognitive communication deficit: Secondary | ICD-10-CM | POA: Diagnosis not present

## 2020-10-27 DIAGNOSIS — Z79899 Other long term (current) drug therapy: Secondary | ICD-10-CM | POA: Diagnosis not present

## 2020-10-27 DIAGNOSIS — Z886 Allergy status to analgesic agent status: Secondary | ICD-10-CM | POA: Diagnosis not present

## 2020-10-27 DIAGNOSIS — C220 Liver cell carcinoma: Secondary | ICD-10-CM | POA: Diagnosis present

## 2020-10-27 DIAGNOSIS — D015 Carcinoma in situ of liver, gallbladder and bile ducts: Secondary | ICD-10-CM | POA: Diagnosis not present

## 2020-10-27 DIAGNOSIS — I10 Essential (primary) hypertension: Secondary | ICD-10-CM | POA: Diagnosis present

## 2020-10-27 DIAGNOSIS — Z20822 Contact with and (suspected) exposure to covid-19: Secondary | ICD-10-CM | POA: Diagnosis present

## 2020-10-27 DIAGNOSIS — K529 Noninfective gastroenteritis and colitis, unspecified: Secondary | ICD-10-CM | POA: Diagnosis present

## 2020-10-27 DIAGNOSIS — R2689 Other abnormalities of gait and mobility: Secondary | ICD-10-CM | POA: Diagnosis not present

## 2020-10-27 DIAGNOSIS — Z833 Family history of diabetes mellitus: Secondary | ICD-10-CM | POA: Diagnosis not present

## 2020-10-27 DIAGNOSIS — K7460 Unspecified cirrhosis of liver: Secondary | ICD-10-CM | POA: Diagnosis present

## 2020-10-27 DIAGNOSIS — R600 Localized edema: Secondary | ICD-10-CM | POA: Diagnosis not present

## 2020-10-27 DIAGNOSIS — E119 Type 2 diabetes mellitus without complications: Secondary | ICD-10-CM

## 2020-10-27 DIAGNOSIS — E46 Unspecified protein-calorie malnutrition: Secondary | ICD-10-CM | POA: Diagnosis not present

## 2020-10-27 DIAGNOSIS — R918 Other nonspecific abnormal finding of lung field: Secondary | ICD-10-CM | POA: Diagnosis not present

## 2020-10-27 DIAGNOSIS — F32A Depression, unspecified: Secondary | ICD-10-CM | POA: Diagnosis present

## 2020-10-27 DIAGNOSIS — K7469 Other cirrhosis of liver: Secondary | ICD-10-CM | POA: Diagnosis not present

## 2020-10-27 DIAGNOSIS — M7989 Other specified soft tissue disorders: Secondary | ICD-10-CM | POA: Diagnosis not present

## 2020-10-27 LAB — CBC WITH DIFFERENTIAL/PLATELET
Abs Immature Granulocytes: 0.06 10*3/uL (ref 0.00–0.07)
Basophils Absolute: 0 10*3/uL (ref 0.0–0.1)
Basophils Relative: 1 %
Eosinophils Absolute: 0.2 10*3/uL (ref 0.0–0.5)
Eosinophils Relative: 4 %
HCT: 30.7 % — ABNORMAL LOW (ref 36.0–46.0)
Hemoglobin: 10.8 g/dL — ABNORMAL LOW (ref 12.0–15.0)
Immature Granulocytes: 1 %
Lymphocytes Relative: 21 %
Lymphs Abs: 1.2 10*3/uL (ref 0.7–4.0)
MCH: 29.4 pg (ref 26.0–34.0)
MCHC: 35.2 g/dL (ref 30.0–36.0)
MCV: 83.7 fL (ref 80.0–100.0)
Monocytes Absolute: 0.8 10*3/uL (ref 0.1–1.0)
Monocytes Relative: 15 %
Neutro Abs: 3.2 10*3/uL (ref 1.7–7.7)
Neutrophils Relative %: 58 %
Platelets: 143 10*3/uL — ABNORMAL LOW (ref 150–400)
RBC: 3.67 MIL/uL — ABNORMAL LOW (ref 3.87–5.11)
RDW: 20.7 % — ABNORMAL HIGH (ref 11.5–15.5)
WBC: 5.6 10*3/uL (ref 4.0–10.5)
nRBC: 0 % (ref 0.0–0.2)

## 2020-10-27 LAB — COMPREHENSIVE METABOLIC PANEL
ALT: 66 U/L — ABNORMAL HIGH (ref 0–44)
AST: 117 U/L — ABNORMAL HIGH (ref 15–41)
Albumin: 1.5 g/dL — ABNORMAL LOW (ref 3.5–5.0)
Alkaline Phosphatase: 167 U/L — ABNORMAL HIGH (ref 38–126)
Anion gap: 9 (ref 5–15)
BUN: 15 mg/dL (ref 8–23)
CO2: 21 mmol/L — ABNORMAL LOW (ref 22–32)
Calcium: 8.3 mg/dL — ABNORMAL LOW (ref 8.9–10.3)
Chloride: 95 mmol/L — ABNORMAL LOW (ref 98–111)
Creatinine, Ser: 0.71 mg/dL (ref 0.44–1.00)
GFR, Estimated: >60 mL/min (ref >60)
Glucose, Bld: 40 mg/dL — CL (ref 70–99)
Potassium: 3.3 mmol/L — ABNORMAL LOW (ref 3.5–5.1)
Sodium: 125 mmol/L — ABNORMAL LOW (ref 135–145)
Total Bilirubin: 7.1 mg/dL — ABNORMAL HIGH (ref 0.3–1.2)
Total Protein: 5.9 g/dL — ABNORMAL LOW (ref 6.5–8.1)

## 2020-10-27 LAB — BILIRUBIN, FRACTIONATED(TOT/DIR/INDIR)
Bilirubin, Direct: 3.8 mg/dL — ABNORMAL HIGH (ref 0.0–0.2)
Indirect Bilirubin: 3.9 mg/dL — ABNORMAL HIGH (ref 0.3–0.9)
Total Bilirubin: 7.7 mg/dL — ABNORMAL HIGH (ref 0.3–1.2)

## 2020-10-27 LAB — SARS CORONAVIRUS 2 (TAT 6-24 HRS): SARS Coronavirus 2: NEGATIVE

## 2020-10-27 LAB — GLUCOSE, CAPILLARY
Glucose-Capillary: 52 mg/dL — ABNORMAL LOW (ref 70–99)
Glucose-Capillary: 75 mg/dL (ref 70–99)

## 2020-10-27 LAB — PROTIME-INR
INR: 1.6 — ABNORMAL HIGH (ref 0.8–1.2)
Prothrombin Time: 18.6 seconds — ABNORMAL HIGH (ref 11.4–15.2)

## 2020-10-27 LAB — MAGNESIUM: Magnesium: 1.7 mg/dL (ref 1.7–2.4)

## 2020-10-27 LAB — HEMOGLOBIN A1C
Hgb A1c MFr Bld: 7.6 % — ABNORMAL HIGH (ref 4.8–5.6)
Mean Plasma Glucose: 171.42 mg/dL

## 2020-10-27 LAB — AMMONIA: Ammonia: 40 umol/L — ABNORMAL HIGH (ref 9–35)

## 2020-10-27 LAB — HIV ANTIBODY (ROUTINE TESTING W REFLEX): HIV Screen 4th Generation wRfx: NONREACTIVE

## 2020-10-27 MED ORDER — ALPRAZOLAM 0.25 MG PO TABS
0.2500 mg | ORAL_TABLET | Freq: Two times a day (BID) | ORAL | Status: DC
  Administered 2020-10-27 – 2020-11-02 (×13): 0.25 mg via ORAL
  Filled 2020-10-27 (×13): qty 1

## 2020-10-27 MED ORDER — PANTOPRAZOLE SODIUM 40 MG PO TBEC
40.0000 mg | DELAYED_RELEASE_TABLET | Freq: Once | ORAL | Status: AC
  Administered 2020-10-27: 40 mg via ORAL
  Filled 2020-10-27: qty 1

## 2020-10-27 MED ORDER — HEPARIN SODIUM (PORCINE) 5000 UNIT/ML IJ SOLN
5000.0000 [IU] | Freq: Three times a day (TID) | INTRAMUSCULAR | Status: DC
  Administered 2020-10-27 – 2020-11-02 (×18): 5000 [IU] via SUBCUTANEOUS
  Filled 2020-10-27 (×20): qty 1

## 2020-10-27 MED ORDER — OXYCODONE HCL 5 MG PO TABS: 5.0000 mg | ORAL_TABLET | ORAL | Status: DC | PRN

## 2020-10-27 MED ORDER — CITALOPRAM HYDROBROMIDE 20 MG PO TABS
20.0000 mg | ORAL_TABLET | Freq: Every day | ORAL | Status: DC
  Administered 2020-10-27 – 2020-11-02 (×7): 20 mg via ORAL
  Filled 2020-10-27 (×7): qty 1

## 2020-10-27 MED ORDER — CHLORHEXIDINE GLUCONATE CLOTH 2 % EX PADS
6.0000 | MEDICATED_PAD | Freq: Every day | CUTANEOUS | Status: DC
  Administered 2020-10-28 – 2020-10-31 (×4): 6 via TOPICAL

## 2020-10-27 MED ORDER — SPIRONOLACTONE 25 MG PO TABS
25.0000 mg | ORAL_TABLET | Freq: Every day | ORAL | Status: DC
  Administered 2020-10-27 – 2020-11-02 (×7): 25 mg via ORAL
  Filled 2020-10-27 (×7): qty 1

## 2020-10-27 MED ORDER — LISINOPRIL 5 MG PO TABS
5.0000 mg | ORAL_TABLET | Freq: Every day | ORAL | Status: DC
  Administered 2020-10-27 – 2020-11-01 (×6): 5 mg via ORAL
  Filled 2020-10-27 (×7): qty 1

## 2020-10-27 MED ORDER — ENSURE ENLIVE PO LIQD
237.0000 mL | Freq: Two times a day (BID) | ORAL | Status: DC
  Administered 2020-10-27 – 2020-11-02 (×5): 237 mL via ORAL

## 2020-10-27 MED ORDER — IOHEXOL 300 MG/ML  SOLN
100.0000 mL | Freq: Once | INTRAMUSCULAR | Status: AC | PRN
  Administered 2020-10-27: 100 mL via INTRAVENOUS

## 2020-10-27 MED ORDER — IBUPROFEN 400 MG PO TABS: 400.0000 mg | ORAL_TABLET | Freq: Four times a day (QID) | ORAL | Status: DC | PRN

## 2020-10-27 MED ORDER — IOHEXOL 9 MG/ML PO SOLN
ORAL | Status: AC
  Filled 2020-10-27: qty 1000

## 2020-10-27 MED ORDER — ONDANSETRON HCL 4 MG/2ML IJ SOLN
4.0000 mg | Freq: Four times a day (QID) | INTRAMUSCULAR | Status: DC | PRN
  Administered 2020-10-29: 4 mg via INTRAVENOUS
  Filled 2020-10-27: qty 2

## 2020-10-27 MED ORDER — PROSOURCE PLUS PO LIQD
30.0000 mL | Freq: Two times a day (BID) | ORAL | Status: DC
  Administered 2020-10-28 – 2020-11-02 (×11): 30 mL via ORAL
  Filled 2020-10-27 (×13): qty 30

## 2020-10-27 MED ORDER — ONDANSETRON HCL 4 MG PO TABS: 4.0000 mg | ORAL_TABLET | Freq: Four times a day (QID) | ORAL | Status: DC | PRN

## 2020-10-27 MED ORDER — PANTOPRAZOLE SODIUM 40 MG PO TBEC
40.0000 mg | DELAYED_RELEASE_TABLET | Freq: Every day | ORAL | Status: DC
  Administered 2020-10-28 – 2020-11-02 (×6): 40 mg via ORAL
  Filled 2020-10-27 (×6): qty 1

## 2020-10-27 MED ORDER — FUROSEMIDE 20 MG PO TABS
20.0000 mg | ORAL_TABLET | Freq: Every day | ORAL | Status: DC
  Administered 2020-10-27 – 2020-11-01 (×6): 20 mg via ORAL
  Filled 2020-10-27 (×7): qty 1

## 2020-10-27 MED ORDER — NICOTINE 21 MG/24HR TD PT24
21.0000 mg | MEDICATED_PATCH | Freq: Every day | TRANSDERMAL | Status: DC
  Administered 2020-10-27 – 2020-11-02 (×7): 21 mg via TRANSDERMAL
  Filled 2020-10-27 (×7): qty 1

## 2020-10-27 MED ORDER — POTASSIUM CHLORIDE 10 MEQ/100ML IV SOLN
10.0000 meq | INTRAVENOUS | Status: AC
  Administered 2020-10-27 (×4): 10 meq via INTRAVENOUS
  Filled 2020-10-27: qty 100

## 2020-10-27 MED ORDER — VENLAFAXINE HCL ER 75 MG PO CP24
75.0000 mg | ORAL_CAPSULE | Freq: Two times a day (BID) | ORAL | Status: DC
  Administered 2020-10-27 – 2020-11-02 (×13): 75 mg via ORAL
  Filled 2020-10-27 (×13): qty 1

## 2020-10-27 NOTE — Consult Note (Signed)
@LOGO @   Referring Provider: [facility] hospital[facility] Primary Care Physician:  Taylor Burly, MD Primary Gastroenterologist:  Dr. Laural Williams  Date of Admission: 10/26/20 Date of Consultation: 10/27/20   Reason for Consultation:  Jaundice, history of cirrhosis  HPI:  Taylor Williams is a 70 y.o. year old female with history of NASH cirrhosis with no evidence of esophageal or gastric varices thought she did have portal hypertensive gastropathy on EGD in 2020, elevated AFP first noted in February 2021, MRI liver December 2021 with at least 2 hepatic lesions of concern for hepatocellular carcinoma suboptimally characterized with recommendations for CT liver which has yet to be completed.  Also with history of breast cancer, diabetes, HTN, HLD, GERD, H. Pylori s/p treatment with pylera and documented eradication in Feb 2021.  She presented to the emergency room with chief complaint of epigastric and periumbilical abdominal pain and vomiting x3 weeks but has been progressively worsening.  Associated decreased appetite and diarrhea.  She has stopped taking all of her medications due to feeling poorly.  She had fallen twice within the last week due to unsteadiness and fatigue.  ED course: Temp 97.9, heart rate 72, respiratory rate 17, blood pressure 130/54, satting at 98% White blood cell count 8.1, hemoglobin 12.0 Hyponatremic with a sodium of 124, hypokalemic with of potassium 3.2 Alk phos 201,, bilirubin 8.9, ALT 78, AST 136, albumin 1.8, lipase 47.  CT abdomen and pelvis redemonstrating cirrhosis with portal venous hypertension manifested by extensive upper abdominal varices and borderline splenomegaly, new lesion within the lateral aspect of the left lobe liver concerning for hepatocellular carcinoma.  The remaining liver lesion seen within the left lobe on prior MRI not well visualized with recommendations for nonemergent multiphase liver CT.  Small volume ascites.  Mild diffuse colonic wall thickening,  nonspecific given underlying liver disease and ascites.   Today patient reports she started having similar symptoms as when she was diagnosed with H. pylori in 2020.  Symptoms included nausea, mid upper abdominal pain, and indigestion. Symptoms started 2-3 weeks ago. Used tums and this helped for a few minutes. PCP checked H. Pylori blood test which was positive. PCP prescribed antibiotics again last week. Started having vomiting and diarrhea after starting the antibiotics. Thinks she stopped antibiotics around Sunday due to feeling poorly. Hasn't been having any significant GERD symptoms or dysphagia.   No identified triggers. Not triggered by meals. Eating without trouble. No hematemesis. No melena or brbpr. Yesterday, she had 3-4 BMs.  Reports diarrhea was explosive, watery, uncontrollable. Prior to this, her stools were mushy with about 3 BMs daily, typically after meals. No BM today.  Diarrhea was not as bad yesterday.  No abdominal pain today. Pain was very vague yesterday. No nausea. No vomiting. Last vomited yesterday morning.  Had eggs, muffin, fruit cup, milk, and coffee for breakfast this morning and feels well at this time.   Taking lasix and spironolactone at home to help with lower extremity edema. This is at baseline. No swelling in her abdomen. Unsteady on her feet. Some foggy headedness but no real confusion. This has been going on for a couple of months.     No alcohol. No history of drug use. Rare use of tylenol. No NSAIDs.  Past Medical History:  Diagnosis Date  . Anemia   . Anxiety   . Breast cancer ([place])   . Cirrhosis of liver not due to alcohol ([place])   . Complication of anesthesia    Recall during general anesthesia/ankle surgery/ '  light weight'  very sensitive to narcotics  . Depression   . Diabetes mellitus without complication ([place])   . High cholesterol   . Hypertension     Past Surgical History:  Procedure Laterality Date  . BIOPSY  04/19/2019   Procedure:  BIOPSY;  Surgeon: Rogene Houston, MD;  Location: [facility];  Service: Endoscopy;;  esophageal  . BREAST LUMPECTOMY Left    states for ductal carcinoma, had surgery in late may  . COLONOSCOPY    . ENDOVENOUS ABLATION SAPHENOUS VEIN W/ LASER Right 04/15/2018   endovenous laser ablation R GSV by Ruta Hinds MD   . ENDOVENOUS ABLATION SAPHENOUS VEIN W/ LASER Left 05/13/2018   endovenous laser ablation left greater saphenous vein by Ruta Hinds MD   . ESOPHAGOGASTRODUODENOSCOPY (EGD) WITH PROPOFOL N/A 04/19/2019   Procedure: ESOPHAGOGASTRODUODENOSCOPY (EGD) WITH PROPOFOL;  Surgeon: Rogene Houston, MD;  Location: [facility];  Service: Endoscopy;  Laterality: N/A;  7:30  . galbladder    . GANGLION CYST EXCISION    . TONSILLECTOMY AND ADENOIDECTOMY      Prior to Admission medications   Medication Sig Start Date End Date Taking? Authorizing Provider  ALPRAZolam (XANAX) 0.25 MG tablet Take 0.25 mg by mouth 2 (two) times daily.    [provider]  cetirizine (ZYRTEC) 10 MG tablet Take 10 mg by mouth at bedtime.     [provider]  citalopram (CELEXA) 20 MG tablet Take 20 mg by mouth daily.    [provider]  dapagliflozin propanediol (FARXIGA) 5 MG TABS tablet Take 5 mg by mouth daily.    [provider]  doxycycline (MONODOX) 50 MG capsule Take 50 mg by mouth daily.    [provider]  ergocalciferol (VITAMIN D2) 1.25 MG (50000 UT) capsule Take 1 capsule (50,000 Units total) by mouth once a week. Patient not taking: Reported on 03/07/2020 02/16/20   Francene Finders L, NP-C  furosemide (LASIX) 20 MG tablet Take 1 tablet (20 mg total) by mouth daily. 09/08/19   Laurine Blazer B, PA-C  glipiZIDE (GLUCOTROL) 10 MG tablet Take 10 mg by mouth 2 (two) times daily before a meal.    [provider]  hydrocortisone 2.5 % cream Apply 1 application topically at bedtime.     [provider]  insulin glargine (LANTUS) 100 UNIT/ML  injection Inject 45 Units into the skin at bedtime.     [provider]  ketoconazole (NIZORAL) 2 % cream Apply 1 application topically daily.     [provider]  Taylor Docker Oil 500 MG CAPS Take 1,500 mg by mouth 2 (two) times daily.    [provider]  lisinopril (PRINIVIL,ZESTRIL) 5 MG tablet Take 5 mg by mouth daily.    [provider]  loperamide (IMODIUM A-Williams) 2 MG tablet Take 2-4 mg by mouth 4 (four) times daily as needed for diarrhea or loose stools.  Patient not taking: Reported on 02/02/2020    [provider]  metFORMIN (GLUCOPHAGE) 1000 MG tablet Take 1,000 mg by mouth 2 (two) times daily with a meal.    [provider]  potassium chloride SA (K-DUR,KLOR-CON) 20 MEQ tablet Take 1 tablet (20 mEq total) by mouth daily. 05/31/16   Penland, Kelby Fam, MD  sitaGLIPtin (JANUVIA) 100 MG tablet Take 100 mg by mouth daily.    [provider]  spironolactone (ALDACTONE) 25 MG tablet Take 1 tablet (25 mg total) by mouth daily. 09/08/19   Ezzard Standing,  PA-C  venlafaxine XR (EFFEXOR-XR) 75 MG 24 hr capsule Take 75 mg by mouth 2 (two) times daily.    [provider]    Current Facility-Administered Medications  Medication Dose Route Frequency Provider Last Rate Last Admin  . ALPRAZolam Duanne Moron) tablet 0.25 mg  0.25 mg Oral BID Zierle-Ghosh, Asia B, DO   0.25 mg at 10/27/20 0839  . citalopram (CELEXA) tablet 20 mg  20 mg Oral Daily Zierle-Ghosh, Asia B, DO   20 mg at 10/27/20 2952  . feeding supplement (ENSURE ENLIVE / ENSURE PLUS) liquid 237 mL  237 mL Oral BID BM Zierle-Ghosh, Asia B, DO      . furosemide (LASIX) tablet 20 mg  20 mg Oral Daily Zierle-Ghosh, Asia B, DO   20 mg at 10/27/20 0838  . heparin injection 5,000 Units  5,000 Units Subcutaneous Q8H Zierle-Ghosh, Asia B, DO      . ibuprofen (ADVIL) tablet 400 mg  400 mg Oral Q6H PRN Zierle-Ghosh, Asia B, DO      . lisinopril (ZESTRIL) tablet 5 mg  5 mg Oral Daily Zierle-Ghosh,  Asia B, DO   5 mg at 10/27/20 8413  . nicotine (NICODERM CQ - dosed in mg/24 hours) patch 21 mg  21 mg Transdermal Daily Zierle-Ghosh, Asia B, DO   21 mg at 10/27/20 0837  . ondansetron (ZOFRAN) tablet 4 mg  4 mg Oral Q6H PRN Zierle-Ghosh, Asia B, DO       Or  . ondansetron (ZOFRAN) injection 4 mg  4 mg Intravenous Q6H PRN Zierle-Ghosh, Asia B, DO      . oxyCODONE (Oxy IR/ROXICODONE) immediate release tablet 5 mg  5 mg Oral Q4H PRN Zierle-Ghosh, Asia B, DO      . [START ON 10/28/2020] pantoprazole (PROTONIX) EC tablet 40 mg  40 mg Oral QAC breakfast Jodi Mourning, Masiyah Engen S, PA-C      . spironolactone (ALDACTONE) tablet 25 mg  25 mg Oral Daily Zierle-Ghosh, Asia B, DO   25 mg at 10/27/20 2440  . venlafaxine XR (EFFEXOR-XR) 24 hr capsule 75 mg  75 mg Oral BID Zierle-Ghosh, Asia B, DO   75 mg at 10/27/20 0838    Allergies as of 10/26/2020 - Review Complete 10/26/2020  Allergen Reaction Noted  . Aspirin Swelling 01/01/2016  . Other Swelling 01/01/2016  . Fish allergy Other (See Comments) 01/01/2016  . Procaine  04/21/2017    Family History  Problem Relation Age of Onset  . Cancer - Other Mother   . Hypertension Father   . Diabetes Father   . Dementia Father   . Hyperthyroidism Brother   . Cancer Paternal Grandmother   . Cancer Paternal Grandfather     Social History   Socioeconomic History  . Marital status: Widowed    Spouse name: Not on file  . Number of children: Not on file  . Years of education: Not on file  . Highest education level: Not on file  Occupational History  . Not on file  Tobacco Use  . Smoking status: Current Every Day Smoker    Packs/day: 1.00    Years: 40.00    Pack years: 40.00    Start date: 10/28/1965  . Smokeless tobacco: Never Used  Vaping Use  . Vaping Use: Never used  Substance and Sexual Activity  . Alcohol use: No    Comment: 2 - 3 mixed drink yearly  . Drug use: No  . Sexual activity: Not on file  Other Topics Concern  .  Not on file  Social  History Narrative  . Not on file   Social Determinants of Health   Financial Resource Strain: Not on file  Food Insecurity: Not on file  Transportation Needs: Not on file  Physical Activity: Not on file  Stress: Not on file  Social Connections: Not on file  Intimate Partner Violence: Not on file    Review of Systems: Gen: Denies fever, chills, cold or flulike symptoms. CV: Denies chest pain or heart palpitations. Resp: Denies shortness of breath or cough. GI: See HPI GU : Denies dysuria. MS: Denies joint pain Psych: See HPI Heme: See HPI  Physical Exam: Vital signs in last 24 hours: Temp:  [97.2 F (36.2 C)-98.1 F (36.7 C)] 97.8 F (36.6 C) (04/01 0330) Pulse Rate:  [66-80] 72 (04/01 0330) Resp:  [16-24] 20 (04/01 0330) BP: (117-138)/(43-66) 117/43 (04/01 0330) SpO2:  [98 %-100 %] 100 % (04/01 0330) Weight:  [96.2 kg] 96.2 kg (03/31 1918) Last BM Date: 10/26/20 General:   Alert,  Well-developed, well-nourished, pleasant and cooperative in NAD Head:  Normocephalic and atraumatic. Eyes:  Sclera clear, no icterus.   Conjunctiva pink. Ears:  Normal auditory acuity. Lungs:  Clear throughout to auscultation.   No wheezes, crackles, or rhonchi. No acute distress. Heart:  Regular rate and rhythm; no murmurs, clicks, rubs,  or gallops. Abdomen:  Soft, very mild tenderness to palpation in the epigastric and RUQ. No masses, hepatosplenomegaly or hernias noted. Normal bowel sounds, without guarding, and without rebound.   Rectal:  Deferred  Msk:  Symmetrical without gross deformities.  Extremities:  With 1+ bilateral LE pitting edema.  Also with chronic venous stasis changes of skin of lower extremities. Neurologic:  Alert and  oriented x4;  grossly normal neurologically.  No asterixis. Psych: Normal mood and affect.  Intake/Output from previous day: 03/31 0701 - 04/01 0700 In: 1000 [IV Piggyback:1000] Out: -  Intake/Output this shift: Total I/O In: 480 [P.O.:480] Out: -    Lab Results: Recent Labs    10/26/20 2056 10/27/20 0414  WBC 8.1 5.6  HGB 12.0 10.8*  HCT 33.9* 30.7*  PLT 159 143*   BMET Recent Labs    10/26/20 2056 10/27/20 0414  NA 124* 125*  K 3.2* 3.3*  CL 92* 95*  CO2 22 21*  GLUCOSE 73 40*  BUN 17 15  CREATININE 0.97 0.71  CALCIUM 8.8* 8.3*   LFT Recent Labs    10/26/20 2056 10/27/20 0414 10/27/20 1111  PROT 6.9 5.9*  --   ALBUMIN 1.8* 1.5*  --   AST 136* 117*  --   ALT 78* 66*  --   ALKPHOS 201* 167*  --   BILITOT 8.9* 7.1* 7.7*  BILIDIR  --   --  3.8*  IBILI  --   --  3.9*   PT/INR Recent Labs    10/27/20 0414  LABPROT 18.6*  INR 1.6*    Studies/Results: CT ABDOMEN PELVIS W CONTRAST  Result Date: 10/26/2020 CLINICAL DATA:  Dehydration, jaundice, weakness, fatigue, history of cirrhosis and breast cancer EXAM: CT ABDOMEN AND PELVIS WITH CONTRAST TECHNIQUE: Multidetector CT imaging of the abdomen and pelvis was performed using the standard protocol following bolus administration of intravenous contrast. CONTRAST:  18m OMNIPAQUE IOHEXOL 300 MG/ML  SOLN COMPARISON:  07/04/2020, 01/28/2020 FINDINGS: Lower chest: Partially visualized right middle lobe nodule on image 1 measuring 8 mm, corresponding to calcified nodule on previous chest CT. Remaining portions of the lung bases are clear. Hepatobiliary: Diffuse  changes of cirrhosis are again noted. The left lobe liver lesion seen on previous MRI are not well visualized on this single phase CT. There is a new hypodense lesion within the lateral aspect left lobe liver segment 3, measuring 3.2 cm image 30/2. Given previous MRI findings, this is concerning for new hepatocellular carcinoma. As per previous recommendation, nonemergent dedicated multiphase liver CT is recommended for further evaluation, if not performed in the interim. No intrahepatic duct dilation.  The gallbladder surgically absent. Pancreas: Unremarkable. No pancreatic ductal dilatation or surrounding  inflammatory changes. Spleen: Borderline splenomegaly measuring 12.8 cm in craniocaudal length. No focal abnormalities. Adrenals/Urinary Tract: Punctate less than 2 mm nonobstructing left renal calculus. No right-sided calculi. No obstructive uropathy within either kidney. Bladder is grossly normal. The left adrenal is not well visualized. Nodular thickening of the right adrenal gland is unchanged, nonspecific. Stomach/Bowel: No bowel obstruction or ileus. Scattered colonic diverticulosis without diverticulitis. There is mild diffuse colonic wall thickening, nonspecific given abdominal ascites and hypoproteinemic state related to cirrhosis. Inflammatory or infectious colitis cannot be excluded. There is a normal appendix in the right lower quadrant. Vascular/Lymphatic: Portal vein, splenic vein, and SMV are patent. Sequela of portal venous hypertension is noted, with marked splenic and gastric varices. Diffuse atherosclerosis throughout the aorta and its branches. No pathologic adenopathy. Reproductive: Uterus and bilateral adnexa are unremarkable. Other: Small volume ascites throughout the abdomen and pelvis, greatest surrounding the liver. No free intraperitoneal gas. No abdominal wall hernia. There is mild diffuse body wall edema. Musculoskeletal: No acute or destructive bony lesions. Reconstructed images demonstrate no additional findings. IMPRESSION: 1. Cirrhosis, with portal venous hypertension manifested by extensive upper abdominal varices and borderline splenomegaly. 2. New lesion within the lateral aspect of segment 3 left lobe liver, concerning for hepatocellular carcinoma. The remaining liver lesions seen within the left lobe on prior MRI are not well visualized on this single phase CT exam. Per previous MRI recommendation, nonemergent multiphase liver CT is again recommended if not performed in the interim. 3. Small volume ascites. 4. Mild diffuse colonic wall thickening, nonspecific given underlying  liver disease and ascites. Mild inflammatory or infectious colitis cannot be excluded. 5.  Aortic Atherosclerosis (ICD10-I70.0). Electronically Signed   By: Randa Ngo M.Williams.   On: 10/26/2020 23:54   DG Toe Great Left  Result Date: 10/27/2020 CLINICAL DATA:  Redness and swelling, initial encounter EXAM: LEFT GREAT TOE COMPARISON:  None. FINDINGS: There is no evidence of fracture or dislocation. There is no evidence of arthropathy or other focal bone abnormality. Soft tissues are unremarkable. IMPRESSION: No acute abnormality noted. Electronically Signed   By: Inez Catalina M.Williams.   On: 10/27/2020 02:48   DG Toe Great Right  Result Date: 10/27/2020 CLINICAL DATA:  Redness and swelling, initial encounter EXAM: RIGHT GREAT TOE COMPARISON:  None. FINDINGS: There is no evidence of fracture or dislocation. There is no evidence of arthropathy or other focal bone abnormality. Soft tissues are unremarkable. IMPRESSION: No acute abnormality noted. Electronically Signed   By: Inez Catalina M.Williams.   On: 10/27/2020 02:48    Impression: 70 year old female with history of breast cancer, diabetes, HTN, HLD, GERD, H. pylori s/p treatment with Pylera and documented eradication in February 2021, NASH cirrhosis with no evidence of esophageal or gastric varices thought she did have portal hypertensive gastropathy on EGD in 2020, progressive elevation of AFP first noted in February 2021, MRI liver December 2021 with at least 2 hepatic lesions of concern for hepatocellular carcinoma suboptimally  characterized with recommendations for CT liver which has yet to be completed.  Patient presented to the emergency room with chief complaint of upper abdominal pain, vomiting, and diarrhea.  She was found to have hyponatremia, hypokalemia, increasing LFTs and acute bump in bilirubin to 8.9 and alk phos of 201.  CT A/P with cirrhosis, portal venous hypertension, new lesion in the lateral aspect of left liver lobe concerning for hepatocellular  carcinoma.  Remaining liver lesions seen on previous MRI not well visualized.  Also with mild diffuse colonic wall thickening, nonspecific.  Hyperbilirubinemia and possible hepatocellular carcinoma in the setting of NASH cirrhosis: Meld 27 (INR 1.6, bilirubin 7.7 today, Na 125).  This is up from MELD 6 in July/August 2021. Ammonia slightly elevated at 40 though no overt encephalopathy.  She has been having diarrhea, so will hold off on starting lactulose for now. Chronic LE edema fairly well controlled with Lasix and Spironolactone. Bilirubin has come down to 7.7 today (direct 3.8 and indirect 3.9) and LFTs are also improving. Notably, her abdominal pain, nausea, and vomiting have also significantly improved.   Hyperbilirubinemia may be secondary hepatocellular carcinoma. She has history of cholecystectomy and there was no ductal dilation appreciated on her CT scan making choledocholithiasis/microlithiasis less likely, but can't rule this out in the setting of abdominal pain, nausea, and vomiting. Discussed MRI/MRCP with Dr. Jenetta Downer who recommended we hold off on this for now and proceed with CT liver to further characterize liver lesions/?hepatocellular carcinoma.   Upper abdominal pain: Patient reports 2-3-week history of intermittent epigastric abdominal pain, nausea, and indigestion similar to when she had H. pylori in 2020.  Denies association with meals.  Reports PCP started her on H. pylori treatment about 1 week ago and she later developed vomiting so she stopped taking the medications.  Denies any regular GERD symptoms and has not been on PPI.  Denies melena or BRBPR.  She does have history of esophagitis and duodenitis on EGD in 2020.  She was found to have acutely elevated bilirubin and alk phos of admission at 8.9 and 201 respectively, LFTs also increased. Lipase wnl. No ductal dilation on CT, and evidence of cholecystectomy. Interestingly, today she is feeling well. Her abdominal pain has  essentially resolved as well as nausea and vomiting.  Only with very mild TTP in the epigastric area and RUQ on exam.  Bilirubin down to 7.7 today with direct 3.8 and indirect 3.9.  Alk phos and LFTs also improving.  She remains afebrile.  Differentials include gastritis, esophagitis, duodenitis, PUD, and possibly microlithiasis.  Notably, there is concern for hepatocellular carcinoma on CT which could also be contributing to her upper GI symptoms.  Discussed with Dr. Jenetta Downer.  We will pursue CT liver today.  Hold off on MRI/MRCP. Will start her on PPI daily.   Diarrhea: New onset diarrhea after starting antibiotics for suspected H. pylori per PCP.  Patient denies any diarrhea today.  At baseline, she has about 3 mushy stools daily.  This is likely secondary to bile salt diarrhea.  She did have diffuse colonic wall thickening on her CT, but this is more of a chronic finding in the setting of liver disease.  Bowel thickening was also noted on MRI in December 2021.  Abdominal exam with very mild TTP in the epigastric and RUQ region.  She remains afebrile. Suspect acute diarrhea was antibiotic effect. Doubt infectious etiology. If diarrhea returns, will need to complete stool studies.   Anemia: Hemoglobin declined from 12 to  10.8 today.  No overt GI bleeding. No known esophageal varices with EGD in September 2020 though evidence of gastric varices and splenomegaly on CT. Colonoscopy in 2018 with tubular adenomas.  We will continue to monitor.     Plan: 1.  CT Liver Protocol today.  2.  Fractionate bilirubin 3.  CBC, BMP, HFP, INR daily.  4.  Start Protonix 40 mg daily.  5.  Correction of electrolytes per hospitalist.  6.  Monitor for change in mental status.  7.  Monitor for overt GI bleeding.   8.  Monitor for return of diarrhea. If this occurs, check C. Diff and GI pathogen panel.  9.  Continue lasix and spironolactone.   10.  Supportive measures.    LOS: 0 days    10/27/2020, 12:26  PM   Aliene Altes, [facility]

## 2020-10-27 NOTE — Progress Notes (Signed)
Laboratory called and notified staff of patient CBG of 40.  Patient arousable and alert and hypoglyemic protocol initiated.  Will recheck cbg in approximately 15 minutes and continue to monitor patient.

## 2020-10-27 NOTE — ED Provider Notes (Signed)
Care assumed from Dr. Roderic Palau at shift change.  Patient awaiting results of a CT scan to further evaluate her elevated LFTs and sudden increase in her bilirubin.  CT scan has returned and shows a new lesion in the left lobe of the liver concerning for hepatocellular carcinoma.  Hospitalist made aware of patient and will admit for further work-up.   Veryl Speak, MD 10/27/20 [phone]

## 2020-10-27 NOTE — Progress Notes (Signed)
Initial Nutrition Assessment  DOCUMENTATION CODES:   Obesity unspecified  INTERVENTION:  Pro[employer] Plus BID   Nutrition education   NUTRITION DIAGNOSIS:   Inadequate oral intake (prior to admission- due to vomiting abdominal pain) related to acute illness as evidenced by per patient/family report.  GOAL:  Patient will meet greater than or equal to 90% of their needs  MONITOR:  PO intake,Labs,Weight trends,Supplement acceptance  REASON FOR ASSESSMENT:   Malnutrition Screening Tool    ASSESSMENT: Patient is a 70 yo female with history of HTN, DM 2, Cirrhosis (not ETOH related), and breast cancer. She presented with c/o weakness, abdominal pain and vomiting.  Meal intake 100% of breakfast this morning. Her lunch is here and needed assistance with tray set up and cutting up her protein. No vomiting today per patient. Usual intake 2 meals daily. Patient likes doritos, salsa and cheeze-its, hamburger and mac-n-cheese, cookies, chinese food and diet soft drinks. Not a breakfast eater but likes eggs and cereal with milk. She only likes vegetables in the summer. Encouarged a heart healthy / cho modified diet to be continued at home.    Medications: Nicoderm, Protonix, Lasix  Labs reviewed: Sodium 125 (L), Potassium 3.3 (L), Glucose 40 (L), Albumin 1.5 (L), hgb. 10.8 (L).   Weight 96.2 kg - usual range of 96-100 kg the past 18+ months.  NUTRITION - FOCUSED PHYSICAL EXAM:  Flowsheet Row Most Recent Value  Orbital Region No depletion  Upper Arm Region No depletion  Thoracic and Lumbar Region No depletion  Buccal Region No depletion  Temple Region Mild depletion  Clavicle Bone Region Mild depletion  Clavicle and Acromion Bone Region No depletion  Scapular Bone Region Unable to assess  Dorsal Hand No depletion  Patellar Region No depletion  Anterior Thigh Region No depletion  Posterior Calf Region No depletion  Edema (RD Assessment) Moderate  Hair Reviewed  Eyes Reviewed   Mouth Reviewed  [missing broken teeth]  Skin Reviewed  Nails Reviewed     Diet Order:   Diet Order            Diet heart healthy/carb modified Room service appropriate? Yes; Fluid consistency: Thin  Diet effective now                 EDUCATION NEEDS:  Education needs have been addressed  Skin:  Skin Assessment: Reviewed RN Assessment (jaundice)  Last BM:  3/31  Height:   Ht Readings from Last 1 Encounters:  10/26/20 _0  (1.727 m)    Weight:   Wt Readings from Last 1 Encounters:  10/26/20 96.2 kg    Ideal Body Weight:   70 kg  BMI:  Body mass index is 32.23 kg/m.  Estimated Nutritional Needs:   Kcal:  8341-9622  Protein:  100-110 gr  Fluid:  1.5-1.7 liters daily   Colman Cater MS,RD,CSG,LDN Pager: Shea Evans

## 2020-10-27 NOTE — Progress Notes (Signed)
Patient seen and examined.  Admitted after midnight 2/2 jaundice, decreased appetite and worsening LFTs.  Patient currently afebrile, denies chest pain, no nausea, no vomiting and expressing no significant abdominal discomfort currently.  No further episode of diarrhea reported.  Was able to tolerate diet.  Please refer to H&P written by Dr. Clearence Ped for further info/details.  Plan: -Continue Lasix and Aldactone Continue to follow electrolytes and replete as needed. -Follow results of CT liver protocol -Follow results CT liver protocol CBC, BMP and liver function testing  -Follow GI service recommendation. -Continue daily PPI.  Barton Dubois MD [phone]

## 2020-10-27 NOTE — H&P (Signed)
TRH H&P    Patient Demographics:    Taylor Williams, is a 70 y.o. female  MRN: [id number]  DOB - Dec 22, 1950  Admit Date - 10/26/2020  Referring MD/NP/PA: Stark Jock  Outpatient Primary MD for the patient is [name], MD  Patient coming from: Home  Chief complaint- abdominal pain and vomiting   HPI:    Taylor Williams  is a 70 y.o. 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  Patient reports that she has been on a hiatal hernia protocol, but 3 weeks ago she started having abdominal pain and vomiting that came on gradually and was getting progressively worse.  The abdominal pain feels like achy pain in her epigastrium and periumbilical region.  Does not radiate, its intermittent.  Nothing identified that makes it worse or better.  Decreased appetite associated.  She reports she started having diarrhea as well.  Patient denies any hematemesis or hematochezia or melena.  She reports because she was not feeling well she stopped taking all of her medications.  Patient reports that she had not really noticed that she was jaundiced.  Sister-in-law at bedside reports that she noticed the patient was jaundice on the 27th.  Patient does report that she has had a decrease in urine output but attributes that to decrease in p.o. intake.  She has been unsteady on her feet, with 2 falls in the last week, and fatigue.  She reports yesterday she was very somnolent and had a hard time getting up.  She has had generalized weakness as well.  One of her falls was 3 days ago included hitting her head.  She has had no changes in vision or hearing since then.  She has had no focal deficits.  Patient does report that her head still feels  bruised.  Patient smokes a pack per day.  She does not drink alcohol.  She does not use illicit drugs.  She has been vaccinated for COVID.  Patient is DNR  In the ED Temp 97.9, heart rate 72, respiratory rate 17, blood pressure 130/54, satting at 98% White blood cell count 8.1, hemoglobin 12.0 Hyponatremic with a sodium of 124, hypokalemic with of potassium 3.2 Borderline hypoglycemic with a glucose of 73 UA is borderline urine culture pending Alk phos 201, albumin 1.8, lipase 47, bilirubin 8.9-up from 1 in July 2021 Patient did have an AFP recently but not during this visit that was 219 CT abdomen pelvis shows cirrhosis, portal vein hypertension, upper abdominal varices, new lesion within the lateral aspect of segment 3 of the left lobe, previous lesions mentioned are not seen again, nonemergent multiphase liver CT recommended, small volume ascites, mild infectious colitis cannot be excluded -given that patient has no white blood cell count, no fevers, colitis is thought to be lower on the differential Admission requested for inpatient consult with GI     Review  of systems:    In addition to the HPI above,  No Fever-chills, Endorses headache, No changes with Vision or hearing, No problems swallowing food or Liquids, No Chest pain, Cough or Shortness of Breath, Endorses abdominal pain, nausea, vomiting, diarrhea No Blood in stool or Urine, No dysuria, No new skin rashes or bruises, No new joints pains-aches,  No new asymmetric weakness, tingling, numbness in any extremity, admits to generalized weakness No recent weight gain or loss, No polyuria, polydypsia or polyphagia, No significant Mental Stressors.  All other systems reviewed and are negative.    Past History of the following :    Past Medical History:  Diagnosis Date  . Anemia   . Anxiety   . Breast cancer ([place])   . Cirrhosis of liver not due to alcohol ([place])   . Complication of anesthesia    Recall during  general anesthesia/ankle surgery/ 'light weight'  very sensitive to narcotics  . Depression   . Diabetes mellitus without complication ([place])   . High cholesterol   . Hypertension       Past Surgical History:  Procedure Laterality Date  . BIOPSY  04/19/2019   Procedure: BIOPSY;  Surgeon: Rogene Houston, MD;  [facility]: [facility];  Service: Endoscopy;;  esophageal  . BREAST LUMPECTOMY Left    states for ductal carcinoma, had surgery in late may  . COLONOSCOPY    . ENDOVENOUS ABLATION SAPHENOUS VEIN W/ LASER Right 04/15/2018   endovenous laser ablation R GSV by Ruta Hinds MD   . ENDOVENOUS ABLATION SAPHENOUS VEIN W/ LASER Left 05/13/2018   endovenous laser ablation left greater saphenous vein by Ruta Hinds MD   . ESOPHAGOGASTRODUODENOSCOPY (EGD) WITH PROPOFOL N/A 04/19/2019   Procedure: ESOPHAGOGASTRODUODENOSCOPY (EGD) WITH PROPOFOL;  Surgeon: Rogene Houston, MD;  Location: [facility];  Service: Endoscopy;  Laterality: N/A;  7:30  . galbladder    . GANGLION CYST EXCISION    . TONSILLECTOMY AND ADENOIDECTOMY        Social History:      Social History   Tobacco Use  . Smoking status: Current Every Day Smoker    Packs/day: 1.00    Years: 40.00    Pack years: 40.00    Start date: 10/28/1965  . Smokeless tobacco: Never Used  Substance Use Topics  . Alcohol use: No    Comment: 2 - 3 mixed drink yearly       Family History :     Family History  Problem Relation Age of Onset  . Cancer - Other Mother   . Hypertension Father   . Diabetes Father   . Dementia Father   . Hyperthyroidism Brother   . Cancer Paternal Grandmother   . Cancer Paternal Grandfather       Home Medications:   Prior to Admission medications   Medication Sig Start Date End Date Taking? Authorizing Provider  ALPRAZolam (XANAX) 0.25 MG tablet Take 0.25 mg by mouth 2 (two) times daily.    [provider]  cetirizine (ZYRTEC) 10 MG tablet Take 10 mg by mouth at bedtime.      [provider]  citalopram (CELEXA) 20 MG tablet Take 20 mg by mouth daily.    [provider]  dapagliflozin propanediol (FARXIGA) 5 MG TABS tablet Take 5 mg by mouth daily.    [provider]  doxycycline (MONODOX) 50 MG capsule Take 50 mg by mouth daily.    [provider]  ergocalciferol (VITAMIN D2) 1.25  MG (50000 UT) capsule Take 1 capsule (50,000 Units total) by mouth once a week. Patient not taking: Reported on 03/07/2020 02/16/20   Francene Finders L, NP-C  furosemide (LASIX) 20 MG tablet Take 1 tablet (20 mg total) by mouth daily. 09/08/19   Laurine Blazer B, PA-C  glipiZIDE (GLUCOTROL) 10 MG tablet Take 10 mg by mouth 2 (two) times daily before a meal.    [provider]  hydrocortisone 2.5 % cream Apply 1 application topically at bedtime.     [provider]  insulin glargine (LANTUS) 100 UNIT/ML injection Inject 45 Units into the skin at bedtime.     [provider]  ketoconazole (NIZORAL) 2 % cream Apply 1 application topically daily.     [provider]  Javier Docker Oil 500 MG CAPS Take 1,500 mg by mouth 2 (two) times daily.    [provider]  lisinopril (PRINIVIL,ZESTRIL) 5 MG tablet Take 5 mg by mouth daily.    [provider]  loperamide (IMODIUM A-D) 2 MG tablet Take 2-4 mg by mouth 4 (four) times daily as needed for diarrhea or loose stools.  Patient not taking: Reported on 02/02/2020    [provider]  metFORMIN (GLUCOPHAGE) 1000 MG tablet Take 1,000 mg by mouth 2 (two) times daily with a meal.    [provider]  potassium chloride SA (K-DUR,KLOR-CON) 20 MEQ tablet Take 1 tablet (20 mEq total) by mouth daily. 05/31/16   Penland, Kelby Fam, MD  sitaGLIPtin (JANUVIA) 100 MG tablet Take 100 mg by mouth daily.    [provider]  spironolactone (ALDACTONE) 25 MG tablet Take 1 tablet (25 mg total) by mouth daily. 09/08/19   Ezzard Standing, PA-C  venlafaxine XR (EFFEXOR-XR)  75 MG 24 hr capsule Take 75 mg by mouth 2 (two) times daily.    [provider]     Allergies:     Allergies  Allergen Reactions  . Aspirin Swelling    Facial swelling and panic attack when she took pain medication that contained aspirin  . Other Swelling    novacaine - patient states reaction happened as a child and she believes it caused facial swelling, mother always told her the MD said never take again  . Fish Allergy Other (See Comments)    gagging  . Procaine      Physical Exam:   Vitals  Blood pressure (!) 118/52, pulse 78, temperature 97.6 F (36.4 C), temperature source Oral, resp. rate (!) 24, height _0  (1.727 m), weight 96.2 kg, SpO2 99 %.  1.  General: Patient lying supine in bed no acute distress  2. Psychiatric: Mood and behavior normal for situation, pleasant, cooperative with exam  3. Neurologic: Face is symmetric, moves all 4 extremities voluntarily, equal sensation in the upper and lower extremities bilaterally, speech and language normal, alert oriented x3  4. HEENMT:  Head is atraumatic, normocephalic, pupils reactive to light, neck is supple, trachea is midline, mucous membranes are moist  5. Respiratory : Lungs are clear to auscultation bilaterally without wheezes, rhonchi, crackles, no clubbing, no cyanosis  6. Cardiovascular : Heart rate is normal, rhythm is regular, no murmurs rubs or gallops, peripheral edema present to the knees  7. Gastrointestinal:  Abdomen is soft, nondistended, nontender to palpation, bowel sounds active  8. Skin:  Skin is jaundiced, erythema of both great toes, on top of both great toes, no other acute lesions  9.Musculoskeletal:  Toenails removed from both great toes, no  other acute deformities, no calf tenderness    Data Review:    CBC Recent Labs  Lab 10/26/20 2056  WBC 8.1  HGB 12.0  HCT 33.9*  PLT 159  MCV 85.0  MCH 30.1  MCHC 35.4  RDW 21.1*  LYMPHSABS 1.2  MONOABS 0.8  EOSABS 0.2   BASOSABS 0.1   ------------------------------------------------------------------------------------------------------------------  Results for orders placed or performed during the hospital encounter of 10/26/20 (from the past 48 hour(s))  CBC with Differential/Platelet     Status: Abnormal   Collection Time: 10/26/20  8:56 PM  Result Value Ref Range   WBC 8.1 4.0 - 10.5 K/uL   RBC 3.99 3.87 - 5.11 MIL/uL   Hemoglobin 12.0 12.0 - 15.0 g/dL   HCT 33.9 (L) 36.0 - 46.0 %   MCV 85.0 80.0 - 100.0 fL   MCH 30.1 26.0 - 34.0 pg   MCHC 35.4 30.0 - 36.0 g/dL   RDW 21.1 (H) 11.5 - 15.5 %   Platelets 159 150 - 400 K/uL   nRBC 0.0 0.0 - 0.2 %   Neutrophils Relative % 70 %   Neutro Abs 5.7 1.7 - 7.7 K/uL   Lymphocytes Relative 15 %   Lymphs Abs 1.2 0.7 - 4.0 K/uL   Monocytes Relative 10 %   Monocytes Absolute 0.8 0.1 - 1.0 K/uL   Eosinophils Relative 3 %   Eosinophils Absolute 0.2 0.0 - 0.5 K/uL   Basophils Relative 1 %   Basophils Absolute 0.1 0.0 - 0.1 K/uL   Immature Granulocytes 1 %   Abs Immature Granulocytes 0.10 (H) 0.00 - 0.07 K/uL    Comment: Performed at [facility], [address], [place], [place] [address]  Comprehensive metabolic panel     Status: Abnormal   Collection Time: 10/26/20  8:56 PM  Result Value Ref Range   Sodium 124 (L) 135 - 145 mmol/L   Potassium 3.2 (L) 3.5 - 5.1 mmol/L   Chloride 92 (L) 98 - 111 mmol/L   CO2 22 22 - 32 mmol/L   Glucose, Bld 73 70 - 99 mg/dL    Comment: Glucose reference range applies only to samples taken after fasting for at least 8 hours.   BUN 17 8 - 23 mg/dL   Creatinine, Ser 0.97 0.44 - 1.00 mg/dL   Calcium 8.8 (L) 8.9 - 10.3 mg/dL   Total Protein 6.9 6.5 - 8.1 g/dL   Albumin 1.8 (L) 3.5 - 5.0 g/dL   AST 136 (H) 15 - 41 U/L   ALT 78 (H) 0 - 44 U/L   Alkaline Phosphatase 201 (H) 38 - 126 U/L   Total Bilirubin 8.9 (H) 0.3 - 1.2 mg/dL   GFR, Estimated >60 >60 mL/min    Comment: (NOTE) Calculated using the CKD-EPI Creatinine  Equation (2021)    Anion gap 10 5 - 15    Comment: Performed at [facility], [address] [place], [place] [address]  Lipase, blood     Status: None   Collection Time: 10/26/20  8:56 PM  Result Value Ref Range   Lipase 47 11 - 51 U/L    Comment: Performed at [facility], [address], [place], [place] [address]  Urinalysis, Routine w reflex microscopic     Status: Abnormal   Collection Time: 10/26/20  9:00 PM  Result Value Ref Range   Color, Urine AMBER (A) YELLOW    Comment: BIOCHEMICALS MAY BE AFFECTED BY COLOR   APPearance CLEAR CLEAR   Specific Gravity,  Urine 1.014 1.005 - 1.030   pH 5.0 5.0 - 8.0   Glucose, UA NEGATIVE NEGATIVE mg/dL   Hgb urine dipstick SMALL (A) NEGATIVE   Bilirubin Urine SMALL (A) NEGATIVE   Ketones, ur NEGATIVE NEGATIVE mg/dL   Protein, ur NEGATIVE NEGATIVE mg/dL   Nitrite NEGATIVE NEGATIVE   Leukocytes,Ua NEGATIVE NEGATIVE   RBC / HPF 0-5 0 - 5 RBC/hpf   WBC, UA 6-10 0 - 5 WBC/hpf   Bacteria, UA NONE SEEN NONE SEEN   Squamous Epithelial / LPF 6-10 0 - 5   Mucus PRESENT    Hyaline Casts, UA PRESENT     Comment: Performed at [facility], [address], [place],  [address]    Chemistries  Recent Labs  Lab 10/26/20 2056  NA 124*  K 3.2*  CL 92*  CO2 22  GLUCOSE 73  BUN 17  CREATININE 0.97  CALCIUM 8.8*  AST 136*  ALT 78*  ALKPHOS 201*  BILITOT 8.9*   ------------------------------------------------------------------------------------------------------------------  ------------------------------------------------------------------------------------------------------------------ GFR: Estimated Creatinine Clearance: 65.4 mL/min (by C-G formula based on SCr of 0.97 mg/dL). Liver Function Tests: Recent Labs  Lab 10/26/20 2056  AST 136*  ALT 78*  ALKPHOS 201*  BILITOT 8.9*  PROT 6.9  ALBUMIN 1.8*   Recent Labs  Lab 10/26/20 2056  LIPASE 47   No results for input(s): AMMONIA in the last 168 hours. Coagulation  Profile: No results for input(s): INR, PROTIME in the last 168 hours. Cardiac Enzymes: No results for input(s): CKTOTAL, CKMB, CKMBINDEX, TROPONINI in the last 168 hours. BNP (last 3 results) No results for input(s): PROBNP in the last 8760 hours. HbA1C: No results for input(s): HGBA1C in the last 72 hours. CBG: No results for input(s): GLUCAP in the last 168 hours. Lipid Profile: No results for input(s): CHOL, HDL, LDLCALC, TRIG, CHOLHDL, LDLDIRECT in the last 72 hours. Thyroid Function Tests: No results for input(s): TSH, T4TOTAL, FREET4, T3FREE, THYROIDAB in the last 72 hours. Anemia Panel: No results for input(s): VITAMINB12, FOLATE, FERRITIN, TIBC, IRON, RETICCTPCT in the last 72 hours.  --------------------------------------------------------------------------------------------------------------- Urine analysis:    Component Value Date/Time   COLORURINE AMBER (A) 10/26/2020 2100   APPEARANCEUR CLEAR 10/26/2020 2100   LABSPEC 1.014 10/26/2020 2100   PHURINE 5.0 10/26/2020 2100   GLUCOSEU NEGATIVE 10/26/2020 2100   HGBUR SMALL (A) 10/26/2020 2100   BILIRUBINUR SMALL (A) 10/26/2020 2100   KETONESUR NEGATIVE 10/26/2020 2100   PROTEINUR NEGATIVE 10/26/2020 2100   NITRITE NEGATIVE 10/26/2020 2100   LEUKOCYTESUR NEGATIVE 10/26/2020 2100      Imaging Results:    CT ABDOMEN PELVIS W CONTRAST  Result Date: 10/26/2020 CLINICAL DATA:  Dehydration, jaundice, weakness, fatigue, history of cirrhosis and breast cancer EXAM: CT ABDOMEN AND PELVIS WITH CONTRAST TECHNIQUE: Multidetector CT imaging of the abdomen and pelvis was performed using the standard protocol following bolus administration of intravenous contrast. CONTRAST:  161m OMNIPAQUE IOHEXOL 300 MG/ML  SOLN COMPARISON:  07/04/2020, 01/28/2020 FINDINGS: Lower chest: Partially visualized right middle lobe nodule on image 1 measuring 8 mm, corresponding to calcified nodule on previous chest CT. Remaining portions of the lung bases  are clear. Hepatobiliary: Diffuse changes of cirrhosis are again noted. The left lobe liver lesion seen on previous MRI are not well visualized on this single phase CT. There is a new hypodense lesion within the lateral aspect left lobe liver segment 3, measuring 3.2 cm image 30/2. Given previous MRI findings, this is concerning for new hepatocellular carcinoma. As per previous recommendation,  nonemergent dedicated multiphase liver CT is recommended for further evaluation, if not performed in the interim. No intrahepatic duct dilation.  The gallbladder surgically absent. Pancreas: Unremarkable. No pancreatic ductal dilatation or surrounding inflammatory changes. Spleen: Borderline splenomegaly measuring 12.8 cm in craniocaudal length. No focal abnormalities. Adrenals/Urinary Tract: Punctate less than 2 mm nonobstructing left renal calculus. No right-sided calculi. No obstructive uropathy within either kidney. Bladder is grossly normal. The left adrenal is not well visualized. Nodular thickening of the right adrenal gland is unchanged, nonspecific. Stomach/Bowel: No bowel obstruction or ileus. Scattered colonic diverticulosis without diverticulitis. There is mild diffuse colonic wall thickening, nonspecific given abdominal ascites and hypoproteinemic state related to cirrhosis. Inflammatory or infectious colitis cannot be excluded. There is a normal appendix in the right lower quadrant. Vascular/Lymphatic: Portal vein, splenic vein, and SMV are patent. Sequela of portal venous hypertension is noted, with marked splenic and gastric varices. Diffuse atherosclerosis throughout the aorta and its branches. No pathologic adenopathy. Reproductive: Uterus and bilateral adnexa are unremarkable. Other: Small volume ascites throughout the abdomen and pelvis, greatest surrounding the liver. No free intraperitoneal gas. No abdominal wall hernia. There is mild diffuse body wall edema. Musculoskeletal: No acute or destructive bony  lesions. Reconstructed images demonstrate no additional findings. IMPRESSION: 1. Cirrhosis, with portal venous hypertension manifested by extensive upper abdominal varices and borderline splenomegaly. 2. New lesion within the lateral aspect of segment 3 left lobe liver, concerning for hepatocellular carcinoma. The remaining liver lesions seen within the left lobe on prior MRI are not well visualized on this single phase CT exam. Per previous MRI recommendation, nonemergent multiphase liver CT is again recommended if not performed in the interim. 3. Small volume ascites. 4. Mild diffuse colonic wall thickening, nonspecific given underlying liver disease and ascites. Mild inflammatory or infectious colitis cannot be excluded. 5.  Aortic Atherosclerosis (ICD10-I70.0). Electronically Signed   By: Randa Ngo M.D.   On: 10/26/2020 23:54       Assessment & Plan:    Principal Problem:   Jaundice Active Problems:   Hyponatremia   Hypokalemia   Transaminitis   Severe protein-calorie malnutrition (HCC)   DMII (diabetes mellitus, type 2) (HCC)   Tobacco abuse   1. Jaundice 1. Bili 9 2. History of non alcoholic cirrhosis 3. CT abdomen indicative of possible hepatocellular carcinoma 4. GI consult 5. 1L NaCl in ED 6. Trend in the AM 7. Continue to monitor 2. Hyponatremia/Hypokalemia 1. 1 L NaCl in ED 2. Replace K+ with IV runs 3. Trend in the AM 3. Transaminitis 1. Up from 69/49 to 136/78 2. 2/2 to new hepatic lesion?  3. 2/2 to portal vein HTN? - continue aldactone 4. Gastro consulted 5. Trend in the AM 4. DMII 1. Glucose well controlled now at 73 2. Continue to monitor 5. Tobacco abuse 1. Counseled on cessation 2. Nicotine patch 6. BL great toe wounds 1. Self inflicted removal of great toe nails 2. No leukocytosis 3. No fevers 4. Xrays to r/o underlying pathology 7.    DVT Prophylaxis-   heparin - SCDs   AM Labs Ordered, also please review Full Orders  Family  Communication: Admission, patients condition and plan of care including tests being ordered have been discussed with the patient and sister in law who indicate understanding and agree with the plan and Code Status.  Code Status:  DNR  Admission status: Inpatient :The appropriate admission status for this patient is INPATIENT. Inpatient status is judged to be reasonable and necessary in order  to provide the required intensity of service to ensure the patient's safety. The patient's presenting symptoms, physical exam findings, and initial radiographic and laboratory data in the context of their chronic comorbidities is felt to place them at high risk for further clinical deterioration. Furthermore, it is not anticipated that the patient will be medically stable for discharge from the hospital within 2 midnights of admission. The following factors support the admission status of inpatient.     The patient's presenting symptoms include abdominal pain and emesis The worrisome physical exam findings include jaundice The initial radiographic and laboratory data are worrisome because of suspected hepatocellular carcinoma The chronic co-morbidities include DMII       * I certify that at the point of admission it is my clinical judgment that the patient will require inpatient hospital care spanning beyond 2 midnights from the point of admission due to high intensity of service, high risk for further deterioration and high frequency of surveillance required.*  Time spent in minutes : [place]

## 2020-10-28 DIAGNOSIS — K7581 Nonalcoholic steatohepatitis (NASH): Secondary | ICD-10-CM

## 2020-10-28 DIAGNOSIS — R197 Diarrhea, unspecified: Secondary | ICD-10-CM

## 2020-10-28 DIAGNOSIS — R7401 Elevation of levels of liver transaminase levels: Secondary | ICD-10-CM

## 2020-10-28 DIAGNOSIS — C220 Liver cell carcinoma: Principal | ICD-10-CM

## 2020-10-28 DIAGNOSIS — K7460 Unspecified cirrhosis of liver: Secondary | ICD-10-CM

## 2020-10-28 DIAGNOSIS — Z720 Tobacco use: Secondary | ICD-10-CM

## 2020-10-28 NOTE — Progress Notes (Signed)
Unable to collect stool sample for C-diff test due to patient's last stool was formed.

## 2020-10-28 NOTE — Progress Notes (Signed)
Taylor Williams, M.D. Gastroenterology & Hepatology   Interval History:  Patient reports feeling better although she had one episode of vomiting in the AM. Had a formed stool yesterday so it was not processed. Deneis any fever or chills, still has abdominal pain in her RUQ. Liver CT performed yesterday showed presence of one 4.9x3.7 cm lesion on in L hepatic lobe with presence of washout. Also presence of apparent new occlusion of intrahepatic portal vein suspicious for tumor thrombus. There is also a second lesion of 2.6 cm in the lateral segment of the left lobe (unclear if a mass or intrahepatic ductal dilation), also presence of adenopathy in porta hepatis and retroperitoneum.Also presence of diffuse colitis. Mild ascites Labs showed mild decrease in Tbili to 7.1, as well as AST 117 ALT 66 and alk phos 167.  Inpatient Medications:  Current Facility-Administered Medications:  .  (feeding supplement) PROSource Plus liquid 30 mL, 30 mL, Oral, BID BM, Barton Dubois, MD, 30 mL at 10/28/20 0824 .  ALPRAZolam Duanne Moron) tablet 0.25 mg, 0.25 mg, Oral, BID, Zierle-Ghosh, Asia B, DO, 0.25 mg at 10/28/20 5809 .  Chlorhexidine Gluconate Cloth 2 % PADS 6 each, 6 each, Topical, Daily, Barton Dubois, MD, 6 each at 10/28/20 [phone] .  citalopram (CELEXA) tablet 20 mg, 20 mg, Oral, Daily, Zierle-Ghosh, Asia B, DO, 20 mg at 10/28/20 0824 .  feeding supplement (ENSURE ENLIVE / ENSURE PLUS) liquid 237 mL, 237 mL, Oral, BID BM, Zierle-Ghosh, Asia B, DO, 237 mL at 10/28/20 0823 .  furosemide (LASIX) tablet 20 mg, 20 mg, Oral, Daily, Zierle-Ghosh, Asia B, DO, 20 mg at 10/28/20 0824 .  heparin injection 5,000 Units, 5,000 Units, Subcutaneous, Q8H, Zierle-Ghosh, Asia B, DO, 5,000 Units at 10/28/20 8250 .  ibuprofen (ADVIL) tablet 400 mg, 400 mg, Oral, Q6H PRN, Zierle-Ghosh, Asia B, DO .  lisinopril (ZESTRIL) tablet 5 mg, 5 mg, Oral, Daily, Zierle-Ghosh, Asia B, DO, 5 mg at 10/28/20 0824 .  nicotine (NICODERM CQ - dosed  in mg/24 hours) patch 21 mg, 21 mg, Transdermal, Daily, Zierle-Ghosh, Asia B, DO, 21 mg at 10/28/20 0824 .  ondansetron (ZOFRAN) tablet 4 mg, 4 mg, Oral, Q6H PRN **OR** ondansetron (ZOFRAN) injection 4 mg, 4 mg, Intravenous, Q6H PRN, Zierle-Ghosh, Asia B, DO .  oxyCODONE (Oxy IR/ROXICODONE) immediate release tablet 5 mg, 5 mg, Oral, Q4H PRN, Zierle-Ghosh, Asia B, DO .  pantoprazole (PROTONIX) EC tablet 40 mg, 40 mg, Oral, QAC breakfast, Aliene Altes S, PA-C, 40 mg at 10/28/20 5397 .  spironolactone (ALDACTONE) tablet 25 mg, 25 mg, Oral, Daily, Zierle-Ghosh, Asia B, DO, 25 mg at 10/28/20 0823 .  venlafaxine XR (EFFEXOR-XR) 24 hr capsule 75 mg, 75 mg, Oral, BID, Zierle-Ghosh, Asia B, DO, 75 mg at 10/28/20 6734   I/O    Intake/Output Summary (Last 24 hours) at 10/28/2020 0828 Last data filed at 10/27/2020 1730 Gross per 24 hour  Intake 1200 ml  Output --  Net 1200 ml     Physical Exam: Temp:  [97.3 F (36.3 C)-98.1 F (36.7 C)] 97.8 F (36.6 C) (04/02 0348) Pulse Rate:  [67-83] 83 (04/02 0348) Resp:  [20] 20 (04/02 0348) BP: (101-118)/(44-51) 107/50 (04/02 0348) SpO2:  [51 %-100 %] 97 % (04/02 0348)  Temp (24hrs), Avg:97.7 F (36.5 C), Min:97.3 F (36.3 C), Max:98.1 F (36.7 C)  GENERAL: The patient is AO x3, in no acute distress. HEENT: Head is normocephalic and atraumatic. EOMI are intact. Mouth is well hydrated and without lesions. NECK: Supple. No masses  LUNGS: Clear to auscultation. No presence of rhonchi/wheezing/rales. Adequate chest expansion HEART: RRR, normal s1 and s2. ABDOMEN: mildly tender to palpation in RUQ, no guarding, no peritoneal signs, and nondistended. BS +. No masses. EXTREMITIES: Without any cyanosis, clubbing, rash, lesions or edema. NEUROLOGIC: AOx3, no focal motor deficit. SKIN: has generalized jaundice, no rashes  Laboratory Data: CBC:     Component Value Date/Time   WBC 5.6 10/27/2020 0414   RBC 3.67 (L) 10/27/2020 0414   HGB 10.8 (L) 10/27/2020  0414   HCT 30.7 (L) 10/27/2020 0414   PLT 143 (L) 10/27/2020 0414   MCV 83.7 10/27/2020 0414   MCH 29.4 10/27/2020 0414   MCHC 35.2 10/27/2020 0414   RDW 20.7 (H) 10/27/2020 0414   LYMPHSABS 1.2 10/27/2020 0414   MONOABS 0.8 10/27/2020 0414   EOSABS 0.2 10/27/2020 0414   BASOSABS 0.0 10/27/2020 0414   COAG:  Lab Results  Component Value Date   INR 1.6 (H) 10/27/2020   INR 1.0 03/07/2020   INR 1.1 09/08/2019    BMP:  BMP Latest Ref Rng & Units 10/27/2020 10/26/2020 01/28/2020  Glucose 70 - 99 mg/dL 40(LL) 73 331(H)  BUN 8 - 23 mg/dL 15 17 9   Creatinine 0.44 - 1.00 mg/dL 0.71 0.97 0.57  Sodium 135 - 145 mmol/L 125(L) 124(L) 134(L)  Potassium 3.5 - 5.1 mmol/L 3.3(L) 3.2(L) 4.5  Chloride 98 - 111 mmol/L 95(L) 92(L) 100  CO2 22 - 32 mmol/L 21(L) 22 26  Calcium 8.9 - 10.3 mg/dL 8.3(L) 8.8(L) 9.6    HEPATIC:  Hepatic Function Latest Ref Rng & Units 10/27/2020 10/27/2020 10/26/2020  Total Protein 6.5 - 8.1 g/dL - 5.9(L) 6.9  Albumin 3.5 - 5.0 g/dL - 1.5(L) 1.8(L)  AST 15 - 41 U/L - 117(H) 136(H)  ALT 0 - 44 U/L - 66(H) 78(H)  Alk Phosphatase 38 - 126 U/L - 167(H) 201(H)  Total Bilirubin 0.3 - 1.2 mg/dL 7.7(H) 7.1(H) 8.9(H)  Bilirubin, Direct 0.0 - 0.2 mg/dL 3.8(H) - -    CARDIAC: No results found for: CKTOTAL, CKMB, CKMBINDEX, TROPONINI    Imaging: I personally reviewed and interpreted the available labs, imaging and endoscopic files.   Assessment/Plan: This is a 70 year old female with past medical history of NASH cirrhosis with recent decompensation due to HCC,GERD, history of breast cancer, diabetes, hyperlipidemia, hypertension, depression, anxiety and history of H. pylori infection, who came to the hospital after she presented recurrent episodes of vomiting, nausea and upper abdominal pain along with watery diarrhea. Patient was also found to have increasing bilirubin on admission. MELD Na today was 27. In terms for her initial symptoms, even though she could present these  alterations as part of her [facility], will need to rule out ifnectious causes given the recent exposure to antibiotics - still watiign to process GI path and c. Diff testing.  She will benefit from eating smaller portions and using Zofran as needed for nausea.   Regarding her recent cirrhosis decompensation, this was likely triggered by [facility], as she had a negative CXR. Will need to obtain a urine sample but this seems less likely. He MELD Na today was. Unfortunately her CT liver was confirmatory for a 4.9 cm HCC which possibly has invaded the portal vein. Due to this, she is not a candidate for OLT or locoregional therapy, which I thoroughly discussed with the patient. She may benefit from evaluation by oncology to determine her candidacy for Atezolizumab plus Bevacizumab vs sorafenib vs levatinib.. Will request Doppler of  the livre to confirm portal tumor thrombosis.   # Subacute nausea and diarrhea # HCC, possibly invading portal vein #  Decompensated alcoholic cirrhosis - Daily MELD labs - Obtain liver Doppler - Oncology evaluation, could be held as outpatient - Follow up GI path and C. Diff in stool - Zofran as needed for nausea  Taylor Peppers, MD Gastroenterology and Hepatology [facility]

## 2020-10-28 NOTE — Progress Notes (Signed)
PROGRESS NOTE    Taylor Williams  XBD:[id number] DOB: [birth year] DOA: 10/26/2020 PCP: [name], MD   Chief Complaint  Patient presents with  . Dehydration    Brief admission narrative:  As per H&P written by Dr. Clearence Ped on 10/27/20  Taylor Williams  is a 70 y.o. 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  Patient reports that she has been on a hiatal hernia protocol, but 3 weeks ago she started having abdominal pain and vomiting that came on gradually and was getting progressively worse.  The abdominal pain feels like achy pain in her epigastrium and periumbilical region.  Does not radiate, its intermittent.  Nothing identified that makes it worse or better.  Decreased appetite associated.  She reports she started having diarrhea as well.  Patient denies any hematemesis or hematochezia or melena.  She reports because she was not feeling well she stopped taking all of her medications.  Patient reports that she had not really noticed that she was jaundiced.  Sister-in-law at bedside reports that she noticed the patient was jaundice on the 27th.  Patient does report that she has had a decrease in urine output but attributes that to decrease in p.o. intake.  She has been unsteady on her feet, with 2 falls in the last week, and fatigue.  She reports yesterday she was very somnolent and had a hard time getting up.  She has had generalized weakness as well.  One of her falls was 3 days ago included hitting her head.  She has had no changes in vision or hearing since then.  She has had no focal deficits.  Patient does report that her head still feels bruised.  Patient smokes a pack per day.  She does not drink alcohol.  She does not use illicit drugs.  She has been vaccinated for COVID.   Patient is DNR  In the ED Temp 97.9, heart rate 72, respiratory rate 17, blood pressure 130/54, satting at 98% White blood cell count 8.1, hemoglobin 12.0 Hyponatremic with a sodium of 124, hypokalemic with of potassium 3.2 Borderline hypoglycemic with a glucose of 73 UA is borderline urine culture pending Alk phos 201, albumin 1.8, lipase 47, bilirubin 8.9-up from 1 in July 2021 Patient did have an AFP recently but not during this visit that was 219 CT abdomen pelvis shows cirrhosis, portal vein hypertension, upper abdominal varices, new lesion within the lateral aspect of segment 3 of the left lobe, previous lesions mentioned are not seen again, nonemergent multiphase liver CT recommended, small volume ascites, mild infectious colitis cannot be excluded -given that patient has no white blood cell count, no fevers, colitis is thought to be lower on the differential Admission requested for inpatient consult with GI  Assessment & Plan: 1-hyponatremia/hypokalemia -In the setting of dehydration, poor oral intake and cirrhosis -Continue gentle hydration and follow electrolytes -Continue slowly advancing diet and assess tolerance -Continue spironolactone -Repeat complete metabolic panel in the morning and follow clinical response.  2-jaundice and abdominal pain -In the setting of hepatocellular carcinoma possibly invading portal vein -Following recommendations by gastroenterology service will get liver Doppler -Daily meld score labs -Continue as needed antiemetics outpatient follow-up with oncology service; in order to determine candidacy for treatment. -guarded prognosis  3-transaminitis -Continue to follow LFTs -In the setting of portal  hypertension, hepatocellular carcinoma and underlying decompensated cirrhosis.  4-decompensated cirrhosis -Continue minimizing hepatotoxic agents -Follow GI service recommendation -Continue Bactrim.  5-type 2 diabetes mellitus -Continue to follow  CBGs -Patient no eating much -Will use sliding scale insulin if blood sugar above 200.  6-tobacco abuse -Cessation counseling provided -Continue nicotine patch  7-DNR -Patient expressed looking to treat the treatable but no wanting to pursue that invasive or artificial management. -Wishes will be respected -Follow clinical response.  DVT prophylaxis: heparin Code Status: DNR Family Communication: No family at bedside.  Patient expressed that she will contact her family members  And provide updates.  Disposition:   Status is: Inpatient  Dispo: The patient is from: Home              Anticipated d/c is to: Home              Patient currently not medically stable for discharge; still experiencing intermittent nausea, vomiting and abdominal discomfort.   Difficult to place patient no       Consultants:   GI   Procedures:  See below for x-ray reports.  Antimicrobials:  None   Subjective: Patient reports intermittent nausea and one episode of vomiting.  Still with positive icterus and jaundice.  Intermittent abdominal pain reported.  Objective: Vitals:   10/28/20 0011 10/28/20 0348 10/28/20 1433 10/28/20 2028  BP:  (!) 107/50 (!) 110/48 (!) 112/51  Pulse: 76 83 79 71  Resp:  20  19  Temp:  97.8 F (36.6 C) (!) 97.4 F (36.3 C) 97.7 F (36.5 C)  TempSrc:   Oral   SpO2: 92% 97% 100% 100%  Weight:      Height:       No intake or output data in the 24 hours ending 10/28/20 2232 Filed Weights   10/26/20 1918  Weight: 96.2 kg    Examination:  General exam: Appears calm and comfortable; reports intermittent nausea and one episode of vomiting this morning.  No chest pain, no shortness of breath.  Positive icterus and jaundice. Reports abd pain. Respiratory system: Clear to auscultation. Respiratory effort normal. No using accessory muscles, good O2 sat on RA. Cardiovascular system: S1 & S2 heard, RRR. No JVD, murmurs, rubs, gallops or clicks. No pedal  edema. Gastrointestinal system: Abdomen is mildly distended, and with mild RUQ pain. No guarding, positive BS. Central nervous system: Alert and oriented. No focal neurological deficits. Extremities: trace to 1 plus edema bilaterally. No cyanosis, no clubbing. Skin: No no petechiae. Psychiatry: Judgement and insight appear normal. Mood & affect appropriate.     Data Reviewed: I have personally reviewed following labs and imaging studies  CBC: Recent Labs  Lab 10/26/20 2056 10/27/20 0414  WBC 8.1 5.6  NEUTROABS 5.7 3.2  HGB 12.0 10.8*  HCT 33.9* 30.7*  MCV 85.0 83.7  PLT 159 143*    Basic Metabolic Panel: Recent Labs  Lab 10/26/20 2056 10/27/20 0414  NA 124* 125*  K 3.2* 3.3*  CL 92* 95*  CO2 22 21*  GLUCOSE 73 40*  BUN 17 15  CREATININE 0.97 0.71  CALCIUM 8.8* 8.3*  MG  --  1.7    GFR: Estimated Creatinine Clearance: 79.3 mL/min (by C-G formula based on SCr of 0.71 mg/dL).  Liver Function Tests: Recent Labs  Lab 10/26/20 2056 10/27/20 0414 10/27/20 1111  AST 136* 117*  --   ALT 78* 66*  --   ALKPHOS 201* 167*  --   BILITOT 8.9* 7.1* 7.7*  PROT 6.9 5.9*  --   ALBUMIN 1.8* 1.5*  --     CBG: Recent Labs  Lab 10/27/20 0729 10/27/20 0800  GLUCAP 52* 75     Recent Results (from the past 240 hour(s))  SARS CORONAVIRUS 2 (TAT 6-24 HRS) Nasopharyngeal Nasopharyngeal Swab     Status: None   Collection Time: 10/27/20 12:46 AM   Specimen: Nasopharyngeal Swab  Result Value Ref Range Status   SARS Coronavirus 2 NEGATIVE NEGATIVE Final    Comment: (NOTE) SARS-CoV-2 target nucleic acids are NOT DETECTED.  The SARS-CoV-2 RNA is generally detectable in upper and lower respiratory specimens during the acute phase of infection. Negative results do not preclude SARS-CoV-2 infection, do not rule out co-infections with other pathogens, and should not be used as the sole basis for treatment or other patient management decisions. Negative results must be  combined with clinical observations, patient history, and epidemiological information. The expected result is Negative.  Fact Sheet for Patients: SugarRoll.be  Fact Sheet for Healthcare Providers: https://www.woods-mathews.com/  This test is not yet approved or cleared by the [place] FDA and  has been authorized for detection and/or diagnosis of SARS-CoV-2 by FDA under an Emergency Use Authorization (EUA). This EUA will remain  in effect (meaning this test can be used) for the duration of the COVID-19 declaration under Se ction 564(b)(1) of the Act, 21 U.S.C. section 360bbb-3(b)(1), unless the authorization is terminated or revoked sooner.  Performed at [facility], [place] [address], [place], [place] [address]          Radiology Studies: CT ABDOMEN PELVIS W CONTRAST  Result Date: 10/26/2020 CLINICAL DATA:  Dehydration, jaundice, weakness, fatigue, history of cirrhosis and breast cancer EXAM: CT ABDOMEN AND PELVIS WITH CONTRAST TECHNIQUE: Multidetector CT imaging of the abdomen and pelvis was performed using the standard protocol following bolus administration of intravenous contrast. CONTRAST:  151m OMNIPAQUE IOHEXOL 300 MG/ML  SOLN COMPARISON:  07/04/2020, 01/28/2020 FINDINGS: Lower chest: Partially visualized right middle lobe nodule on image 1 measuring 8 mm, corresponding to calcified nodule on previous chest CT. Remaining portions of the lung bases are clear. Hepatobiliary: Diffuse changes of cirrhosis are again noted. The left lobe liver lesion seen on previous MRI are not well visualized on this single phase CT. There is a new hypodense lesion within the lateral aspect left lobe liver segment 3, measuring 3.2 cm image 30/2. Given previous MRI findings, this is concerning for new hepatocellular carcinoma. As per previous recommendation, nonemergent dedicated multiphase liver CT is recommended for further evaluation, if not  performed in the interim. No intrahepatic duct dilation.  The gallbladder surgically absent. Pancreas: Unremarkable. No pancreatic ductal dilatation or surrounding inflammatory changes. Spleen: Borderline splenomegaly measuring 12.8 cm in craniocaudal length. No focal abnormalities. Adrenals/Urinary Tract: Punctate less than 2 mm nonobstructing left renal calculus. No right-sided calculi. No obstructive uropathy within either kidney. Bladder is grossly normal. The left adrenal is not well visualized. Nodular thickening of the right adrenal gland is unchanged, nonspecific. Stomach/Bowel: No bowel obstruction or ileus. Scattered colonic diverticulosis without diverticulitis. There is mild diffuse colonic wall thickening, nonspecific given abdominal ascites and hypoproteinemic state related to cirrhosis. Inflammatory or infectious colitis cannot be excluded. There is a normal appendix in the right lower quadrant. Vascular/Lymphatic: Portal vein, splenic vein, and SMV are patent. Sequela of portal venous hypertension is noted, with marked splenic and gastric varices. Diffuse atherosclerosis throughout the aorta and its branches. No pathologic adenopathy. Reproductive: Uterus and bilateral adnexa  are unremarkable. Other: Small volume ascites throughout the abdomen and pelvis, greatest surrounding the liver. No free intraperitoneal gas. No abdominal wall hernia. There is mild diffuse body wall edema. Musculoskeletal: No acute or destructive bony lesions. Reconstructed images demonstrate no additional findings. IMPRESSION: 1. Cirrhosis, with portal venous hypertension manifested by extensive upper abdominal varices and borderline splenomegaly. 2. New lesion within the lateral aspect of segment 3 left lobe liver, concerning for hepatocellular carcinoma. The remaining liver lesions seen within the left lobe on prior MRI are not well visualized on this single phase CT exam. Per previous MRI recommendation, nonemergent  multiphase liver CT is again recommended if not performed in the interim. 3. Small volume ascites. 4. Mild diffuse colonic wall thickening, nonspecific given underlying liver disease and ascites. Mild inflammatory or infectious colitis cannot be excluded. 5.  Aortic Atherosclerosis (ICD10-I70.0). Electronically Signed   By: Randa Ngo M.D.   On: 10/26/2020 23:54   DG CHEST PORT 1 VIEW  Result Date: 10/27/2020 CLINICAL DATA:  70 year old female with history of cough. EXAM: PORTABLE CHEST - 1 VIEW COMPARISON:  Chest CT from 01/28/2020 FINDINGS: The mediastinal contours are within normal limits. No cardiomegaly. The lungs are clear bilaterally without evidence of focal consolidation, pleural effusion, or pneumothorax. The known pulmonary nodules visualized on comparison chest CT are not radiographically apparent. Surgical clips in the left axilla. No acute osseous abnormality. IMPRESSION: No acute cardiopulmonary process. Electronically Signed   By: Ruthann Cancer MD   On: 10/27/2020 16:12   DG Toe Great Left  Result Date: 10/27/2020 CLINICAL DATA:  Redness and swelling, initial encounter EXAM: LEFT GREAT TOE COMPARISON:  None. FINDINGS: There is no evidence of fracture or dislocation. There is no evidence of arthropathy or other focal bone abnormality. Soft tissues are unremarkable. IMPRESSION: No acute abnormality noted. Electronically Signed   By: Inez Catalina M.D.   On: 10/27/2020 02:48   DG Toe Great Right  Result Date: 10/27/2020 CLINICAL DATA:  Redness and swelling, initial encounter EXAM: RIGHT GREAT TOE COMPARISON:  None. FINDINGS: There is no evidence of fracture or dislocation. There is no evidence of arthropathy or other focal bone abnormality. Soft tissues are unremarkable. IMPRESSION: No acute abnormality noted. Electronically Signed   By: Inez Catalina M.D.   On: 10/27/2020 02:48   CT LIVER ABDOMEN W WO CONTRAST  Result Date: 10/27/2020 CLINICAL DATA:  Dehydration with jaundice, weakness  and fatigue. History of cirrhosis, hypertension, diabetes and breast cancer. EXAM: CT ABDOMEN WITHOUT AND WITH CONTRAST TECHNIQUE: Multidetector CT imaging of the abdomen was performed following the standard protocol before and following the bolus administration of intravenous contrast. CONTRAST:  117m OMNIPAQUE IOHEXOL 300 MG/ML  SOLN COMPARISON:  Abdominopelvic CT 10/26/2020. Abdominal MRI 07/04/2020. FINDINGS: Lower chest: Stable right middle lobe nodularity. Atherosclerosis of the aorta and coronary arteries. No significant pleural or pericardial effusion. Hepatobiliary: Morphologic changes of cirrhosis are again noted. No arterial phase enhancing lesions are identified within the liver. However, a low-density lesion posteriorly in the left hepatic lobe appears enlarged compared with the previous MRI, measuring up to 4.9 x 3.7 cm on image 40/11. This demonstrates contrast washout on the delayed images. As seen on recent CT, there is a low-density lesion more peripherally in the lateral segment of the left lobe, measuring up to 2.6 cm on image 57/6. This has a nonspecific appearance and could reflect intrahepatic ductal dilatation. There is apparent new occlusion of the intrahepatic portal vein, suspicious for tumor thrombus. There is grossly  stable extrahepatic biliary dilatation post cholecystectomy. No significant intrahepatic biliary dilatation. Pancreas: Unremarkable. No pancreatic ductal dilatation or surrounding inflammatory changes. Spleen: Stable mild splenomegaly without focal abnormality. Adrenals/Urinary Tract: The adrenal glands appear unchanged. No evidence of urinary tract calculus or hydronephrosis. Small bilateral renal cysts. Stomach/Bowel: Enteric contrast was administered and has passed into the colon. The stomach is decompressed. There is no significant small bowel distension or wall thickening. However, there is diffuse wall thickening of the visualized colon which appears progressive  compared with recent CT, suspicious for colitis. Vascular/Lymphatic: Grossly stable adenopathy in the porta hepatis and retroperitoneum. There are multiple venous collaterals in the left upper quadrant. As above, poor visualization of contrast in the portal vein, suspicious for portal vein tumor thrombus. Other: Small volume of ascites, increased from previous MRI. Musculoskeletal: No acute or significant osseous findings. Multilevel lumbar spondylosis. IMPRESSION: 1. Enlarging lesions in the left hepatic lobe without obvious arterial phase enhancement, but washout on the delayed images and probable portal vein tumor thrombus. In this patient with an elevated serum AFP level, these findings are worrisome for progressive multifocal hepatocellular carcinoma. 2. Progressive wall thickening throughout the visualized colon suspicious for colitis. This pattern can be seen with Clostridium difficile colitis. 3. Stable sequela of cirrhosis and portal hypertension with multiple venous collaterals in the left upper quadrant and mild ascites. 4. Grossly stable extrahepatic biliary dilatation post cholecystectomy. Electronically Signed   By: Richardean Sale M.D.   On: 10/27/2020 19:19        Scheduled Meds: . (feeding supplement) PROSource Plus  30 mL Oral BID BM  . ALPRAZolam  0.25 mg Oral BID  . Chlorhexidine Gluconate Cloth  6 each Topical Daily  . citalopram  20 mg Oral Daily  . feeding supplement  237 mL Oral BID BM  . furosemide  20 mg Oral Daily  . heparin  5,000 Units Subcutaneous Q8H  . lisinopril  5 mg Oral Daily  . nicotine  21 mg Transdermal Daily  . pantoprazole  40 mg Oral QAC breakfast  . spironolactone  25 mg Oral Daily  . venlafaxine XR  75 mg Oral BID   Continuous Infusions:   LOS: 1 day    Time spent: 30 minutes    Barton Dubois, MD [facility]   To contact the attending provider between 7A-7P or the covering provider during after hours 7P-7A, please log into the web  site [email or web address] and access using universal [place] password for that web site. If you do not have the password, please call the hospital operator.  10/28/2020, 10:32 PM

## 2020-10-29 LAB — COMPREHENSIVE METABOLIC PANEL
ALT: 69 U/L — ABNORMAL HIGH (ref 0–44)
AST: 110 U/L — ABNORMAL HIGH (ref 15–41)
Albumin: 1.5 g/dL — ABNORMAL LOW (ref 3.5–5.0)
Alkaline Phosphatase: 169 U/L — ABNORMAL HIGH (ref 38–126)
Anion gap: 7 (ref 5–15)
BUN: 18 mg/dL (ref 8–23)
CO2: 22 mmol/L (ref 22–32)
Calcium: 8.5 mg/dL — ABNORMAL LOW (ref 8.9–10.3)
Chloride: 94 mmol/L — ABNORMAL LOW (ref 98–111)
Creatinine, Ser: 0.62 mg/dL (ref 0.44–1.00)
GFR, Estimated: >60 mL/min (ref >60)
Glucose, Bld: 154 mg/dL — ABNORMAL HIGH (ref 70–99)
Potassium: 4.3 mmol/L (ref 3.5–5.1)
Sodium: 123 mmol/L — ABNORMAL LOW (ref 135–145)
Total Bilirubin: 6.7 mg/dL — ABNORMAL HIGH (ref 0.3–1.2)
Total Protein: 5.8 g/dL — ABNORMAL LOW (ref 6.5–8.1)

## 2020-10-29 MED ORDER — ALBUMIN HUMAN 25 % IV SOLN
50.0000 g | Freq: Three times a day (TID) | INTRAVENOUS | Status: AC
  Administered 2020-10-29 – 2020-10-31 (×6): 50 g via INTRAVENOUS
  Filled 2020-10-29 (×6): qty 200

## 2020-10-29 MED ORDER — SODIUM CHLORIDE 0.9 % IV SOLN
12.5000 mg | Freq: Three times a day (TID) | INTRAVENOUS | Status: DC | PRN
  Filled 2020-10-29: qty 0.5

## 2020-10-29 MED ORDER — IBUPROFEN 400 MG PO TABS: 400.0000 mg | ORAL_TABLET | Freq: Three times a day (TID) | ORAL | Status: DC | PRN

## 2020-10-29 NOTE — Progress Notes (Signed)
PROGRESS NOTE    Emmanuel Ercole  ZDG:[id number] DOB: 08/14/1950 DOA: 10/26/2020 PCP: [name], MD   Chief Complaint  Patient presents with  . Dehydration    Brief admission narrative:  As per H&P written by Dr. Clearence Ped on 10/27/20  Annell Canty  is a 70 y.o. 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  Patient reports that she has been on a hiatal hernia protocol, but 3 weeks ago she started having abdominal pain and vomiting that came on gradually and was getting progressively worse.  The abdominal pain feels like achy pain in her epigastrium and periumbilical region.  Does not radiate, its intermittent.  Nothing identified that makes it worse or better.  Decreased appetite associated.  She reports she started having diarrhea as well.  Patient denies any hematemesis or hematochezia or melena.  She reports because she was not feeling well she stopped taking all of her medications.  Patient reports that she had not really noticed that she was jaundiced.  Sister-in-law at bedside reports that she noticed the patient was jaundice on the 27th.  Patient does report that she has had a decrease in urine output but attributes that to decrease in p.o. intake.  She has been unsteady on her feet, with 2 falls in the last week, and fatigue.  She reports yesterday she was very somnolent and had a hard time getting up.  She has had generalized weakness as well.  One of her falls was 3 days ago included hitting her head.  She has had no changes in vision or hearing since then.  She has had no focal deficits.  Patient does report that her head still feels bruised.  Patient smokes a pack per day.  She does not drink alcohol.  She does not use illicit drugs.  She has been vaccinated for COVID.   Patient is DNR  In the ED Temp 97.9, heart rate 72, respiratory rate 17, blood pressure 130/54, satting at 98% White blood cell count 8.1, hemoglobin 12.0 Hyponatremic with a sodium of 124, hypokalemic with of potassium 3.2 Borderline hypoglycemic with a glucose of 73 UA is borderline urine culture pending Alk phos 201, albumin 1.8, lipase 47, bilirubin 8.9-up from 1 in July 2021 Patient did have an AFP recently but not during this visit that was 219 CT abdomen pelvis shows cirrhosis, portal vein hypertension, upper abdominal varices, new lesion within the lateral aspect of segment 3 of the left lobe, previous lesions mentioned are not seen again, nonemergent multiphase liver CT recommended, small volume ascites, mild infectious colitis cannot be excluded -given that patient has no white blood cell count, no fevers, colitis is thought to be lower on the differential Admission requested for inpatient consult with GI  Assessment & Plan: 1-hyponatremia/hypokalemia -In the setting of dehydration, poor oral intake and cirrhosis -Continue gentle hydration and follow electrolytes -Continue slowly advancing diet and assess tolerance -still with decrease oral intake and having vomiting events; Will continue the use of as needed Zofran; adding Phenergan for respiratory symptoms.  2-jaundice and abdominal pain -In the setting of hepatocellular carcinoma possibly invading portal vein -Following recommendations by gastroenterology service will get liver Doppler (currently pending). -Daily meld score labs -Continue as needed antiemetics outpatient follow-up with oncology service; in order to determine candidacy for treatment. -guarded prognosis  3-transaminitis -Continue to follow LFTs -In the setting of portal hypertension, hepatocellular carcinoma and underlying decompensated cirrhosis. -Follow results of liver Doppler  4-decompensated cirrhosis -Continue minimizing hepatotoxic agents -Follow  GI service recommendation -Continue Aldactone and Lasix.  5-type 2 diabetes mellitus -Continue to follow CBGs -Patient no eating much -Will continue to use sliding scale insulin if blood sugar above 200.  6-tobacco abuse -Cessation counseling provided -Continue nicotine patch  7-DNR -Patient expressed looking to treat the treatable but no wanting to pursue that invasive or artificial management. -Wishes will be respected -Follow clinical response.  DVT prophylaxis: heparin Code Status: DNR Family Communication: No family at bedside.  Patient expressed that she will contact her family members  And provide updates.  Disposition:   Status is: Inpatient  Dispo: The patient is from: Home              Anticipated d/c is to: Home              Patient currently not medically stable for discharge; still experiencing intermittent nausea, vomiting and abdominal discomfort.   Difficult to place patient no       Consultants:   GI   Procedures:  See below for x-ray reports.  Antimicrobials:  None   Subjective: No fever, no chest pain, no shortness of breath.  Reports intermittent nausea and 2 episodes of vomiting overnight. No abdominal pain  Objective: Vitals:   10/28/20 0348 10/28/20 1433 10/28/20 2028 10/29/20 0446  BP: (!) 107/50 (!) 110/48 (!) 112/51 (!) 116/54  Pulse: 83 79 71 76  Resp: 20  19 14   Temp: 97.8 F (36.6 C) (!) 97.4 F (36.3 C) 97.7 F (36.5 C) (!) 97.4 F (36.3 C)  TempSrc:  Oral    SpO2: 97% 100% 100% 99%  Weight:      Height:        Intake/Output Summary (Last 24 hours) at 10/29/2020 1201 Last data filed at 10/29/2020 0900 Gross per 24 hour  Intake 240 ml  Output --  Net 240 ml   Filed Weights   10/26/20 1918  Weight: 96.2 kg    Examination: General exam: Alert, awake, oriented x 3; reports intermittent nausea and vomiting x2 overnight.  No chest pain, no shortness of breath, less jaundiced. Respiratory system: Clear to auscultation.  Respiratory effort normal.  No using accessory muscles and no requiring oxygen supplementation. Cardiovascular system:RRR. No murmurs, rubs, gallops.  No JVD. Gastrointestinal system: Abdomen is mildly distended, no guarding, no pain, positive bowel sounds. Central nervous system: Alert and oriented. No focal neurological deficits. Extremities: Trace to 1+ edema appreciated in her legs bilaterally; unchanged from baseline according to patient.  No cyanosis or clubbing. Skin: No rashes, lesions or ulcers Psychiatry: Judgement and insight appear normal. Mood & affect appropriate.    Data Reviewed: I have personally reviewed following labs and imaging studies  CBC: Recent Labs  Lab 10/26/20 2056 10/27/20 0414  WBC 8.1 5.6  NEUTROABS 5.7 3.2  HGB 12.0 10.8*  HCT 33.9* 30.7*  MCV 85.0 83.7  PLT 159 143*    Basic Metabolic Panel: Recent Labs  Lab 10/26/20 2056 10/27/20 0414 10/29/20 0544  NA 124* 125* 123*  K 3.2* 3.3* 4.3  CL 92* 95* 94*  CO2 22 21* 22  GLUCOSE 73 40* 154*  BUN 17 15 18   CREATININE 0.97 0.71 0.62  CALCIUM 8.8* 8.3* 8.5*  MG  --  1.7  --     GFR: Estimated Creatinine Clearance: 79.3  mL/min (by C-G formula based on SCr of 0.62 mg/dL).  Liver Function Tests: Recent Labs  Lab 10/26/20 2056 10/27/20 0414 10/27/20 1111 10/29/20 0544  AST 136* 117*  --  110*  ALT 78* 66*  --  69*  ALKPHOS 201* 167*  --  169*  BILITOT 8.9* 7.1* 7.7* 6.7*  PROT 6.9 5.9*  --  5.8*  ALBUMIN 1.8* 1.5*  --  1.5*    CBG: Recent Labs  Lab 10/27/20 0729 10/27/20 0800  GLUCAP 52* 75    Recent Results (from the past 240 hour(s))  SARS CORONAVIRUS 2 (TAT 6-24 HRS) Nasopharyngeal Nasopharyngeal Swab     Status: None   Collection Time: 10/27/20 12:46 AM   Specimen: Nasopharyngeal Swab  Result Value Ref Range Status   SARS Coronavirus 2 NEGATIVE NEGATIVE Final    Comment: (NOTE) SARS-CoV-2 target nucleic acids are NOT DETECTED.  The SARS-CoV-2 RNA is generally  detectable in upper and lower respiratory specimens during the acute phase of infection. Negative results do not preclude SARS-CoV-2 infection, do not rule out co-infections with other pathogens, and should not be used as the sole basis for treatment or other patient management decisions. Negative results must be combined with clinical observations, patient history, and epidemiological information. The expected result is Negative.  Fact Sheet for Patients: SugarRoll.be  Fact Sheet for Healthcare Providers: https://www.woods-mathews.com/  This test is not yet approved or cleared by the [place] FDA and  has been authorized for detection and/or diagnosis of SARS-CoV-2 by FDA under an Emergency Use Authorization (EUA). This EUA will remain  in effect (meaning this test can be used) for the duration of the COVID-19 declaration under Se ction 564(b)(1) of the Act, 21 U.S.C. section 360bbb-3(b)(1), unless the authorization is terminated or revoked sooner.  Performed at [facility], [place] [address], [place], [place] [address]      Radiology Studies: DG CHEST PORT 1 VIEW  Result Date: 10/27/2020 CLINICAL DATA:  70 year old female with history of cough. EXAM: PORTABLE CHEST - 1 VIEW COMPARISON:  Chest CT from 01/28/2020 FINDINGS: The mediastinal contours are within normal limits. No cardiomegaly. The lungs are clear bilaterally without evidence of focal consolidation, pleural effusion, or pneumothorax. The known pulmonary nodules visualized on comparison chest CT are not radiographically apparent. Surgical clips in the left axilla. No acute osseous abnormality. IMPRESSION: No acute cardiopulmonary process. Electronically Signed   By: Ruthann Cancer MD   On: 10/27/2020 16:12   CT LIVER ABDOMEN W WO CONTRAST  Result Date: 10/27/2020 CLINICAL DATA:  Dehydration with jaundice, weakness and fatigue. History of cirrhosis, hypertension, diabetes and  breast cancer. EXAM: CT ABDOMEN WITHOUT AND WITH CONTRAST TECHNIQUE: Multidetector CT imaging of the abdomen was performed following the standard protocol before and following the bolus administration of intravenous contrast. CONTRAST:  114m OMNIPAQUE IOHEXOL 300 MG/ML  SOLN COMPARISON:  Abdominopelvic CT 10/26/2020. Abdominal MRI 07/04/2020. FINDINGS: Lower chest: Stable right middle lobe nodularity. Atherosclerosis of the aorta and coronary arteries. No significant pleural or pericardial effusion. Hepatobiliary: Morphologic changes of cirrhosis are again noted. No arterial phase enhancing lesions are identified within the liver. However, a low-density lesion posteriorly in the left hepatic lobe appears enlarged compared with the previous MRI, measuring up to 4.9 x 3.7 cm on image 40/11. This demonstrates contrast washout on the delayed images. As seen on recent CT, there is a low-density lesion more peripherally in the lateral segment of the left lobe, measuring up to 2.6 cm on image  57/6. This has a nonspecific appearance and could reflect intrahepatic ductal dilatation. There is apparent new occlusion of the intrahepatic portal vein, suspicious for tumor thrombus. There is grossly stable extrahepatic biliary dilatation post cholecystectomy. No significant intrahepatic biliary dilatation. Pancreas: Unremarkable. No pancreatic ductal dilatation or surrounding inflammatory changes. Spleen: Stable mild splenomegaly without focal abnormality. Adrenals/Urinary Tract: The adrenal glands appear unchanged. No evidence of urinary tract calculus or hydronephrosis. Small bilateral renal cysts. Stomach/Bowel: Enteric contrast was administered and has passed into the colon. The stomach is decompressed. There is no significant small bowel distension or wall thickening. However, there is diffuse wall thickening of the visualized colon which appears progressive compared with recent CT, suspicious for colitis.  Vascular/Lymphatic: Grossly stable adenopathy in the porta hepatis and retroperitoneum. There are multiple venous collaterals in the left upper quadrant. As above, poor visualization of contrast in the portal vein, suspicious for portal vein tumor thrombus. Other: Small volume of ascites, increased from previous MRI. Musculoskeletal: No acute or significant osseous findings. Multilevel lumbar spondylosis. IMPRESSION: 1. Enlarging lesions in the left hepatic lobe without obvious arterial phase enhancement, but washout on the delayed images and probable portal vein tumor thrombus. In this patient with an elevated serum AFP level, these findings are worrisome for progressive multifocal hepatocellular carcinoma. 2. Progressive wall thickening throughout the visualized colon suspicious for colitis. This pattern can be seen with Clostridium difficile colitis. 3. Stable sequela of cirrhosis and portal hypertension with multiple venous collaterals in the left upper quadrant and mild ascites. 4. Grossly stable extrahepatic biliary dilatation post cholecystectomy. Electronically Signed   By: Richardean Sale M.D.   On: 10/27/2020 19:19   Scheduled Meds: . (feeding supplement) PROSource Plus  30 mL Oral BID BM  . ALPRAZolam  0.25 mg Oral BID  . Chlorhexidine Gluconate Cloth  6 each Topical Daily  . citalopram  20 mg Oral Daily  . feeding supplement  237 mL Oral BID BM  . furosemide  20 mg Oral Daily  . heparin  5,000 Units Subcutaneous Q8H  . lisinopril  5 mg Oral Daily  . nicotine  21 mg Transdermal Daily  . pantoprazole  40 mg Oral QAC breakfast  . spironolactone  25 mg Oral Daily  . venlafaxine XR  75 mg Oral BID   Continuous Infusions: . albumin human       LOS: 2 days    Time spent: 30 minutes    Barton Dubois, MD [facility]   To contact the attending provider between 7A-7P or the covering provider during after hours 7P-7A, please log into the web site [email or web address] and access using  universal [place] password for that web site. If you do not have the password, please call the hospital operator.  10/29/2020, 12:01 PM

## 2020-10-29 NOTE — Progress Notes (Signed)
Taylor Williams, M.D. Gastroenterology & Hepatology   Interval History:  Patient reports that she had an episode of vomiting yesterday x2 overnight.  She states that she was feeling well during the day but she had sudden onset of vomiting, was given Zofran as needed for this.  Today she tolerated her breakfast and did not have any more nausea or vomiting.  Denies any abdominal pain or discomfort. Labs today showed mild improvement in her total bilirubin to 6.7, albumin remains low 1.5.  ALT is 69 and AST is 110 stable compared to prior.  Sodium remains low 123.  No other labs are available today.  Inpatient Medications:  Current Facility-Administered Medications:  .  (feeding supplement) PROSource Plus liquid 30 mL, 30 mL, Oral, BID BM, Barton Dubois, MD, 30 mL at 10/29/20 0851 .  ALPRAZolam (XANAX) tablet 0.25 mg, 0.25 mg, Oral, BID, Zierle-Ghosh, Asia B, DO, 0.25 mg at 10/29/20 0851 .  Chlorhexidine Gluconate Cloth 2 % PADS 6 each, 6 each, Topical, Daily, Barton Dubois, MD, 6 each at 10/29/20 [phone] .  citalopram (CELEXA) tablet 20 mg, 20 mg, Oral, Daily, Zierle-Ghosh, Asia B, DO, 20 mg at 10/29/20 0852 .  feeding supplement (ENSURE ENLIVE / ENSURE PLUS) liquid 237 mL, 237 mL, Oral, BID BM, Zierle-Ghosh, Asia B, DO, 237 mL at 10/28/20 0823 .  furosemide (LASIX) tablet 20 mg, 20 mg, Oral, Daily, Zierle-Ghosh, Asia B, DO, 20 mg at 10/29/20 0852 .  heparin injection 5,000 Units, 5,000 Units, Subcutaneous, Q8H, Zierle-Ghosh, Asia B, DO, 5,000 Units at 10/29/20 0552 .  ibuprofen (ADVIL) tablet 400 mg, 400 mg, Oral, Q6H PRN, Zierle-Ghosh, Asia B, DO .  lisinopril (ZESTRIL) tablet 5 mg, 5 mg, Oral, Daily, Zierle-Ghosh, Asia B, DO, 5 mg at 10/29/20 0852 .  nicotine (NICODERM CQ - dosed in mg/24 hours) patch 21 mg, 21 mg, Transdermal, Daily, Zierle-Ghosh, Asia B, DO, 21 mg at 10/29/20 0853 .  ondansetron (ZOFRAN) tablet 4 mg, 4 mg, Oral, Q6H PRN **OR** ondansetron (ZOFRAN) injection 4 mg, 4 mg,  Intravenous, Q6H PRN, Zierle-Ghosh, Asia B, DO, 4 mg at 10/29/20 0211 .  oxyCODONE (Oxy IR/ROXICODONE) immediate release tablet 5 mg, 5 mg, Oral, Q4H PRN, Zierle-Ghosh, Asia B, DO .  pantoprazole (PROTONIX) EC tablet 40 mg, 40 mg, Oral, QAC breakfast, Aliene Altes S, PA-C, 40 mg at 10/29/20 0851 .  spironolactone (ALDACTONE) tablet 25 mg, 25 mg, Oral, Daily, Zierle-Ghosh, Asia B, DO, 25 mg at 10/29/20 0851 .  venlafaxine XR (EFFEXOR-XR) 24 hr capsule 75 mg, 75 mg, Oral, BID, Zierle-Ghosh, Asia B, DO, 75 mg at 10/29/20 0852   I/O    Intake/Output Summary (Last 24 hours) at 10/29/2020 1112 Last data filed at 10/29/2020 0900 Gross per 24 hour  Intake 240 ml  Output --  Net 240 ml     Physical Exam: Temp:  [97.4 F (36.3 C)-97.7 F (36.5 C)] 97.4 F (36.3 C) (04/03 0446) Pulse Rate:  [71-79] 76 (04/03 0446) Resp:  [14-19] 14 (04/03 0446) BP: (110-116)/(48-54) 116/54 (04/03 0446) SpO2:  [99 %-100 %] 99 % (04/03 0446)  Temp (24hrs), Avg:97.5 F (36.4 C), Min:97.4 F (36.3 C), Max:97.7 F (36.5 C) GENERAL: The patient is AO x3, in no acute distress. HEENT: Head is normocephalic and atraumatic. EOMI are intact. Mouth is well hydrated and without lesions. NECK: Supple. No masses LUNGS: Clear to auscultation. No presence of rhonchi/wheezing/rales. Adequate chest expansion HEART: RRR, normal s1 and s2. ABDOMEN: NTTP, no guarding, no peritoneal signs, and nondistended. BS +.  No masses. EXTREMITIES: Without any cyanosis, clubbing, rash, lesions. Has +1 pitting edema bilaterally.  NEUROLOGIC: AOx3, no focal motor deficit. SKIN: has generalized jaundice, no rashes  Laboratory Data: CBC:     Component Value Date/Time   WBC 5.6 10/27/2020 0414   RBC 3.67 (L) 10/27/2020 0414   HGB 10.8 (L) 10/27/2020 0414   HCT 30.7 (L) 10/27/2020 0414   PLT 143 (L) 10/27/2020 0414   MCV 83.7 10/27/2020 0414   MCH 29.4 10/27/2020 0414   MCHC 35.2 10/27/2020 0414   RDW 20.7 (H) 10/27/2020 0414    LYMPHSABS 1.2 10/27/2020 0414   MONOABS 0.8 10/27/2020 0414   EOSABS 0.2 10/27/2020 0414   BASOSABS 0.0 10/27/2020 0414   COAG:  Lab Results  Component Value Date   INR 1.6 (H) 10/27/2020   INR 1.0 03/07/2020   INR 1.1 09/08/2019    BMP:  BMP Latest Ref Rng & Units 10/29/2020 10/27/2020 10/26/2020  Glucose 70 - 99 mg/dL 154(H) 40(LL) 73  BUN 8 - 23 mg/dL 18 15 17   Creatinine 0.44 - 1.00 mg/dL 0.62 0.71 0.97  Sodium 135 - 145 mmol/L 123(L) 125(L) 124(L)  Potassium 3.5 - 5.1 mmol/L 4.3 3.3(L) 3.2(L)  Chloride 98 - 111 mmol/L 94(L) 95(L) 92(L)  CO2 22 - 32 mmol/L 22 21(L) 22  Calcium 8.9 - 10.3 mg/dL 8.5(L) 8.3(L) 8.8(L)    HEPATIC:  Hepatic Function Latest Ref Rng & Units 10/29/2020 10/27/2020 10/27/2020  Total Protein 6.5 - 8.1 g/dL 5.8(L) - 5.9(L)  Albumin 3.5 - 5.0 g/dL 1.5(L) - 1.5(L)  AST 15 - 41 U/L 110(H) - 117(H)  ALT 0 - 44 U/L 69(H) - 66(H)  Alk Phosphatase 38 - 126 U/L 169(H) - 167(H)  Total Bilirubin 0.3 - 1.2 mg/dL 6.7(H) 7.7(H) 7.1(H)  Bilirubin, Direct 0.0 - 0.2 mg/dL - 3.8(H) -    CARDIAC: No results found for: CKTOTAL, CKMB, CKMBINDEX, TROPONINI    Imaging: I personally reviewed and interpreted the available labs, imaging and endoscopic files.   Assessment/Plan: This is a 70 year old female with past medical history of NASH cirrhosis with recent decompensation due to HCC,GERD, history of breast cancer, diabetes, hyperlipidemia, hypertension, depression, anxiety and history of H. pylori infection, who came to the hospital after she presented recurrent episodes of vomiting, nausea and upper abdominal pain along with watery diarrhea. Patient was also found to have increasing bilirubin on admission. MELD Na could not be calculated today but total bilirubin has been decreasing.In terms for her initial symptoms, I consider that her symptoms are secondary to her [facility].  There is no presence of obstruction in her GI tract in the most recent imaging.  Her stool has been formed and  due to this I have a low suspicion this could be C. difficile or another gastrointestinal infection.Consideration for an EGD should be held if the patient persists with nausea but for now she would benefit from Zofran and she can use also Phenergan as needed if she is still presenting significant refractory nausea and vomiting.  Regarding her recent cirrhosis decompensation, this was likely triggered by [facility], as she had a negative CXR and a normal urinalysis. Unfortunately her CT liver was confirmatory for a 4.9 cm HCC which possibly has invaded the portal vein. Due to this, she is not a candidate for OLT or locoregional therapy, which I thoroughly discussed with the patient. She may benefit from evaluation by oncology to determine her candidacy for Atezolizumab plus Bevacizumab vs sorafenib vs levatinib.. Liver Doppler is  pending to confirm presence of tumor thrombus.  # Subacute nausea and vomiting # HCC, possibly invading portal vein #  Decompensated alcoholic cirrhosis - Daily MELD labs - Obtain liver Doppler - Oncology evaluation, could be held as outpatient - Zofran as needed for nausea, can add Phenergan if refractory to this medication -Advance diet as tolerated  Taylor Peppers, MD Gastroenterology and Hepatology [facility]

## 2020-10-30 ENCOUNTER — Inpatient Hospital Stay (HOSPITAL_COMMUNITY): Payer: Medicare Other

## 2020-10-30 DIAGNOSIS — E876 Hypokalemia: Secondary | ICD-10-CM

## 2020-10-30 DIAGNOSIS — R17 Unspecified jaundice: Secondary | ICD-10-CM | POA: Diagnosis not present

## 2020-10-30 DIAGNOSIS — C801 Malignant (primary) neoplasm, unspecified: Secondary | ICD-10-CM

## 2020-10-30 DIAGNOSIS — E871 Hypo-osmolality and hyponatremia: Secondary | ICD-10-CM

## 2020-10-30 DIAGNOSIS — R1010 Upper abdominal pain, unspecified: Secondary | ICD-10-CM

## 2020-10-30 LAB — COMPREHENSIVE METABOLIC PANEL
ALT: 61 U/L — ABNORMAL HIGH (ref 0–44)
AST: 98 U/L — ABNORMAL HIGH (ref 15–41)
Albumin: 2.2 g/dL — ABNORMAL LOW (ref 3.5–5.0)
Alkaline Phosphatase: 137 U/L — ABNORMAL HIGH (ref 38–126)
Anion gap: 9 (ref 5–15)
BUN: 16 mg/dL (ref 8–23)
CO2: 21 mmol/L — ABNORMAL LOW (ref 22–32)
Calcium: 8.8 mg/dL — ABNORMAL LOW (ref 8.9–10.3)
Chloride: 96 mmol/L — ABNORMAL LOW (ref 98–111)
Creatinine, Ser: 0.57 mg/dL (ref 0.44–1.00)
GFR, Estimated: >60 mL/min (ref >60)
Glucose, Bld: 102 mg/dL — ABNORMAL HIGH (ref 70–99)
Potassium: 4.1 mmol/L (ref 3.5–5.1)
Sodium: 126 mmol/L — ABNORMAL LOW (ref 135–145)
Total Bilirubin: 6.8 mg/dL — ABNORMAL HIGH (ref 0.3–1.2)
Total Protein: 5.8 g/dL — ABNORMAL LOW (ref 6.5–8.1)

## 2020-10-30 LAB — CBC
HCT: 28.5 % — ABNORMAL LOW (ref 36.0–46.0)
Hemoglobin: 10.1 g/dL — ABNORMAL LOW (ref 12.0–15.0)
MCH: 30.3 pg (ref 26.0–34.0)
MCHC: 35.4 g/dL (ref 30.0–36.0)
MCV: 85.6 fL (ref 80.0–100.0)
Platelets: 105 10*3/uL — ABNORMAL LOW (ref 150–400)
RBC: 3.33 MIL/uL — ABNORMAL LOW (ref 3.87–5.11)
RDW: 21.2 % — ABNORMAL HIGH (ref 11.5–15.5)
WBC: 3.3 10*3/uL — ABNORMAL LOW (ref 4.0–10.5)
nRBC: 0 % (ref 0.0–0.2)

## 2020-10-30 LAB — PROTIME-INR
INR: 1.6 — ABNORMAL HIGH (ref 0.8–1.2)
Prothrombin Time: 18.7 seconds — ABNORMAL HIGH (ref 11.4–15.2)

## 2020-10-30 NOTE — Progress Notes (Signed)
    Subjective: No abdominal pain, N/V. No diarrhea. Had soft stool yesterday. No overt GI bleeding. Tolerating diet. Denies confusion or mental status changes.   Objective: Vital signs in last 24 hours: Temp:  [97.5 F (36.4 C)-98.2 F (36.8 C)] 98.2 F (36.8 C) (04/04 0408) Pulse Rate:  [71-79] 79 (04/04 0408) Resp:  [14-20] 14 (04/04 0408) BP: (98-124)/(52-63) 124/52 (04/04 0806) SpO2:  [100 %] 100 % (04/04 0408) Last BM Date: 10/29/20 General:   Alert and oriented, jaundiced, pleasant.  Head:  Normocephalic and atraumatic. Eyes:  +scleral icterus Abdomen:  Bowel sounds present, soft, non-tender, non-distended. No HSM or hernias noted. No rebound or guarding. No masses appreciated  Extremities:  With pedal edema and 1+ lower extremity pitting edema Neurologic:  Alert and  oriented x4;  Negative asterixis Psych:  Normal mood and affect.  Intake/Output from previous day: 04/03 0701 - 04/04 0700 In: 844.9 [P.O.:720; IV Piggyback:124.9] Out: 650 [Urine:650] Intake/Output this shift: No intake/output data recorded.  Lab Results: Recent Labs    10/30/20 0518  WBC 3.3*  HGB 10.1*  HCT 28.5*  PLT 105*   BMET Recent Labs    10/29/20 0544 10/30/20 0518  NA 123* 126*  K 4.3 4.1  CL 94* 96*  CO2 22 21*  GLUCOSE 154* 102*  BUN 18 16  CREATININE 0.62 0.57  CALCIUM 8.5* 8.8*   LFT Recent Labs    10/27/20 1111 10/29/20 0544 10/30/20 0518  PROT  --  5.8* 5.8*  ALBUMIN  --  1.5* 2.2*  AST  --  110* 98*  ALT  --  69* 61*  ALKPHOS  --  169* 137*  BILITOT 7.7* 6.7* 6.8*  BILIDIR 3.8*  --   --   IBILI 3.9*  --   --    PT/INR Recent Labs    10/30/20 0518  LABPROT 18.7*  INR 1.6*    Assessment: 70 year old female with history of NASH cirrhosis (MELD Na 27 today) and prior elevated AFP in 08/2019, known hepatic lesions on MRI liver Dec 2021 with concern for [facility] (AFP 219 in Nov 2021), presenting with decompensation and acute abdominal pain, vomiting, and diarrhea  with hyponatremia, hypokalemia, and elevated LFTs. CT Liver 10/27/20 this admission showed enlarging lesions in left hepatic lobe, probable portal vein tumor thrombus, progressive wall thickening suspicious for colitis, further findings under results.   Probable portal vein tumor thrombus: doppler ultrasound today  Colitis: stool formed. Query findings on CT may be moreso related to colopathy in setting of end-stage liver disease. Recommend stool studies if recurrent diarrhea.   Cirrhosis: MELD Na 27 today. Tbili improved from admission, and transaminases mildly improved. INR stable at 1.6. No hepatic encephalopathy appreciated.   Abdominal pain, N/V/D: resolved.    Plan: Awaiting doppler US Follow LFTs, INR Stool studies if recurrent diarrhea Oncology evaluation as outpatient Supportive measures PPI daily   Annitta Needs, PhD, ANP-BC [facility]    LOS: 3 days    10/30/2020, 8:53 AM

## 2020-10-30 NOTE — Progress Notes (Signed)
PROGRESS NOTE    Taylor Williams  VEL:[id number] DOB: 01/09/1951 DOA: 10/26/2020 PCP: [name], MD   Chief Complaint  Patient presents with  . Dehydration    Brief admission narrative:  As per H&P written by Dr. Clearence Ped on 10/27/20  Taylor Williams  is a 70 y.o. 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  Patient reports that she has been on a hiatal hernia protocol, but 3 weeks ago she started having abdominal pain and vomiting that came on gradually and was getting progressively worse.  The abdominal pain feels like achy pain in her epigastrium and periumbilical region.  Does not radiate, its intermittent.  Nothing identified that makes it worse or better.  Decreased appetite associated.  She reports she started having diarrhea as well.  Patient denies any hematemesis or hematochezia or melena.  She reports because she was not feeling well she stopped taking all of her medications.  Patient reports that she had not really noticed that she was jaundiced.  Sister-in-law at bedside reports that she noticed the patient was jaundice on the 27th.  Patient does report that she has had a decrease in urine output but attributes that to decrease in p.o. intake.  She has been unsteady on her feet, with 2 falls in the last week, and fatigue.  She reports yesterday she was very somnolent and had a hard time getting up.  She has had generalized weakness as well.  One of her falls was 3 days ago included hitting her head.  She has had no changes in vision or hearing since then.  She has had no focal deficits.  Patient does report that her head still feels bruised.  Patient smokes a pack per day.  She does not drink alcohol.  She does not use illicit drugs.  She has been vaccinated for COVID.   Patient is DNR  In the ED Temp 97.9, heart rate 72, respiratory rate 17, blood pressure 130/54, satting at 98% White blood cell count 8.1, hemoglobin 12.0 Hyponatremic with a sodium of 124, hypokalemic with of potassium 3.2 Borderline hypoglycemic with a glucose of 73 UA is borderline urine culture pending Alk phos 201, albumin 1.8, lipase 47, bilirubin 8.9-up from 1 in July 2021 Patient did have an AFP recently but not during this visit that was 219 CT abdomen pelvis shows cirrhosis, portal vein hypertension, upper abdominal varices, new lesion within the lateral aspect of segment 3 of the left lobe, previous lesions mentioned are not seen again, nonemergent multiphase liver CT recommended, small volume ascites, mild infectious colitis cannot be excluded -given that patient has no white blood cell count, no fevers, colitis is thought to be lower on the differential Admission requested for inpatient consult with GI  Assessment & Plan: 1-hyponatremia/hypokalemia -In the setting of dehydration, poor oral intake and cirrhosis -Continue gentle hydration and follow electrolytes -Continue slowly advancing diet and assess tolerance -still with decrease oral intake and having vomiting events; Will continue the use of as needed Zofran; adding Phenergan for respiratory symptoms.  2-jaundice and abdominal pain -In the setting of hepatocellular carcinoma possibly invading portal vein -Following recommendations by gastroenterology service will get liver Doppler (currently pending). -Daily meld score labs -Continue as needed antiemetics outpatient follow-up with oncology service; in order to determine candidacy for treatment. -guarded prognosis  3-transaminitis -Continue to follow LFTs -In the setting of portal hypertension, hepatocellular carcinoma and underlying decompensated cirrhosis. -Follow results of liver Doppler  4-decompensated cirrhosis -Continue minimizing hepatotoxic agents -Follow  GI service recommendation -Continue Aldactone and Lasix.  5-type 2 diabetes mellitus -Continue to follow CBGs -Patient no eating much -Will continue to use sliding scale insulin if blood sugar above 200.  6-tobacco abuse -Cessation counseling provided -Continue nicotine patch  7-DNR -Patient expressed looking to treat the treatable but no wanting to pursue that invasive or artificial management. -Wishes will be respected -Follow clinical response.  DVT prophylaxis: heparin Code Status: DNR Family Communication: No family at bedside.  Patient expressed that she will contact her family members  And provide updates.  Disposition:   Status is: Inpatient  Dispo: The patient is from: Home              Anticipated d/c is to: Home              Patient currently not medically stable for discharge; still experiencing intermittent nausea, vomiting and abdominal discomfort.   Difficult to place patient no    Consultants:   GI   Procedures:  See below for x-ray reports.  Antimicrobials:  None   Subjective: No fever, no chest pain, no shortness of breath.  Patient reports feeling slightly nauseous) no vomiting today.  Overnight reporting diarrhea and one episode of vomiting.  Objective: Vitals:   10/29/20 1337 10/29/20 2021 10/30/20 0408 10/30/20 0806  BP: 114/63 (!) 109/55 (!) 98/57 (!) 124/52  Pulse: 71 74 79   Resp: 20 14 14    Temp: (!) 97.5 F (36.4 C) 97.7 F (36.5 C) 98.2 F (36.8 C)   TempSrc: Oral Oral Oral   SpO2: 100% 100% 100%   Weight:      Height:        Intake/Output Summary (Last 24 hours) at 10/30/2020 1902 Last data filed at 10/30/2020 1300 Gross per 24 hour  Intake 960 ml  Output 200 ml  Net 760 ml   Filed Weights   10/26/20 1918  Weight: 96.2 kg    Examination: General exam: Alert, awake, oriented x 3; expressed diarrhea overnight and some intermittent nausea; patient reported no vomiting and denies abd pain.  Respiratory system: Clear to  auscultation. No using accessory muscles.  Cardiovascular system: RRR. No murmurs, rubs, gallops. No JVD. Gastrointestinal system: Abdomen is mildly distended, soft and nontender. Positive BS. Central nervous system: Alert and oriented. No focal neurological deficits. Extremities: No cyanosis or clubbing.  Trace edema appreciated bilaterally; unchanged from baseline. Skin: No petechiae. Psychiatry: Judgement and insight appear normal. Mood & affect appropriate.    Data Reviewed: I have personally reviewed following labs and imaging studies  CBC: Recent Labs  Lab 10/26/20 2056 10/27/20 0414 10/30/20 0518  WBC 8.1 5.6 3.3*  NEUTROABS 5.7 3.2  --   HGB 12.0 10.8* 10.1*  HCT 33.9* 30.7* 28.5*  MCV 85.0 83.7 85.6  PLT 159 143* 105*    Basic Metabolic Panel: Recent Labs  Lab 10/26/20 2056 10/27/20 0414 10/29/20 0544 10/30/20 0518  NA 124* 125* 123* 126*  K 3.2* 3.3* 4.3 4.1  CL 92* 95* 94* 96*  CO2 22 21* 22 21*  GLUCOSE 73 40* 154* 102*  BUN 17 15 18 16   CREATININE 0.97 0.71 0.62 0.57  CALCIUM 8.8* 8.3* 8.5* 8.8*  MG  --  1.7  --   --     GFR: Estimated Creatinine Clearance: 79.3 mL/min (  by C-G formula based on SCr of 0.57 mg/dL).  Liver Function Tests: Recent Labs  Lab 10/26/20 2056 10/27/20 0414 10/27/20 1111 10/29/20 0544 10/30/20 0518  AST 136* 117*  --  110* 98*  ALT 78* 66*  --  69* 61*  ALKPHOS 201* 167*  --  169* 137*  BILITOT 8.9* 7.1* 7.7* 6.7* 6.8*  PROT 6.9 5.9*  --  5.8* 5.8*  ALBUMIN 1.8* 1.5*  --  1.5* 2.2*    CBG: Recent Labs  Lab 10/27/20 0729 10/27/20 0800  GLUCAP 52* 75    Recent Results (from the past 240 hour(s))  SARS CORONAVIRUS 2 (TAT 6-24 HRS) Nasopharyngeal Nasopharyngeal Swab     Status: None   Collection Time: 10/27/20 12:46 AM   Specimen: Nasopharyngeal Swab  Result Value Ref Range Status   SARS Coronavirus 2 NEGATIVE NEGATIVE Final    Comment: (NOTE) SARS-CoV-2 target nucleic acids are NOT DETECTED.  The  SARS-CoV-2 RNA is generally detectable in upper and lower respiratory specimens during the acute phase of infection. Negative results do not preclude SARS-CoV-2 infection, do not rule out co-infections with other pathogens, and should not be used as the sole basis for treatment or other patient management decisions. Negative results must be combined with clinical observations, patient history, and epidemiological information. The expected result is Negative.  Fact Sheet for Patients: SugarRoll.be  Fact Sheet for Healthcare Providers: https://www.woods-mathews.com/  This test is not yet approved or cleared by the [place] FDA and  has been authorized for detection and/or diagnosis of SARS-CoV-2 by FDA under an Emergency Use Authorization (EUA). This EUA will remain  in effect (meaning this test can be used) for the duration of the COVID-19 declaration under Se ction 564(b)(1) of the Act, 21 U.S.C. section 360bbb-3(b)(1), unless the authorization is terminated or revoked sooner.  Performed at [facility], [place] [address] [place], [place] [address]      Radiology Studies: No results found. Scheduled Meds: . (feeding supplement) PROSource Plus  30 mL Oral BID BM  . ALPRAZolam  0.25 mg Oral BID  . Chlorhexidine Gluconate Cloth  6 each Topical Daily  . citalopram  20 mg Oral Daily  . feeding supplement  237 mL Oral BID BM  . furosemide  20 mg Oral Daily  . heparin  5,000 Units Subcutaneous Q8H  . lisinopril  5 mg Oral Daily  . nicotine  21 mg Transdermal Daily  . pantoprazole  40 mg Oral QAC breakfast  . spironolactone  25 mg Oral Daily  . venlafaxine XR  75 mg Oral BID   Continuous Infusions: . albumin human 50 g (10/30/20 1532)  . promethazine (PHENERGAN) injection       LOS: 3 days    Time spent: 30 minutes   Barton Dubois, MD [facility]   To contact the attending provider between 7A-7P or the covering  provider during after hours 7P-7A, please log into the web site [email or web address] and access using universal [place] password for that web site. If you do not have the password, please call the hospital operator.  10/30/2020, 7:02 PM

## 2020-10-31 ENCOUNTER — Telehealth: Payer: Self-pay | Admitting: Gastroenterology

## 2020-10-31 ENCOUNTER — Inpatient Hospital Stay (HOSPITAL_COMMUNITY): Payer: Medicare Other

## 2020-10-31 ENCOUNTER — Encounter (HOSPITAL_COMMUNITY): Payer: Self-pay

## 2020-10-31 ENCOUNTER — Ambulatory Visit (HOSPITAL_COMMUNITY): Payer: Medicare Other

## 2020-10-31 DIAGNOSIS — D649 Anemia, unspecified: Secondary | ICD-10-CM

## 2020-10-31 DIAGNOSIS — R188 Other ascites: Secondary | ICD-10-CM

## 2020-10-31 DIAGNOSIS — C229 Malignant neoplasm of liver, not specified as primary or secondary: Secondary | ICD-10-CM

## 2020-10-31 LAB — COMPREHENSIVE METABOLIC PANEL
ALT: 54 U/L — ABNORMAL HIGH (ref 0–44)
AST: 89 U/L — ABNORMAL HIGH (ref 15–41)
Albumin: 3.4 g/dL — ABNORMAL LOW (ref 3.5–5.0)
Alkaline Phosphatase: 116 U/L (ref 38–126)
Anion gap: 9 (ref 5–15)
BUN: 14 mg/dL (ref 8–23)
CO2: 23 mmol/L (ref 22–32)
Calcium: 9.3 mg/dL (ref 8.9–10.3)
Chloride: 97 mmol/L — ABNORMAL LOW (ref 98–111)
Creatinine, Ser: 0.47 mg/dL (ref 0.44–1.00)
GFR, Estimated: >60 mL/min (ref >60)
Glucose, Bld: 140 mg/dL — ABNORMAL HIGH (ref 70–99)
Potassium: 4 mmol/L (ref 3.5–5.1)
Sodium: 129 mmol/L — ABNORMAL LOW (ref 135–145)
Total Bilirubin: 6.5 mg/dL — ABNORMAL HIGH (ref 0.3–1.2)
Total Protein: 6.4 g/dL — ABNORMAL LOW (ref 6.5–8.1)

## 2020-10-31 LAB — CBC
HCT: 24.6 % — ABNORMAL LOW (ref 36.0–46.0)
Hemoglobin: 8.7 g/dL — ABNORMAL LOW (ref 12.0–15.0)
MCH: 30.1 pg (ref 26.0–34.0)
MCHC: 35.4 g/dL (ref 30.0–36.0)
MCV: 85.1 fL (ref 80.0–100.0)
Platelets: 79 10*3/uL — ABNORMAL LOW (ref 150–400)
RBC: 2.89 MIL/uL — ABNORMAL LOW (ref 3.87–5.11)
RDW: 20.8 % — ABNORMAL HIGH (ref 11.5–15.5)
WBC: 2.8 10*3/uL — ABNORMAL LOW (ref 4.0–10.5)
nRBC: 0 % (ref 0.0–0.2)

## 2020-10-31 LAB — PROTIME-INR
INR: 1.8 — ABNORMAL HIGH (ref 0.8–1.2)
Prothrombin Time: 20 seconds — ABNORMAL HIGH (ref 11.4–15.2)

## 2020-10-31 LAB — AMMONIA: Ammonia: 51 umol/L — ABNORMAL HIGH (ref 9–35)

## 2020-10-31 NOTE — Progress Notes (Signed)
Subjective: More tired today. A little foggy headed but no overt confusion. No abdominal pain, nausea, or vomiting. Doesn't have an appetite. 1 loose BM yesterday. No BM today.  Denies brbpr or melena.   Objective: Vital signs in last 24 hours: Temp:  [97.3 F (36.3 C)-97.6 F (36.4 C)] 97.4 F (36.3 C) (04/05 0831) Pulse Rate:  [70-78] 78 (04/05 1014) Resp:  [18-20] 18 (04/05 0831) BP: (104-114)/(41-51) 105/48 (04/05 1014) SpO2:  [94 %-100 %] 97 % (04/05 0831) Last BM Date: 10/30/20 General:   Alert and oriented x4 though somewhat slow to respond at times. Sitting in recliner. No acute distress.  Head:  Normocephalic and atraumatic. Eyes:  + sclera clear.   Abdomen:  Bowel sounds present, soft, non-tender, non-distended. No HSM or hernias noted. No rebound or guarding. No masses appreciated  Msk:  Symmetrical without gross deformities. Normal posture. Extremities:  Unilateral edema of the right arm with mild erythema at the wrist and above the elbow. IV is in this arm. No pain. 1+ bilateral LE edema up to mid shin.   Neurologic:  Alert and  oriented x4, slow to respond at times. No asterixis.  Psych:  Normal mood and affect.  Intake/Output from previous day: 04/04 0701 - 04/05 0700 In: 960 [P.O.:960] Out: -  Intake/Output this shift: No intake/output data recorded.  Lab Results: Recent Labs    10/30/20 0518 10/31/20 0459  WBC 3.3* 2.8*  HGB 10.1* 8.7*  HCT 28.5* 24.6*  PLT 105* 79*   BMET Recent Labs    10/29/20 0544 10/30/20 0518 10/31/20 0459  NA 123* 126* 129*  K 4.3 4.1 4.0  CL 94* 96* 97*  CO2 22 21* 23  GLUCOSE 154* 102* 140*  BUN 18 16 14   CREATININE 0.62 0.57 0.47  CALCIUM 8.5* 8.8* 9.3   LFT Recent Labs    10/29/20 0544 10/30/20 0518 10/31/20 0459  PROT 5.8* 5.8* 6.4*  ALBUMIN 1.5* 2.2* 3.4*  AST 110* 98* 89*  ALT 69* 61* 54*  ALKPHOS 169* 137* 116  BILITOT 6.7* 6.8* 6.5*   PT/INR Recent Labs    10/30/20 0518 10/31/20 0459   LABPROT 18.7* 20.0*  INR 1.6* 1.8*    Studies/Results: US LIVER DOPPLER  Result Date: 10/31/2020 CLINICAL DATA:  Hepatic cirrhosis EXAM: DUPLEX ULTRASOUND OF LIVER TECHNIQUE: Color and duplex Doppler ultrasound was performed to evaluate the hepatic in-flow and out-flow vessels. COMPARISON:  CT abdomen 10/27/2020 FINDINGS: Liver: Diffusely abnormal appearance of the liver. The hepatic parenchyma is heterogeneous and there is a nodular contour to the liver. An ill-defined hypoechoic mass measuring 5.7 x 2.9 x 4.1 cm is present in the left hepatic lobe. No intra or extrahepatic biliary ductal dilatation. Main Portal Vein size: 1.1 cm Portal Vein Velocities Main Prox:  0 cm/sec Main Mid: 0 cm/sec Main Dist:  0 cm/sec Right: 0 cm/sec Left: 0 cm/sec Hepatic Vein Velocities Right:  33 cm/sec Middle:  31 cm/sec Left:  53 cm/sec IVC: Present and patent with normal respiratory phasicity. Hepatic Artery Velocity:  119 cm/sec Splenic Vein Velocity:  36 cm/sec Spleen: 17.5 cm x 7 cm x 14.4 cm with a total volume of 920 cm^3 (411 cm^3 is upper limit normal) Portal Vein Occlusion/Thrombus: Yes Splenic Vein Occlusion/Thrombus: No Ascites: Present.  Mild perihepatic ascites. Varices: None IMPRESSION: 1. 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 The hepatic veins remain patent. 2. Cirrhosis with evidence of portal hypertension (marked splenomegaly and small volume ascites). 3.  Approximately 5.7 cm ill-defined mass in the posterior aspect of the left hepatic lobe concerning for hepatocellular carcinoma. 4. Hepatic cirrhosis. Signed, Criselda Peaches, MD, [facility] Electronically Signed   By: Jacqulynn Cadet M.D.   On: 10/31/2020 06:36    Assessment: 70 year old female with history of NASH cirrhosis (MELD 25 today, previously 6 in August 2021) and elevated AFP dating back to 08/2019, known hepatic lesions on MRI liver Dec 2021 with  concern for [facility] (AFP 219 in Nov 2021), presenting with decompensation and acute abdominal pain, vomiting, and diarrhea with hyponatremia, hypokalemia, elevated LFTs and total bilirubin. CT Liver 10/27/20 showed enlarging lesions in left hepatic lobe, probable portal vein tumor thrombus, progressive wall thickening suspicious for colitis. Doppler ultrasound yesterday with complete occlusion of the main and intrahepatic portal veins, hepatic veins remains patent.  It is unclear if this is blood clot only versus tumor burden.  Cirrhosis with [place] and portal vein thrombosis: Admitted with acute decompensation in the setting of [place] and also found to have portal vein occlusion, ? clot versus tumor thrombus.  MELD 25 today.  Tbili improved from admission, currently 6.5, down from 8.9 on admission. Transaminases improving. INR slightly increased today to 1.8 from 1.6.  No overt GI bleeding.  She admits to feeling foggy headed, but no overt confusion and no asterixis on exam. Her abdomen remains soft, nondistended, and nontender.  Chronic 1+ bilateral lower extremity edema maintaining on Lasix and spironolactone.  Also with significant hypoalbuminemia, receiving albumin this admission.  Notably, last EGD in September 2020 with portal hypertensive gastropathy, no varices at that time.  She has been scheduled to follow-up with Dr. Delton Coombes on 4/12.  Dr. Delton Coombes will manage anticoagulation if needed for portal vein thrombus, though it is not clear if this is a blood clot versus tumor.  If anticoagulation is needed, would recommend updating EGD prior to anticoagulation in the setting of acute decompensation to reevaluate for esophageal varices.  I am also checking an ammonia today due to feeling "foggy headed".   Abdominal pain, N/V/D: Resolved.   Colitis: Noted on CT. Reported diarrhea on admission, but this resolved. Now with loose to formed BMs. Doubt true colitis. Suspect CT findings may be more so related to  colopathy in the setting of end-stage liver disease and significant hypoalbuminemia.  If diarrhea returns, we can check stool studies.  Normocytic anemia: Hemoglobin 12.0 on admission, down to 8.7 today.   She has been receiving heparin since 4/1. No overt GI bleeding. BUN remains within normal limits. She has been receiving IV albumin every 8 hours since 4/3 which is likely contributing to decline in hemoglobin in the setting of hemodilution. We will continue to monitor for now.   Unilateral right arm edema: No tenderness.  Mild erythema noted at the wrist and above the elbow.  IV is in her right arm.  I have notified Dr. Dyann Kief who suspects IV infiltration. Will defer further evaluation to Dr. Dyann Kief.    Plan: 1.  Check ammonia today. 2.  Continue to follow CBC, LFTs, INR. 3.  Outpatient follow-up with Dr. Delton Coombes on 4/12. 4.  If anticoagulation is needed for portal vein thrombus, patient needs EGD prior. Will notify Dr. Delton Coombes.  5.  Continue PPI daily. 6.  Stool studies if recurrent diarrhea. 7.  Monitor for overt GI bleeding. 8.  Continue Lasix and spironolactone.  9. Continue supportive measures.    LOS: 4 days  10/31/2020, 10:58 AM   Aliene Altes, PA-C [facility]

## 2020-10-31 NOTE — Telephone Encounter (Signed)
Patient is currently in the hospital, but she will likely discharge soon, and we have signed off. She needs OV follow-up in 2-3 weeks. Previously followed by Dr. Laural Golden. Dx: Cirrhosis, HCC, anemia

## 2020-10-31 NOTE — Progress Notes (Signed)
PROGRESS NOTE    Taylor Williams  DJS:[id number] DOB: 1951-06-09 DOA: 10/26/2020 PCP: [name], MD   Chief Complaint  Patient presents with  . Dehydration    Brief admission narrative:  As per H&P written by Dr. Clearence Ped on 10/27/20  Taylor Williams  is a 70 y.o. 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  Patient reports that she has been on a hiatal hernia protocol, but 3 weeks ago she started having abdominal pain and vomiting that came on gradually and was getting progressively worse.  The abdominal pain feels like achy pain in her epigastrium and periumbilical region.  Does not radiate, its intermittent.  Nothing identified that makes it worse or better.  Decreased appetite associated.  She reports she started having diarrhea as well.  Patient denies any hematemesis or hematochezia or melena.  She reports because she was not feeling well she stopped taking all of her medications.  Patient reports that she had not really noticed that she was jaundiced.  Sister-in-law at bedside reports that she noticed the patient was jaundice on the 27th.  Patient does report that she has had a decrease in urine output but attributes that to decrease in p.o. intake.  She has been unsteady on her feet, with 2 falls in the last week, and fatigue.  She reports yesterday she was very somnolent and had a hard time getting up.  She has had generalized weakness as well.  One of her falls was 3 days ago included hitting her head.  She has had no changes in vision or hearing since then.  She has had no focal deficits.  Patient does report that her head still feels bruised.  Patient smokes a pack per day.  She does not drink alcohol.  She does not use illicit drugs.  She has been vaccinated for COVID.   Patient is DNR  In the ED Temp 97.9, heart rate 72, respiratory rate 17, blood pressure 130/54, satting at 98% White blood cell count 8.1, hemoglobin 12.0 Hyponatremic with a sodium of 124, hypokalemic with of potassium 3.2 Borderline hypoglycemic with a glucose of 73 UA is borderline urine culture pending Alk phos 201, albumin 1.8, lipase 47, bilirubin 8.9-up from 1 in July 2021 Patient did have an AFP recently but not during this visit that was 219 CT abdomen pelvis shows cirrhosis, portal vein hypertension, upper abdominal varices, new lesion within the lateral aspect of segment 3 of the left lobe, previous lesions mentioned are not seen again, nonemergent multiphase liver CT recommended, small volume ascites, mild infectious colitis cannot be excluded -given that patient has no white blood cell count, no fevers, colitis is thought to be lower on the differential Admission requested for inpatient consult with GI  Assessment & Plan: 1-hyponatremia/hypokalemia -In the setting of dehydration, poor oral intake and cirrhosis -Continue gentle hydration and follow electrolytes -Continue slowly advancing diet and assess tolerance -Continue as needed Zofran and Phenergan for refractory symptoms. -Sodium up to 130 corrected. -holding on IVF's now.  2-jaundice and abdominal pain -In the setting of hepatocellular carcinoma possibly invading portal vein -Following recommendations by gastroenterology service will get liver Doppler (currently pending). -Daily meld score labs -Continue as needed antiemetics outpatient follow-up with oncology service; in order to determine candidacy for treatment. -guarded prognosis -Bilirubin has trended down  some  3-transaminitis -Continue to follow LFTs; has trended down appropriately. -In the setting of portal hypertension, hepatocellular carcinoma and underlying decompensated cirrhosis. -liver Doppler demonstrating malignancy invading portal  veins.  4-decompensated cirrhosis -Continue minimizing hepatotoxic agents -Follow GI service recommendation -Continue Aldactone and Lasix.  5-type 2 diabetes mellitus -Continue to follow CBGs -Patient no eating much -Will continue to use sliding scale insulin if blood sugar above 200.  6-tobacco abuse -Cessation counseling provided -Continue nicotine patch  7-DNR -Patient expressed looking to treat the treatable but no wanting to pursue that invasive or artificial management. -Wishes will be respected -Follow clinical response.  8-hyperammonemia/confusion -Will follow ammonia level as recommended by GI services. -No prior history of lactulose use.  DVT prophylaxis: heparin Code Status: DNR Family Communication: No family at bedside.  Discussed with patient's brother and sister-in-law; apparently very poor living conditions with filthy house and signs of hoarding.  Patient has multiple month to use as no pain basic services appropriate taxes and essentially demonstrating lack of capacity to continue treatment by herself.  Mother is very concerned of her ongoing neglected living style and will like involvement of social worker to assist with safe discharge plans.  Disposition:   Status is: Inpatient  Dispo: The patient is from: Home              Anticipated d/c is to: Home              Patient currently not medically stable for discharge; still experiencing intermittent nausea, vomiting and abdominal discomfort.   Difficult to place patient no    Consultants:   GI   Procedures:  See below for x-ray reports.  Antimicrobials:  None   Subjective: No fever, no chest pain, no shortness of breath.  Slightly confused today as part of her insight and decision-making process.  Also having right upper extremity swelling  Objective: Vitals:   10/31/20 0540 10/31/20 0831 10/31/20 1014 10/31/20 1701  BP: (!) 104/51 (!) 114/44 (!) 105/48 (!) 128/56  Pulse: 74 73 78 74   Resp: 19 18  20   Temp: 97.6 F (36.4 C) (!) 97.4 F (36.3 C)  (!) 97.4 F (36.3 C)  TempSrc: Oral Oral  Oral  SpO2: 94% 97%  98%  Weight:      Height:       No intake or output data in the 24 hours ending 10/31/20 1713 Filed Weights   10/26/20 1918  Weight: 96.2 kg    Examination: General exam: Alert, awake, oriented x 3; slightly confused and repeating statements during interview today.  Expressed feeling foggy mentation wise.  No nausea or vomiting. Respiratory system: Good air movement bilaterally, normal respiratory effort.  No using accessory muscle. Cardiovascular system:RRR. No murmurs, rubs, gallops.  No JVD. Gastrointestinal system: Abdomen is nondistended, soft and nontender. No organomegaly or masses felt. Normal bowel sounds heard. Central nervous system: No focal neurological deficits. Extremities: No cyanosis or clubbing; trace edema appreciated in her lower extremities bilaterally.  Right upper extremity swelling appreciated; patient had infiltrated IV line on this arm. Skin: No rashes, lesions or ulcers Psychiatry: Mood & affect appropriate.    Data Reviewed: I have personally reviewed following labs and imaging studies  CBC: Recent Labs  Lab 10/26/20 2056 10/27/20 0414 10/30/20 0518 10/31/20 0459  WBC 8.1 5.6 3.3* 2.8*  NEUTROABS 5.7 3.2  --   --   HGB 12.0 10.8* 10.1* 8.7*  HCT 33.9* 30.7* 28.5* 24.6*  MCV 85.0 83.7 85.6 85.1  PLT 159 143* 105* 79*    Basic Metabolic Panel: Recent Labs  Lab 10/26/20 2056 10/27/20 0414 10/29/20 0544 10/30/20 0518 10/31/20 0459  NA 124* 125* 123* 126* 129*  K 3.2* 3.3* 4.3 4.1 4.0  CL 92* 95* 94* 96* 97*  CO2 22 21* 22 21* 23  GLUCOSE 73 40* 154* 102* 140*  BUN 17 15 18 16 14   CREATININE 0.97 0.71 0.62 0.57 0.47  CALCIUM 8.8* 8.3* 8.5* 8.8* 9.3  MG  --  1.7  --   --   --     GFR: Estimated Creatinine Clearance: 79.3 mL/min (by C-G formula based on SCr of 0.47 mg/dL).  Liver Function Tests: Recent  Labs  Lab 10/26/20 2056 10/27/20 0414 10/27/20 1111 10/29/20 0544 10/30/20 0518 10/31/20 0459  AST 136* 117*  --  110* 98* 89*  ALT 78* 66*  --  69* 61* 54*  ALKPHOS 201* 167*  --  169* 137* 116  BILITOT 8.9* 7.1* 7.7* 6.7* 6.8* 6.5*  PROT 6.9 5.9*  --  5.8* 5.8* 6.4*  ALBUMIN 1.8* 1.5*  --  1.5* 2.2* 3.4*    CBG: Recent Labs  Lab 10/27/20 0729 10/27/20 0800  GLUCAP 52* 75    Recent Results (from the past 240 hour(s))  SARS CORONAVIRUS 2 (TAT 6-24 HRS) Nasopharyngeal Nasopharyngeal Swab     Status: None   Collection Time: 10/27/20 12:46 AM   Specimen: Nasopharyngeal Swab  Result Value Ref Range Status   SARS Coronavirus 2 NEGATIVE NEGATIVE Final    Comment: (NOTE) SARS-CoV-2 target nucleic acids are NOT DETECTED.  The SARS-CoV-2 RNA is generally detectable in upper and lower respiratory specimens during the acute phase of infection. Negative results do not preclude SARS-CoV-2 infection, do not rule out co-infections with other pathogens, and should not be used as the sole basis for treatment or other patient management decisions. Negative results must be combined with clinical observations, patient history, and epidemiological information. The expected result is Negative.  Fact Sheet for Patients: SugarRoll.be  Fact Sheet for Healthcare Providers: https://www.woods-mathews.com/  This test is not yet approved or cleared by the [place] FDA and  has been authorized for detection and/or diagnosis of SARS-CoV-2 by FDA under an Emergency Use Authorization (EUA). This EUA will remain  in effect (meaning this test can be used) for the duration of the COVID-19 declaration under Se ction 564(b)(1) of the Act, 21 U.S.C. section 360bbb-3(b)(1), unless the authorization is terminated or revoked sooner.  Performed at [facility], [place] [address], [place], [place] [address]      Radiology Studies: US Venous Img Upper  Uni Right(DVT)  Result Date: 10/31/2020 CLINICAL DATA:  Right upper extremity edema. EXAM: RIGHT UPPER EXTREMITY VENOUS DOPPLER ULTRASOUND TECHNIQUE: Gray-scale sonography with graded compression, as well as color Doppler and duplex ultrasound were performed to evaluate the upper extremity deep venous system from the level of the subclavian vein and including the jugular, axillary, basilic, radial, ulnar and upper cephalic vein. Spectral Doppler was utilized to evaluate flow at rest and with distal augmentation maneuvers. COMPARISON:  None. FINDINGS: Contralateral Subclavian Vein: Respiratory phasicity is normal and symmetric with the symptomatic side. No evidence of thrombus. Normal compressibility. Internal Jugular Vein: No evidence of thrombus. Normal compressibility, respiratory phasicity and response to augmentation. Subclavian Vein: No evidence of thrombus. Normal compressibility, respiratory phasicity and response to augmentation. Axillary Vein: No evidence of thrombus. Normal compressibility, respiratory phasicity and response to augmentation. Cephalic Vein: No evidence of  thrombus. Normal compressibility, respiratory phasicity and response to augmentation. Basilic Vein: No evidence of thrombus. Normal compressibility, respiratory phasicity and response to augmentation. Brachial Veins: No evidence of thrombus. Normal compressibility, respiratory phasicity and response to augmentation. Radial Veins: No evidence of thrombus. Normal compressibility, respiratory phasicity and response to augmentation. Ulnar Veins: No evidence of thrombus. Normal compressibility, respiratory phasicity and response to augmentation. Venous Reflux:  None visualized. Other Findings: No evidence of superficial thrombophlebitis or abnormal fluid collection. IMPRESSION: No evidence of DVT within the right upper extremity. Electronically Signed   By: Aletta Edouard M.D.   On: 10/31/2020 17:09   US LIVER DOPPLER  Result Date:  10/31/2020 CLINICAL DATA:  Hepatic cirrhosis EXAM: DUPLEX ULTRASOUND OF LIVER TECHNIQUE: Color and duplex Doppler ultrasound was performed to evaluate the hepatic in-flow and out-flow vessels. COMPARISON:  CT abdomen 10/27/2020 FINDINGS: Liver: Diffusely abnormal appearance of the liver. The hepatic parenchyma is heterogeneous and there is a nodular contour to the liver. An ill-defined hypoechoic mass measuring 5.7 x 2.9 x 4.1 cm is present in the left hepatic lobe. No intra or extrahepatic biliary ductal dilatation. Main Portal Vein size: 1.1 cm Portal Vein Velocities Main Prox:  0 cm/sec Main Mid: 0 cm/sec Main Dist:  0 cm/sec Right: 0 cm/sec Left: 0 cm/sec Hepatic Vein Velocities Right:  33 cm/sec Middle:  31 cm/sec Left:  53 cm/sec IVC: Present and patent with normal respiratory phasicity. Hepatic Artery Velocity:  119 cm/sec Splenic Vein Velocity:  36 cm/sec Spleen: 17.5 cm x 7 cm x 14.4 cm with a total volume of 920 cm^3 (411 cm^3 is upper limit normal) Portal Vein Occlusion/Thrombus: Yes Splenic Vein Occlusion/Thrombus: No Ascites: Present.  Mild perihepatic ascites. Varices: None IMPRESSION: 1. 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 The hepatic veins remain patent. 2. Cirrhosis with evidence of portal hypertension (marked splenomegaly and small volume ascites). 3. Approximately 5.7 cm ill-defined mass in the posterior aspect of the left hepatic lobe concerning for hepatocellular carcinoma. 4. Hepatic cirrhosis. Signed, Criselda Peaches, MD, [facility]iology Electronically Signed   By: Jacqulynn Cadet M.D.   On: 10/31/2020 06:36   Scheduled Meds: . (feeding supplement) PROSource Plus  30 mL Oral BID BM  . ALPRAZolam  0.25 mg Oral BID  . Chlorhexidine Gluconate Cloth  6 each Topical Daily  . citalopram  20 mg Oral Daily  . feeding supplement  237 mL Oral BID BM  . furosemide  20 mg Oral Daily  . heparin  5,000 Units  Subcutaneous Q8H  . lisinopril  5 mg Oral Daily  . nicotine  21 mg Transdermal Daily  . pantoprazole  40 mg Oral QAC breakfast  . spironolactone  25 mg Oral Daily  . venlafaxine XR  75 mg Oral BID   Continuous Infusions: . promethazine (PHENERGAN) injection       LOS: 4 days    Time spent: 30 minutes   Barton Dubois, MD [facility]   To contact the attending provider between 7A-7P or the covering provider during after hours 7P-7A, please log into the web site [email or web address] and access using universal [place] password for that web site. If you do not have the password, please call the hospital operator.  10/31/2020, 5:13 PM

## 2020-10-31 NOTE — Care Management Important Message (Signed)
Important Message  Patient Details  Name: Taylor Williams MRN: [id number] Date of Birth: 12-03-1950   Medicare Important Message Given:  Yes     Tommy Medal 10/31/2020, 4:24 PM

## 2020-10-31 NOTE — Progress Notes (Signed)
I met with the patient today at her inpatient bedside. I introduced myself and explained my role in the patient's care. I discussed patient's upcoming appointment with Dr. Delton Coombes scheduled for 11/07/2020 at 1330. Patient verbalized understanding and reports that she is familiar with the [place]. I provided my contact information and encouraged the patient to call with questions or concerns.

## 2020-10-31 NOTE — TOC Progression Note (Signed)
Transition of Care [facility]) - Progression Note    Patient Details  Name: Taylor Williams MRN: [id number] Date of Birth: 03/05/1951  Transition of Care [facility]) CM/SW Contact  Boneta Lucks, RN Phone Number: 10/31/2020, 3:24 PM  Clinical Narrative:   DC plan for today. New referral to [facility]. Md to order RN/PT. Vaughan Basta updated   Expected Discharge Plan: [place] Barriers to Discharge: Continued Medical Work up  Expected Discharge Plan and Services Expected Discharge Plan: [place]: RN,PT [facility] Agency: [place] ([facility]) Date [facility] Agency Contacted: 10/30/20 Time [place]: 1200 Representative spoke with at [place]: Romualdo Bolk  Readmission Risk Interventions Readmission Risk Prevention Plan 10/31/2020  Transportation Screening Complete  Home Care Screening Complete  Medication Review (RN CM) Complete  Some recent data might be hidden

## 2020-11-01 LAB — COMPREHENSIVE METABOLIC PANEL
ALT: 59 U/L — ABNORMAL HIGH (ref 0–44)
AST: 94 U/L — ABNORMAL HIGH (ref 15–41)
Albumin: 3.5 g/dL (ref 3.5–5.0)
Alkaline Phosphatase: 115 U/L (ref 38–126)
Anion gap: 9 (ref 5–15)
BUN: 13 mg/dL (ref 8–23)
CO2: 23 mmol/L (ref 22–32)
Calcium: 9.5 mg/dL (ref 8.9–10.3)
Chloride: 98 mmol/L (ref 98–111)
Creatinine, Ser: 0.52 mg/dL (ref 0.44–1.00)
GFR, Estimated: >60 mL/min (ref >60)
Glucose, Bld: 131 mg/dL — ABNORMAL HIGH (ref 70–99)
Potassium: 4.4 mmol/L (ref 3.5–5.1)
Sodium: 130 mmol/L — ABNORMAL LOW (ref 135–145)
Total Bilirubin: 7.6 mg/dL — ABNORMAL HIGH (ref 0.3–1.2)
Total Protein: 6.4 g/dL — ABNORMAL LOW (ref 6.5–8.1)

## 2020-11-01 LAB — CBC
HCT: 26.8 % — ABNORMAL LOW (ref 36.0–46.0)
Hemoglobin: 9.6 g/dL — ABNORMAL LOW (ref 12.0–15.0)
MCH: 30.7 pg (ref 26.0–34.0)
MCHC: 35.8 g/dL (ref 30.0–36.0)
MCV: 85.6 fL (ref 80.0–100.0)
Platelets: 91 10*3/uL — ABNORMAL LOW (ref 150–400)
RBC: 3.13 MIL/uL — ABNORMAL LOW (ref 3.87–5.11)
RDW: 21.3 % — ABNORMAL HIGH (ref 11.5–15.5)
WBC: 5.2 10*3/uL (ref 4.0–10.5)
nRBC: 0 % (ref 0.0–0.2)

## 2020-11-01 LAB — AMMONIA: Ammonia: 42 umol/L — ABNORMAL HIGH (ref 9–35)

## 2020-11-01 LAB — PROTIME-INR
INR: 1.9 — ABNORMAL HIGH (ref 0.8–1.2)
Prothrombin Time: 21.1 seconds — ABNORMAL HIGH (ref 11.4–15.2)

## 2020-11-01 MED ORDER — PANTOPRAZOLE SODIUM 40 MG PO TBEC: 40.0000 mg | DELAYED_RELEASE_TABLET | Freq: Every day | ORAL | 1 refills | Status: AC

## 2020-11-01 MED ORDER — ALPRAZOLAM 0.25 MG PO TABS: 0.2500 mg | ORAL_TABLET | Freq: Two times a day (BID) | ORAL | 0 refills | Status: AC

## 2020-11-01 MED ORDER — ENSURE ENLIVE PO LIQD: 237.0000 mL | Freq: Two times a day (BID) | ORAL | 12 refills | Status: DC

## 2020-11-01 MED ORDER — SODIUM CHLORIDE 0.9 % IV SOLN
6.2500 mg | Freq: Three times a day (TID) | INTRAVENOUS | Status: DC | PRN
  Filled 2020-11-01: qty 0.25

## 2020-11-01 MED ORDER — LACTULOSE 10 GM/15ML PO SOLN
10.0000 g | Freq: Three times a day (TID) | ORAL | Status: DC
  Administered 2020-11-01: 10 g via ORAL
  Filled 2020-11-01: qty 30

## 2020-11-01 NOTE — TOC Progression Note (Addendum)
RE: Taylor Williams DOB: Oct 03, 1950 Date: 11/01/2020   MUST ID: [id number]  To Whom It May Concern:   Please be advised that the above name patient will require a short-term nursing home stay -- anticipated 30 days or less rehabilitation and strengthening. The plan is for return home.

## 2020-11-01 NOTE — TOC Progression Note (Signed)
Transition of Care [facility]) - Progression Note    Patient Details  Name: Taylor Williams MRN: [id number] Date of Birth: 02-05-1951  Transition of Care [facility]) CM/SW Contact  Shade Flood, LCSW Phone Number: 11/01/2020, 4:07 PM  Clinical Narrative:     TOC following. Spoke with pt and family again regarding dc planning. Pt now agreeable to SNF rehab. Discussed provider options and referred as requested. PASSR started. Will follow up in AM.  Expected Discharge Plan: [place] Barriers to Discharge: Barriers Resolved  Expected Discharge Plan and Services Expected Discharge Plan: [place]         Expected Discharge Date: 11/01/20               DME Arranged: N/A DME Agency: NA       HH Arranged: RN,PT [place] Agency: [place] ([place]) Date [place]: 10/30/20 Time [place]: 1200 Representative spoke with at [place]: [place] ([place]) Interventions    Readmission Risk Interventions Readmission Risk Prevention Plan 10/31/2020  Transportation Screening Complete  Home Care Screening Complete  Medication Review (RN CM) Complete  Some recent data might be hidden

## 2020-11-01 NOTE — TOC Transition Note (Signed)
Transition of Care [facility]) - CM/SW Discharge Note   Patient Details  Name: Taylor Williams MRN: [id number] Date of Birth: 1951/04/15  Transition of Care [facility]) CM/SW Contact:  Salome Arnt, LCSW Phone Number: 11/01/2020, 10:28 AM   Clinical Narrative:  Pt d/c today and will return home with home health PT/RN through [facility]. Linda with [facility] notified of d/c. Per MD, pt's brother would like pt to go to facility. LCSW discussed with pt who refuses and states she will tell her brother that she is not interested. MD updated.     Final next level of care: [place] Barriers to Discharge: Barriers Resolved   Patient Goals and CMS Choice Patient states their goals for this hospitalization and ongoing recovery are:: to go home, CMS Medicare.gov Compare Post Acute Care list provided to:: Patient Choice offered to / list presented to : Patient  Discharge Placement                    Patient and family notified of of transfer: 11/01/20  Discharge Plan and Services                DME Arranged: N/A DME Agency: NA       HH Arranged: RN,PT [place] Agency: [place] ([place]) Date [place]: 10/30/20 Time [place]: 1200 Representative spoke with at [place]: [place] ([place]) Interventions     Readmission Risk Interventions Readmission Risk Prevention Plan 10/31/2020  Transportation Screening Complete  Home Care Screening Complete  Medication Review (RN CM) Complete  Some recent data might be hidden

## 2020-11-01 NOTE — NC FL2 (Signed)
[place] LEVEL OF CARE SCREENING TOOL     IDENTIFICATION  Patient Name: Taylor Williams Birthdate: Dec 17, 1950 Sex: female Admission Date (Current Location): 10/26/2020  [facility] and [place] Number:  [employer] and Address:  [place] [address], [place]      Provider Number: [id number]  Attending Physician Name and Address:  Rodena Goldmann, DO  Relative Name and Phone Number:  Rhodes,Kitty (Sister)   [phone]    Current Level of Care: Hospital Recommended Level of Care:   Prior Approval Number:    Date Approved/Denied:   PASRR Number:    Discharge Plan: SNF    Current Diagnoses: Patient Active Problem List   Diagnosis Date Noted  . Malignant neoplasm of liver ([place])   . Jaundice 10/27/2020  . Hyponatremia 10/27/2020  . Hypokalemia 10/27/2020  . Transaminitis 10/27/2020  . Severe protein-calorie malnutrition ([place]) 10/27/2020  . DMII (diabetes mellitus, type 2) ([place]) 10/27/2020  . Tobacco abuse 10/27/2020  . Pain of upper abdomen   . Anemia   . Bilateral leg edema 03/07/2020  . Cirrhosis, non-alcoholic ([place]) 47/82/9562  . Hepatic cirrhosis ([place]) 02/23/2019  . Varicose veins of bilateral lower extremities with other complications 13/02/6577  . Solitary pulmonary nodule 07/25/2016  . High blood sugar 02/02/2016  . Diarrhea 01/19/2016  . Carcinoma of upper-outer quadrant of left female breast ([place]) 01/01/2016    Orientation RESPIRATION BLADDER Height & Weight     Self,Place,Situation  Normal Continent Weight: 212 lb (96.2 kg) Height:  5' 8"  (172.7 cm)  BEHAVIORAL SYMPTOMS/MOOD NEUROLOGICAL BOWEL NUTRITION STATUS      Continent Diet (heart healthy/carb modified)  AMBULATORY STATUS COMMUNICATION OF NEEDS Skin   Supervision Verbally Normal                       Personal Care Assistance Level of Assistance  Bathing,Feeding,Dressing Bathing Assistance: Limited assistance Feeding assistance:  Independent Dressing Assistance: Limited assistance     Functional Limitations Info  Sight,Hearing,Speech Sight Info: Adequate Hearing Info: Adequate Speech Info: Adequate    SPECIAL CARE FACTORS FREQUENCY  PT (By licensed PT)     PT Frequency: 5x/week              Contractures Contractures Info: Not present    Additional Factors Info  Code Status,Psychotropic,Allergies Code Status Info: DNR Allergies Info: Aspirin, other, fish allergy, procaine Psychotropic Info: xanax, celexa, effexor xr         Current Medications (11/01/2020):  This is the current hospital active medication list Current Facility-Administered Medications  Medication Dose Route Frequency Provider Last Rate Last Admin  . (feeding supplement) PROSource Plus liquid 30 mL  30 mL Oral BID BM Barton Dubois, MD   30 mL at 11/01/20 0915  . ALPRAZolam Duanne Moron) tablet 0.25 mg  0.25 mg Oral BID Zierle-Ghosh, Asia B, DO   0.25 mg at 11/01/20 0915  . Chlorhexidine Gluconate Cloth 2 % PADS 6 each  6 each Topical Daily Barton Dubois, MD   6 each at 10/31/20 [phone]  . citalopram (CELEXA) tablet 20 mg  20 mg Oral Daily Zierle-Ghosh, Asia B, DO   20 mg at 11/01/20 0914  . feeding supplement (ENSURE ENLIVE / ENSURE PLUS) liquid 237 mL  237 mL Oral BID BM Zierle-Ghosh, Asia B, DO   237 mL at 10/31/20 1057  . furosemide (LASIX) tablet 20 mg  20 mg Oral Daily Zierle-Ghosh, Asia B, DO   20 mg  at 11/01/20 0914  . heparin injection 5,000 Units  5,000 Units Subcutaneous Q8H Zierle-Ghosh, Asia B, DO   5,000 Units at 11/01/20 0546  . ibuprofen (ADVIL) tablet 400 mg  400 mg Oral Q8H PRN Barton Dubois, MD      . lisinopril (ZESTRIL) tablet 5 mg  5 mg Oral Daily Zierle-Ghosh, Asia B, DO   5 mg at 11/01/20 0915  . nicotine (NICODERM CQ - dosed in mg/24 hours) patch 21 mg  21 mg Transdermal Daily Zierle-Ghosh, Asia B, DO   21 mg at 11/01/20 0914  . ondansetron (ZOFRAN) tablet 4 mg  4 mg Oral Q6H PRN Zierle-Ghosh, Asia B, DO       Or  .  ondansetron (ZOFRAN) injection 4 mg  4 mg Intravenous Q6H PRN Zierle-Ghosh, Asia B, DO   4 mg at 10/29/20 0211  . oxyCODONE (Oxy IR/ROXICODONE) immediate release tablet 5 mg  5 mg Oral Q4H PRN Zierle-Ghosh, Asia B, DO      . pantoprazole (PROTONIX) EC tablet 40 mg  40 mg Oral QAC breakfast Erenest Rasher, PA-C   40 mg at 11/01/20 0914  . promethazine (PHENERGAN) 6.25 mg in sodium chloride 0.9 % 50 mL IVPB  6.25 mg Intravenous Q8H PRN Manuella Ghazi, Pratik D, DO      . spironolactone (ALDACTONE) tablet 25 mg  25 mg Oral Daily Zierle-Ghosh, Asia B, DO   25 mg at 11/01/20 0915  . venlafaxine XR (EFFEXOR-XR) 24 hr capsule 75 mg  75 mg Oral BID Zierle-Ghosh, Asia B, DO   75 mg at 11/01/20 0915     Discharge Medications: Please see discharge summary for a list of discharge medications.  Relevant Imaging Results:  Relevant Lab Results:   Additional Information SSN [id number] Clydene Pugh, LCSW

## 2020-11-01 NOTE — Discharge Summary (Addendum)
Physician Discharge Summary  Taylor Williams OZH:[id number] DOB: Nov 06, 1950 DOA: 10/26/2020  PCP: [name], MD  Admit date: 10/26/2020  Discharge date: 11/02/2020  Admitted From:Home  Disposition:  Home  Recommendations for Outpatient Follow-up:  1. Follow up with PCP in 1-2 weeks 2. Continue on medications as noted below 3. Follow-up with Dr. Delton Coombes 4/12 at 1:30 PM as scheduled to consider treatment options 4. Follow-up with GI 4/19 at 11:15 AM with Dr. 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  She was also noted to be jaundiced and had decreased appetite.  She was admitted for further evaluation of jaundice and abdominal pain with associated electrolyte abnormalities of hyponatremia and hypokalemia.  She was noted to have obstruction of her portal vein related to her hepatocellular carcinoma and had some decompensated cirrhosis for which the gastroenterology service had recommended continuation of diuretics.  She is overall noted to have very guarded prognosis and will require outpatient follow-up with oncology as scheduled to consider treatment options with likely chemotherapy.  Her sodium levels remain chronically low, but she is in stable condition for discharge with minimal LFT elevation as well as ammonia elevation.  She is not currently confused and will have close follow-up as noted above.  Discharge Diagnoses:  Principal Problem:   Jaundice Active Problems:   Hyponatremia   Hypokalemia   Transaminitis   Severe  protein-calorie malnutrition (HCC)   DMII (diabetes mellitus, type 2) (HCC)   Tobacco abuse   Pain of upper abdomen   Anemia   Malignant neoplasm of liver (HCC)  Principal discharge diagnosis: Jaundice and abdominal pain in the setting of hepatocellular carcinoma with invasion of portal vein.  Discharge Instructions  Discharge Instructions    Diet - low sodium heart healthy   Complete by: As directed    Increase activity slowly   Complete by: As directed      Allergies as of 11/02/2020      Reactions   Aspirin Swelling   Facial swelling and panic attack when she took pain medication that contained aspirin   Other Swelling   novacaine - patient states reaction happened as a child and she believes it caused facial swelling, mother always told her the MD said never take again   Fish Allergy Other (See Comments)   gagging   Procaine       Medication List    TAKE these medications   ALPRAZolam 0.25 MG tablet Commonly known as: XANAX Take 1 tablet (0.25 mg total) by mouth 2 (two) times daily.   cetirizine 10 MG tablet Commonly known as: ZYRTEC Take 10 mg by mouth at bedtime.   citalopram 20 MG tablet Commonly known as: CELEXA Take 20 mg by mouth daily.   dapagliflozin propanediol 5 MG Tabs tablet Commonly known as: FARXIGA Take 5 mg by mouth daily.   doxycycline 100 MG tablet Commonly known as: VIBRA-TABS Take 1 tablet (100 mg total) by mouth every 12 (twelve) hours for 7 days.   doxycycline 50 MG capsule Commonly known as: MONODOX Take 50 mg by mouth daily.   ergocalciferol 1.25 MG (50000 UT) capsule  Commonly known as: VITAMIN D2 Take 1 capsule (50,000 Units total) by mouth once a week.   feeding supplement Liqd Take 237 mLs by mouth 2 (two) times daily between meals.   furosemide 20 MG tablet Commonly known as: LASIX Take 1 tablet (20 mg total) by mouth daily.   glipiZIDE 10 MG tablet Commonly known as: GLUCOTROL Take 10 mg by mouth 2 (two) times daily  before a meal.   hydrocortisone 2.5 % cream Apply 1 application topically at bedtime.   insulin glargine 100 UNIT/ML injection Commonly known as: LANTUS Inject 60 Units into the skin at bedtime.   ketoconazole 2 % cream Commonly known as: NIZORAL Apply 1 application topically daily.   Krill Oil 500 MG Caps Take 1,500 mg by mouth 2 (two) times daily.   lisinopril 5 MG tablet Commonly known as: ZESTRIL Take 5 mg by mouth daily.   metFORMIN 1000 MG tablet Commonly known as: GLUCOPHAGE Take 1,000 mg by mouth 2 (two) times daily with a meal.   pantoprazole 40 MG tablet Commonly known as: PROTONIX Take 1 tablet (40 mg total) by mouth daily before breakfast.   potassium chloride SA 20 MEQ tablet Commonly known as: KLOR-CON Take 1 tablet (20 mEq total) by mouth daily.   sitaGLIPtin 100 MG tablet Commonly known as: JANUVIA Take 100 mg by mouth daily.   spironolactone 25 MG tablet Commonly known as: Aldactone Take 1 tablet (25 mg total) by mouth daily.   venlafaxine XR 75 MG 24 hr capsule Commonly known as: EFFEXOR-XR Take 75 mg by mouth 2 (two) times daily.            Durable Medical Equipment  (From admission, onward)         Start     Ordered   11/01/20 1301  For home use only DME Walker rolling  Once       Question Answer Comment  Walker: With 5 Inch Wheels   Patient needs a walker to treat with the following condition Weakness      11/01/20 1300          Follow-up Information    Hasanaj, Samul Dada, MD. Schedule an appointment as soon as possible for a visit in 1 week(s).   Specialty: Internal Medicine Contact information: [place] [id number] [phone]        [facility].   Specialty: Home Health Services Why: Will contact you to schedule home health visits.        Rogene Houston, MD Follow up on 11/14/2020.   Specialty: Gastroenterology Contact information: [place], [address] [place] [address] [phone]        Derek Jack, MD Follow up on 11/07/2020.   Specialty: Hematology Contact information: [place] [address] [phone]              Allergies  Allergen Reactions  . Aspirin Swelling    Facial swelling and panic attack when she took pain medication that contained aspirin  . Other Swelling    novacaine - patient states reaction happened as a child and she believes it caused facial swelling, mother always told her the MD said never take again  . Fish Allergy Other (See Comments)    gagging  . Procaine     Consultations:  GI   Procedures/Studies: CT ABDOMEN PELVIS W CONTRAST  Result Date: 10/26/2020 CLINICAL DATA:  Dehydration, jaundice, weakness, fatigue, history  of cirrhosis and breast cancer EXAM: CT ABDOMEN AND PELVIS WITH CONTRAST TECHNIQUE: Multidetector CT imaging of the abdomen and pelvis was performed using the standard protocol following bolus administration of intravenous contrast. CONTRAST:  136m OMNIPAQUE IOHEXOL 300 MG/ML  SOLN COMPARISON:  07/04/2020, 01/28/2020 FINDINGS: Lower chest: Partially visualized right middle lobe nodule on image 1 measuring 8 mm, corresponding to calcified nodule on previous chest CT. Remaining portions of the lung bases are clear. Hepatobiliary: Diffuse changes of cirrhosis are again noted. The left lobe liver lesion seen on previous MRI are not well visualized on this single phase CT. There is a new hypodense lesion within the lateral aspect left lobe liver segment 3, measuring 3.2 cm image 30/2. Given previous MRI findings, this is concerning for new hepatocellular carcinoma. As per previous recommendation, nonemergent dedicated multiphase liver CT is recommended for further evaluation, if not performed in the interim. No intrahepatic duct dilation.  The gallbladder surgically absent. Pancreas: Unremarkable. No pancreatic ductal dilatation or surrounding inflammatory  changes. Spleen: Borderline splenomegaly measuring 12.8 cm in craniocaudal length. No focal abnormalities. Adrenals/Urinary Tract: Punctate less than 2 mm nonobstructing left renal calculus. No right-sided calculi. No obstructive uropathy within either kidney. Bladder is grossly normal. The left adrenal is not well visualized. Nodular thickening of the right adrenal gland is unchanged, nonspecific. Stomach/Bowel: No bowel obstruction or ileus. Scattered colonic diverticulosis without diverticulitis. There is mild diffuse colonic wall thickening, nonspecific given abdominal ascites and hypoproteinemic state related to cirrhosis. Inflammatory or infectious colitis cannot be excluded. There is a normal appendix in the right lower quadrant. Vascular/Lymphatic: Portal vein, splenic vein, and SMV are patent. Sequela of portal venous hypertension is noted, with marked splenic and gastric varices. Diffuse atherosclerosis throughout the aorta and its branches. No pathologic adenopathy. Reproductive: Uterus and bilateral adnexa are unremarkable. Other: Small volume ascites throughout the abdomen and pelvis, greatest surrounding the liver. No free intraperitoneal gas. No abdominal wall hernia. There is mild diffuse body wall edema. Musculoskeletal: No acute or destructive bony lesions. Reconstructed images demonstrate no additional findings. IMPRESSION: 1. Cirrhosis, with portal venous hypertension manifested by extensive upper abdominal varices and borderline splenomegaly. 2. New lesion within the lateral aspect of segment 3 left lobe liver, concerning for hepatocellular carcinoma. The remaining liver lesions seen within the left lobe on prior MRI are not well visualized on this single phase CT exam. Per previous MRI recommendation, nonemergent multiphase liver CT is again recommended if not performed in the interim. 3. Small volume ascites. 4. Mild diffuse colonic wall thickening, nonspecific given underlying liver disease  and ascites. Mild inflammatory or infectious colitis cannot be excluded. 5.  Aortic Atherosclerosis (ICD10-I70.0). Electronically Signed   By: MRanda NgoM.D.   On: 10/26/2020 23:54   [place]Venous Img Upper Uni Right(DVT)  Result Date: 10/31/2020 CLINICAL DATA:  Right upper extremity edema. EXAM: RIGHT UPPER EXTREMITY VENOUS DOPPLER ULTRASOUND TECHNIQUE: Gray-scale sonography with graded compression, as well as color Doppler and duplex ultrasound were performed to evaluate the upper extremity deep venous system from the level of the subclavian vein and including the jugular, axillary, basilic, radial, ulnar and upper cephalic vein. Spectral Doppler was utilized to evaluate flow at rest and with distal augmentation maneuvers. COMPARISON:  None. FINDINGS: Contralateral Subclavian Vein: Respiratory phasicity is normal and symmetric with the symptomatic side. No evidence of thrombus. Normal compressibility. Internal Jugular Vein: No evidence of thrombus. Normal compressibility, respiratory phasicity and response to augmentation. Subclavian Vein: No evidence of thrombus. Normal compressibility, respiratory  phasicity and response to augmentation. Axillary Vein: No evidence of thrombus. Normal compressibility, respiratory phasicity and response to augmentation. Cephalic Vein: No evidence of thrombus. Normal compressibility, respiratory phasicity and response to augmentation. Basilic Vein: No evidence of thrombus. Normal compressibility, respiratory phasicity and response to augmentation. Brachial Veins: No evidence of thrombus. Normal compressibility, respiratory phasicity and response to augmentation. Radial Veins: No evidence of thrombus. Normal compressibility, respiratory phasicity and response to augmentation. Ulnar Veins: No evidence of thrombus. Normal compressibility, respiratory phasicity and response to augmentation. Venous Reflux:  None visualized. Other Findings: No evidence of superficial thrombophlebitis  or abnormal fluid collection. IMPRESSION: No evidence of DVT within the right upper extremity. Electronically Signed   By: Aletta Edouard M.D.   On: 10/31/2020 17:09   DG CHEST PORT 1 VIEW  Result Date: 10/27/2020 CLINICAL DATA:  70 year old female with history of cough. EXAM: PORTABLE CHEST - 1 VIEW COMPARISON:  Chest CT from 01/28/2020 FINDINGS: The mediastinal contours are within normal limits. No cardiomegaly. The lungs are clear bilaterally without evidence of focal consolidation, pleural effusion, or pneumothorax. The known pulmonary nodules visualized on comparison chest CT are not radiographically apparent. Surgical clips in the left axilla. No acute osseous abnormality. IMPRESSION: No acute cardiopulmonary process. Electronically Signed   By: Ruthann Cancer MD   On: 10/27/2020 16:12   DG Toe Great Left  Result Date: 10/27/2020 CLINICAL DATA:  Redness and swelling, initial encounter EXAM: LEFT GREAT TOE COMPARISON:  None. FINDINGS: There is no evidence of fracture or dislocation. There is no evidence of arthropathy or other focal bone abnormality. Soft tissues are unremarkable. IMPRESSION: No acute abnormality noted. Electronically Signed   By: Inez Catalina M.D.   On: 10/27/2020 02:48   DG Toe Great Right  Result Date: 10/27/2020 CLINICAL DATA:  Redness and swelling, initial encounter EXAM: RIGHT GREAT TOE COMPARISON:  None. FINDINGS: There is no evidence of fracture or dislocation. There is no evidence of arthropathy or other focal bone abnormality. Soft tissues are unremarkable. IMPRESSION: No acute abnormality noted. Electronically Signed   By: Inez Catalina M.D.   On: 10/27/2020 02:48   US LIVER DOPPLER  Result Date: 10/31/2020 CLINICAL DATA:  Hepatic cirrhosis EXAM: DUPLEX ULTRASOUND OF LIVER TECHNIQUE: Color and duplex Doppler ultrasound was performed to evaluate the hepatic in-flow and out-flow vessels. COMPARISON:  CT abdomen 10/27/2020 FINDINGS: Liver: Diffusely abnormal appearance of the  liver. The hepatic parenchyma is heterogeneous and there is a nodular contour to the liver. An ill-defined hypoechoic mass measuring 5.7 x 2.9 x 4.1 cm is present in the left hepatic lobe. No intra or extrahepatic biliary ductal dilatation. Main Portal Vein size: 1.1 cm Portal Vein Velocities Main Prox:  0 cm/sec Main Mid: 0 cm/sec Main Dist:  0 cm/sec Right: 0 cm/sec Left: 0 cm/sec Hepatic Vein Velocities Right:  33 cm/sec Middle:  31 cm/sec Left:  53 cm/sec IVC: Present and patent with normal respiratory phasicity. Hepatic Artery Velocity:  119 cm/sec Splenic Vein Velocity:  36 cm/sec Spleen: 17.5 cm x 7 cm x 14.4 cm with a total volume of 920 cm^3 (411 cm^3 is upper limit normal) Portal Vein Occlusion/Thrombus: Yes Splenic Vein Occlusion/Thrombus: No Ascites: Present.  Mild perihepatic ascites. Varices: None IMPRESSION: 1. 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 The hepatic veins remain patent. 2. Cirrhosis with evidence of portal hypertension (marked splenomegaly and small volume ascites). 3. Approximately 5.7 cm ill-defined mass in the posterior aspect of the left hepatic lobe concerning for  hepatocellular carcinoma. 4. Hepatic cirrhosis. Signed, Criselda Peaches, MD, [facility] Electronically Signed   By: Jacqulynn Cadet M.D.   On: 10/31/2020 06:36   CT LIVER ABDOMEN W WO CONTRAST  Result Date: 10/27/2020 CLINICAL DATA:  Dehydration with jaundice, weakness and fatigue. History of cirrhosis, hypertension, diabetes and breast cancer. EXAM: CT ABDOMEN WITHOUT AND WITH CONTRAST TECHNIQUE: Multidetector CT imaging of the abdomen was performed following the standard protocol before and following the bolus administration of intravenous contrast. CONTRAST:  162m OMNIPAQUE IOHEXOL 300 MG/ML  SOLN COMPARISON:  Abdominopelvic CT 10/26/2020. Abdominal MRI 07/04/2020. FINDINGS: Lower chest: Stable right middle lobe nodularity.  Atherosclerosis of the aorta and coronary arteries. No significant pleural or pericardial effusion. Hepatobiliary: Morphologic changes of cirrhosis are again noted. No arterial phase enhancing lesions are identified within the liver. However, a low-density lesion posteriorly in the left hepatic lobe appears enlarged compared with the previous MRI, measuring up to 4.9 x 3.7 cm on image 40/11. This demonstrates contrast washout on the delayed images. As seen on recent CT, there is a low-density lesion more peripherally in the lateral segment of the left lobe, measuring up to 2.6 cm on image 57/6. This has a nonspecific appearance and could reflect intrahepatic ductal dilatation. There is apparent new occlusion of the intrahepatic portal vein, suspicious for tumor thrombus. There is grossly stable extrahepatic biliary dilatation post cholecystectomy. No significant intrahepatic biliary dilatation. Pancreas: Unremarkable. No pancreatic ductal dilatation or surrounding inflammatory changes. Spleen: Stable mild splenomegaly without focal abnormality. Adrenals/Urinary Tract: The adrenal glands appear unchanged. No evidence of urinary tract calculus or hydronephrosis. Small bilateral renal cysts. Stomach/Bowel: Enteric contrast was administered and has passed into the colon. The stomach is decompressed. There is no significant small bowel distension or wall thickening. However, there is diffuse wall thickening of the visualized colon which appears progressive compared with recent CT, suspicious for colitis. Vascular/Lymphatic: Grossly stable adenopathy in the porta hepatis and retroperitoneum. There are multiple venous collaterals in the left upper quadrant. As above, poor visualization of contrast in the portal vein, suspicious for portal vein tumor thrombus. Other: Small volume of ascites, increased from previous MRI. Musculoskeletal: No acute or significant osseous findings. Multilevel lumbar spondylosis. IMPRESSION: 1.  Enlarging lesions in the left hepatic lobe without obvious arterial phase enhancement, but washout on the delayed images and probable portal vein tumor thrombus. In this patient with an elevated serum AFP level, these findings are worrisome for progressive multifocal hepatocellular carcinoma. 2. Progressive wall thickening throughout the visualized colon suspicious for colitis. This pattern can be seen with Clostridium difficile colitis. 3. Stable sequela of cirrhosis and portal hypertension with multiple venous collaterals in the left upper quadrant and mild ascites. 4. Grossly stable extrahepatic biliary dilatation post cholecystectomy. Electronically Signed   By: WRichardean SaleM.D.   On: 10/27/2020 19:19     Discharge Exam: Vitals:   11/02/20 0240 11/02/20 0818  BP: (!) 116/55 (!) 106/58  Pulse: 75 73  Resp: 20   Temp: 98.1 F (36.7 C)   SpO2: 95%    Vitals:   11/01/20 1323 11/01/20 2028 11/02/20 0240 11/02/20 0818  BP: (!) 127/53 (!) 110/53 (!) 116/55 (!) 106/58  Pulse: 72 71 75 73  Resp: 17 20 20    Temp: 98 F (36.7 C) 98 F (36.7 C) 98.1 F (36.7 C)   TempSrc: Oral     SpO2: 94% 99% 95%   Weight:      Height:  General: Pt is alert, awake, not in acute distress Cardiovascular: RRR, S1/S2 +, no rubs, no gallops Respiratory: CTA bilaterally, no wheezing, no rhonchi Abdominal: Soft, NT, ND, bowel sounds + Extremities: no edema, no cyanosis    The results of significant diagnostics from this hospitalization (including imaging, microbiology, ancillary and laboratory) are listed below for reference.     Microbiology: Recent Results (from the past 240 hour(s))  SARS CORONAVIRUS 2 (TAT 6-24 HRS) Nasopharyngeal Nasopharyngeal Swab     Status: None   Collection Time: 10/27/20 12:46 AM   Specimen: Nasopharyngeal Swab  Result Value Ref Range Status   SARS Coronavirus 2 NEGATIVE NEGATIVE Final    Comment: (NOTE) SARS-CoV-2 target nucleic acids are NOT DETECTED.  The  SARS-CoV-2 RNA is generally detectable in upper and lower respiratory specimens during the acute phase of infection. Negative results do not preclude SARS-CoV-2 infection, do not rule out co-infections with other pathogens, and should not be used as the sole basis for treatment or other patient management decisions. Negative results must be combined with clinical observations, patient history, and epidemiological information. The expected result is Negative.  Fact Sheet for Patients: SugarRoll.be  Fact Sheet for Healthcare Providers: https://www.woods-mathews.com/  This test is not yet approved or cleared by the [place] FDA and  has been authorized for detection and/or diagnosis of SARS-CoV-2 by FDA under an Emergency Use Authorization (EUA). This EUA will remain  in effect (meaning this test can be used) for the duration of the COVID-19 declaration under Se ction 564(b)(1) of the Act, 21 U.S.C. section 360bbb-3(b)(1), unless the authorization is terminated or revoked sooner.  Performed at [facility], [place] [address], [place], [place] [address]      Labs: BNP (last 3 results) No results for input(s): BNP in the last 8760 hours. Basic Metabolic Panel: Recent Labs  Lab 10/27/20 0414 10/29/20 0544 10/30/20 0518 10/31/20 0459 11/01/20 0410 11/02/20 0425  NA 125* 123* 126* 129* 130* 131*  K 3.3* 4.3 4.1 4.0 4.4 4.2  CL 95* 94* 96* 97* 98 99  CO2 21* 22 21* 23 23 23   GLUCOSE 40* 154* 102* 140* 131* 130*  BUN 15 18 16 14 13 13   CREATININE 0.71 0.62 0.57 0.47 0.52 0.55  CALCIUM 8.3* 8.5* 8.8* 9.3 9.5 9.6  MG 1.7  --   --   --   --   --    Liver Function Tests: Recent Labs  Lab 10/29/20 0544 10/30/20 0518 10/31/20 0459 11/01/20 0410 11/02/20 0425  AST 110* 98* 89* 94* 84*  ALT 69* 61* 54* 59* 60*  ALKPHOS 169* 137* 116 115 118  BILITOT 6.7* 6.8* 6.5* 7.6* 8.2*  PROT 5.8* 5.8* 6.4* 6.4* 6.3*  ALBUMIN 1.5* 2.2*  3.4* 3.5 3.3*   Recent Labs  Lab 10/26/20 2056  LIPASE 47   Recent Labs  Lab 10/27/20 0414 10/31/20 1403 11/01/20 2137  AMMONIA 40* 51* 42*   CBC: Recent Labs  Lab 10/26/20 2056 10/27/20 0414 10/30/20 0518 10/31/20 0459 11/01/20 0410 11/02/20 0425  WBC 8.1 5.6 3.3* 2.8* 5.2 5.3  NEUTROABS 5.7 3.2  --   --   --   --   HGB 12.0 10.8* 10.1* 8.7* 9.6* 9.7*  HCT 33.9* 30.7* 28.5* 24.6* 26.8* 27.7*  MCV 85.0 83.7 85.6 85.1 85.6 86.3  PLT 159 143* 105* 79* 91* 99*   Cardiac Enzymes: No results for input(s): CKTOTAL, CKMB, CKMBINDEX, TROPONINI in the last 168 hours. BNP: Invalid input(s): POCBNP CBG:  Recent Labs  Lab 10/27/20 0729 10/27/20 0800  GLUCAP 52* 75   D-Dimer No results for input(s): DDIMER in the last 72 hours. Hgb A1c No results for input(s): HGBA1C in the last 72 hours. Lipid Profile No results for input(s): CHOL, HDL, LDLCALC, TRIG, CHOLHDL, LDLDIRECT in the last 72 hours. Thyroid function studies No results for input(s): TSH, T4TOTAL, T3FREE, THYROIDAB in the last 72 hours.  Invalid input(s): FREET3 Anemia work up No results for input(s): VITAMINB12, FOLATE, FERRITIN, TIBC, IRON, RETICCTPCT in the last 72 hours. Urinalysis    Component Value Date/Time   COLORURINE AMBER (A) 10/26/2020 2100   APPEARANCEUR CLEAR 10/26/2020 2100   LABSPEC 1.014 10/26/2020 2100   PHURINE 5.0 10/26/2020 2100   GLUCOSEU NEGATIVE 10/26/2020 2100   HGBUR SMALL (A) 10/26/2020 2100   BILIRUBINUR SMALL (A) 10/26/2020 2100   KETONESUR NEGATIVE 10/26/2020 2100   PROTEINUR NEGATIVE 10/26/2020 2100   NITRITE NEGATIVE 10/26/2020 2100   LEUKOCYTESUR NEGATIVE 10/26/2020 2100   Sepsis Labs Invalid input(s): PROCALCITONIN,  WBC,  LACTICIDVEN Microbiology Recent Results (from the past 240 hour(s))  SARS CORONAVIRUS 2 (TAT 6-24 HRS) Nasopharyngeal Nasopharyngeal Swab     Status: None   Collection Time: 10/27/20 12:46 AM   Specimen: Nasopharyngeal Swab  Result Value Ref  Range Status   SARS Coronavirus 2 NEGATIVE NEGATIVE Final    Comment: (NOTE) SARS-CoV-2 target nucleic acids are NOT DETECTED.  The SARS-CoV-2 RNA is generally detectable in upper and lower respiratory specimens during the acute phase of infection. Negative results do not preclude SARS-CoV-2 infection, do not rule out co-infections with other pathogens, and should not be used as the sole basis for treatment or other patient management decisions. Negative results must be combined with clinical observations, patient history, and epidemiological information. The expected result is Negative.  Fact Sheet for Patients: SugarRoll.be  Fact Sheet for Healthcare Providers: https://www.woods-mathews.com/  This test is not yet approved or cleared by the [place] FDA and  has been authorized for detection and/or diagnosis of SARS-CoV-2 by FDA under an Emergency Use Authorization (EUA). This EUA will remain  in effect (meaning this test can be used) for the duration of the COVID-19 declaration under Se ction 564(b)(1) of the Act, 21 U.S.C. section [id number](b)(1), unless the authorization is terminated or revoked sooner.  Performed at [facility], [place] [address], [place], [place] [address]      Time coordinating discharge: 35 minutes  SIGNED:   Rodena Goldmann, DO [facility] 11/02/2020, 10:22 AM  If 7PM-7AM, please contact night-coverage [email or web address]

## 2020-11-01 NOTE — Telephone Encounter (Signed)
Forwarded to [employer] to sch'd OV

## 2020-11-01 NOTE — Evaluation (Signed)
Physical Therapy Evaluation Patient Details Name: Taylor Williams MRN: [id number] DOB: May 06, 1951 Today's Date: 11/01/2020   History of Present Illness  Taylor Williams  is a 70 y.o. 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  Patient reports that she has been on a hiatal hernia protocol, but 3 weeks ago she started having abdominal pain and vomiting that came on gradually and was getting progressively worse.  The abdominal pain feels like achy pain in her epigastrium and periumbilical region.  Does not radiate, its intermittent.  Nothing identified that makes it worse or better.  Decreased appetite associated.  She reports she started having diarrhea as well.  Patient denies any hematemesis or hematochezia or melena.  She reports because she was not feeling well she stopped taking all of her medications.  Patient reports that she had not really noticed that she was jaundiced.  Sister-in-law at bedside reports that she noticed the patient was jaundice on the 27th.  Patient does report that she has had a decrease in urine output but attributes that to decrease in p.o. intake.  She has been unsteady on her feet, with 2 falls in the last week, and fatigue.  She reports yesterday she was very somnolent and had a hard time getting up.  She has had generalized weakness as well.  One of her falls was 3 days ago included hitting her head.  She has had no changes in vision or hearing since then.  She has had no focal deficits.  Patient does report that her head still feels bruised.    Clinical Impression  Patient functioning near baseline for functional mobility and gait except having to lean on nearby objects for support when attempting ambulation using SPC, required use of RW for safety demonstrating slightly  labored cadence without loss of balance, limited mostly due to fatigue and tolerated sitting up in chair after therapy.  Plan:  Patient to be discharged home today and discharged from physical therapy to care of nursing for ambulation daily as tolerated for length of stay.     Follow Up Recommendations Home health PT;Supervision - Intermittent    Equipment Recommendations  Rolling walker with 5" wheels    Recommendations for Other Services       Precautions / Restrictions Precautions Precautions: Fall Restrictions Weight Bearing Restrictions: No      Mobility  Bed Mobility Overal bed mobility: Modified Independent                  Transfers Overall transfer level: Needs assistance Equipment used: Straight cane;Rolling walker (2 wheeled) Transfers: Sit to/from Stand;Stand Pivot Transfers Sit to Stand: Supervision Stand pivot transfers: Supervision       General transfer comment: had to lean on nearby objects for support using SPC, safer using RW  Ambulation/Gait Ambulation/Gait assistance: Supervision;Min guard Gait Distance (Feet): 75 Feet Assistive device: Rolling walker (2 wheeled);Straight cane Gait Pattern/deviations: Decreased step length - right;Decreased step length - left;Decreased stride length Gait velocity: decreased   General Gait Details: had to lean on nearby objects for support when using SPC, safer using RW with slightly labored cadence without loss of balance, limited mostly due to fatigue  Stairs            Wheelchair Mobility    Modified Rankin (Stroke Patients Only)  Balance Overall balance assessment: Needs assistance Sitting-balance support: Feet supported;No upper extremity supported Sitting balance-Leahy Scale: Good Sitting balance - Comments: seated at EOB   Standing balance support: During functional activity;Single extremity supported Standing balance-Leahy Scale: Poor Standing balance comment: fair/poor using  SPC, fair/good using RW                             Pertinent Vitals/Pain Pain Assessment: No/denies pain    Home Living Family/patient expects to be discharged to:: Private residence Living Arrangements: Alone Available Help at Discharge: Family;Available PRN/intermittently Type of Home: House Home Access: Stairs to enter Entrance Stairs-Rails: Right;Left;Can reach both Entrance Stairs-Number of Steps: 3 Home Layout: One level Home Equipment: Cane - single point;Bedside commode;Shower seat      Prior Function Level of Independence: Independent with assistive device(s)         Comments: [employer] SPC, drives     Hand Dominance        Extremity/Trunk Assessment   Upper Extremity Assessment Upper Extremity Assessment: Overall WFL for tasks assessed    Lower Extremity Assessment Lower Extremity Assessment: Generalized weakness    Cervical / Trunk Assessment Cervical / Trunk Assessment: Normal  Communication   Communication: No difficulties  Cognition Arousal/Alertness: Awake/alert Behavior During Therapy: WFL for tasks assessed/performed Overall Cognitive Status: Within Functional Limits for tasks assessed                                        General Comments      Exercises     Assessment/Plan    PT Assessment All further PT needs can be met in the next venue of care  PT Problem List Decreased strength;Decreased activity tolerance;Decreased balance;Decreased mobility       PT Treatment Interventions      PT Goals (Current goals can be found in the Care Plan section)  Acute Rehab PT Goals Patient Stated Goal: return home with family to assist PT Goal Formulation: With patient Time For Goal Achievement: 11/01/20 Potential to Achieve Goals: Good    Frequency     Barriers to discharge        Co-evaluation               AM-PAC PT "6 Clicks" Mobility  Outcome Measure Help needed turning from  your back to your side while in a flat bed without using bedrails?: None Help needed moving from lying on your back to sitting on the side of a flat bed without using bedrails?: None Help needed moving to and from a bed to a chair (including a wheelchair)?: A Little Help needed standing up from a chair using your arms (e.g., wheelchair or bedside chair)?: None Help needed to walk in hospital room?: A Little Help needed climbing 3-5 steps with a railing? : A Little 6 Click Score: 21    End of Session   Activity Tolerance: Patient tolerated treatment well;Patient limited by fatigue Patient left: in chair;with call bell/phone within reach Nurse Communication: Mobility status PT Visit Diagnosis: Unsteadiness on feet (R26.81);Other abnormalities of gait and mobility (R26.89);Muscle weakness (generalized) (M62.81)    Time: 4097-3532 PT Time Calculation (min) (ACUTE ONLY): 24 min   Charges:   PT Evaluation $PT Eval Moderate Complexity: 1 Mod PT Treatments $Therapeutic Activity: 23-37 mins        12:29 PM, 11/01/20  Lonell Grandchild, MPT Physical Therapist with [facility] [phone] office [phone] mobile phone

## 2020-11-02 DIAGNOSIS — D649 Anemia, unspecified: Secondary | ICD-10-CM | POA: Diagnosis not present

## 2020-11-02 DIAGNOSIS — C220 Liver cell carcinoma: Secondary | ICD-10-CM | POA: Diagnosis not present

## 2020-11-02 DIAGNOSIS — K7469 Other cirrhosis of liver: Secondary | ICD-10-CM | POA: Diagnosis not present

## 2020-11-02 DIAGNOSIS — R17 Unspecified jaundice: Secondary | ICD-10-CM | POA: Diagnosis not present

## 2020-11-02 DIAGNOSIS — M6281 Muscle weakness (generalized): Secondary | ICD-10-CM | POA: Diagnosis not present

## 2020-11-02 DIAGNOSIS — L03113 Cellulitis of right upper limb: Secondary | ICD-10-CM | POA: Diagnosis not present

## 2020-11-02 DIAGNOSIS — K7460 Unspecified cirrhosis of liver: Secondary | ICD-10-CM | POA: Diagnosis not present

## 2020-11-02 DIAGNOSIS — D015 Carcinoma in situ of liver, gallbladder and bile ducts: Secondary | ICD-10-CM | POA: Diagnosis not present

## 2020-11-02 DIAGNOSIS — K7581 Nonalcoholic steatohepatitis (NASH): Secondary | ICD-10-CM | POA: Diagnosis not present

## 2020-11-02 DIAGNOSIS — E43 Unspecified severe protein-calorie malnutrition: Secondary | ICD-10-CM | POA: Diagnosis not present

## 2020-11-02 DIAGNOSIS — E871 Hypo-osmolality and hyponatremia: Secondary | ICD-10-CM | POA: Diagnosis not present

## 2020-11-02 DIAGNOSIS — C50412 Malignant neoplasm of upper-outer quadrant of left female breast: Secondary | ICD-10-CM | POA: Diagnosis not present

## 2020-11-02 DIAGNOSIS — E559 Vitamin D deficiency, unspecified: Secondary | ICD-10-CM | POA: Diagnosis not present

## 2020-11-02 DIAGNOSIS — Z20822 Contact with and (suspected) exposure to covid-19: Secondary | ICD-10-CM | POA: Diagnosis not present

## 2020-11-02 DIAGNOSIS — Z8719 Personal history of other diseases of the digestive system: Secondary | ICD-10-CM | POA: Diagnosis not present

## 2020-11-02 DIAGNOSIS — I10 Essential (primary) hypertension: Secondary | ICD-10-CM | POA: Diagnosis not present

## 2020-11-02 DIAGNOSIS — E46 Unspecified protein-calorie malnutrition: Secondary | ICD-10-CM | POA: Diagnosis not present

## 2020-11-02 DIAGNOSIS — R918 Other nonspecific abnormal finding of lung field: Secondary | ICD-10-CM | POA: Diagnosis not present

## 2020-11-02 DIAGNOSIS — K7290 Hepatic failure, unspecified without coma: Secondary | ICD-10-CM | POA: Diagnosis not present

## 2020-11-02 DIAGNOSIS — C229 Malignant neoplasm of liver, not specified as primary or secondary: Secondary | ICD-10-CM | POA: Diagnosis not present

## 2020-11-02 DIAGNOSIS — E119 Type 2 diabetes mellitus without complications: Secondary | ICD-10-CM | POA: Diagnosis not present

## 2020-11-02 DIAGNOSIS — D6489 Other specified anemias: Secondary | ICD-10-CM | POA: Diagnosis not present

## 2020-11-02 DIAGNOSIS — C50912 Malignant neoplasm of unspecified site of left female breast: Secondary | ICD-10-CM | POA: Diagnosis not present

## 2020-11-02 DIAGNOSIS — J90 Pleural effusion, not elsewhere classified: Secondary | ICD-10-CM | POA: Diagnosis not present

## 2020-11-02 DIAGNOSIS — D539 Nutritional anemia, unspecified: Secondary | ICD-10-CM | POA: Diagnosis not present

## 2020-11-02 DIAGNOSIS — R2689 Other abnormalities of gait and mobility: Secondary | ICD-10-CM | POA: Diagnosis not present

## 2020-11-02 DIAGNOSIS — R41841 Cognitive communication deficit: Secondary | ICD-10-CM | POA: Diagnosis not present

## 2020-11-02 DIAGNOSIS — E876 Hypokalemia: Secondary | ICD-10-CM | POA: Diagnosis not present

## 2020-11-02 DIAGNOSIS — Z853 Personal history of malignant neoplasm of breast: Secondary | ICD-10-CM | POA: Diagnosis not present

## 2020-11-02 LAB — COMPREHENSIVE METABOLIC PANEL
ALT: 60 U/L — ABNORMAL HIGH (ref 0–44)
AST: 84 U/L — ABNORMAL HIGH (ref 15–41)
Albumin: 3.3 g/dL — ABNORMAL LOW (ref 3.5–5.0)
Alkaline Phosphatase: 118 U/L (ref 38–126)
Anion gap: 9 (ref 5–15)
BUN: 13 mg/dL (ref 8–23)
CO2: 23 mmol/L (ref 22–32)
Calcium: 9.6 mg/dL (ref 8.9–10.3)
Chloride: 99 mmol/L (ref 98–111)
Creatinine, Ser: 0.55 mg/dL (ref 0.44–1.00)
GFR, Estimated: >60 mL/min (ref >60)
Glucose, Bld: 130 mg/dL — ABNORMAL HIGH (ref 70–99)
Potassium: 4.2 mmol/L (ref 3.5–5.1)
Sodium: 131 mmol/L — ABNORMAL LOW (ref 135–145)
Total Bilirubin: 8.2 mg/dL — ABNORMAL HIGH (ref 0.3–1.2)
Total Protein: 6.3 g/dL — ABNORMAL LOW (ref 6.5–8.1)

## 2020-11-02 LAB — CBC
HCT: 27.7 % — ABNORMAL LOW (ref 36.0–46.0)
Hemoglobin: 9.7 g/dL — ABNORMAL LOW (ref 12.0–15.0)
MCH: 30.2 pg (ref 26.0–34.0)
MCHC: 35 g/dL (ref 30.0–36.0)
MCV: 86.3 fL (ref 80.0–100.0)
Platelets: 99 10*3/uL — ABNORMAL LOW (ref 150–400)
RBC: 3.21 MIL/uL — ABNORMAL LOW (ref 3.87–5.11)
RDW: 21.5 % — ABNORMAL HIGH (ref 11.5–15.5)
WBC: 5.3 10*3/uL (ref 4.0–10.5)
nRBC: 0 % (ref 0.0–0.2)

## 2020-11-02 LAB — RESP PANEL BY RT-PCR (FLU A&B, COVID) ARPGX2
Influenza A by PCR: NEGATIVE
Influenza B by PCR: NEGATIVE
SARS Coronavirus 2 by RT PCR: NEGATIVE

## 2020-11-02 LAB — PROTIME-INR
INR: 1.8 — ABNORMAL HIGH (ref 0.8–1.2)
Prothrombin Time: 20 seconds — ABNORMAL HIGH (ref 11.4–15.2)

## 2020-11-02 LAB — PROCALCITONIN: Procalcitonin: 0.26 ng/mL

## 2020-11-02 MED ORDER — DOXYCYCLINE HYCLATE 100 MG PO TABS
100.0000 mg | ORAL_TABLET | Freq: Two times a day (BID) | ORAL | Status: DC
  Administered 2020-11-02: 100 mg via ORAL
  Filled 2020-11-02: qty 1

## 2020-11-02 MED ORDER — DOXYCYCLINE HYCLATE 100 MG PO TABS: 100.0000 mg | ORAL_TABLET | Freq: Two times a day (BID) | ORAL | 0 refills | Status: AC

## 2020-11-02 NOTE — TOC Transition Note (Signed)
Transition of Care [facility]) - CM/SW Discharge Note   Patient Details  Name: Delmy Holdren MRN: [id number] Date of Birth: July 11, 1951  [facility]) CM/SW Contact:  Boneta Lucks, RN Phone Number: 11/02/2020, 1:41 PM   Clinical Narrative:   [facility] offered a bed. Family is accepting.  PASSR completed [id number]. Given to [place] at [place]. Patient needs COVID test. Medical necessity has been Printed. RN to call report. TOC will schedule EMS when RN is ready. Kitty updated with DC plan.     Final next level of care: Skilled Nursing Facility Barriers to Discharge: Barriers Resolved   Patient Goals and CMS Choice Patient states their goals for this hospitalization and ongoing recovery are:: to go to SNF. CMS Medicare.gov Compare Post Acute Care list provided to:: Patient Represenative (must comment) Choice offered to / list presented to : Sibling  Discharge Placement              Patient chooses bed at:  [facility]) Patient to be transferred to facility by: EMS Name of family member notified: Kitty Patient and family notified of of transfer: 11/02/20  Discharge Plan and Services               DME Arranged: N/A DME Agency: NA    HH Arranged: RN,PT [place] Agency: [place] ([place]) Date [place]: 10/30/20 Time [place]: 1200 Representative spoke with at [place]: Romualdo Bolk    Readmission Risk Interventions Readmission Risk Prevention Plan 11/02/2020 10/31/2020  Transportation Screening Complete Complete  PCP or Specialist Appt within 5-7 Days Complete -  Home Care Screening - Complete  Medication Review (RN CM) - Complete  Some recent data might be hidden

## 2020-11-02 NOTE — Plan of Care (Signed)

## 2020-11-02 NOTE — NC FL2 (Signed)
[place] LEVEL OF CARE SCREENING TOOL     IDENTIFICATION  Patient Name: Taylor Williams Birthdate: Mar 28, 1951 Sex: female Admission Date (Current Location): 10/26/2020  [facility] and [place] Number:  [employer] and Address:  [place] [address], [place]      Provider Number: [id number]  Attending Physician Name and Address:  Rodena Goldmann, DO  Relative Name and Phone Number:  Rhodes,Kitty (Sister)   [phone]    Current Level of Care: Hospital Recommended Level of Care: [place] Prior Approval Number:    Date Approved/Denied:   PASRR Number:    Discharge Plan: SNF    Current Diagnoses: Patient Active Problem List   Diagnosis Date Noted  . Malignant neoplasm of liver ([place])   . Jaundice 10/27/2020  . Hyponatremia 10/27/2020  . Hypokalemia 10/27/2020  . Transaminitis 10/27/2020  . Severe protein-calorie malnutrition ([place]) 10/27/2020  . DMII (diabetes mellitus, type 2) ([place]) 10/27/2020  . Tobacco abuse 10/27/2020  . Pain of upper abdomen   . Anemia   . Bilateral leg edema 03/07/2020  . Cirrhosis, non-alcoholic ([place]) 19/75/8832  . Hepatic cirrhosis ([place]) 02/23/2019  . Varicose veins of bilateral lower extremities with other complications 54/98/2641  . Solitary pulmonary nodule 07/25/2016  . High blood sugar 02/02/2016  . Diarrhea 01/19/2016  . Carcinoma of upper-outer quadrant of left female breast ([place]) 01/01/2016    Orientation RESPIRATION BLADDER Height & Weight     Self,Place,Situation  Normal Continent Weight: 96.2 kg Height:  5' 8"  (172.7 cm)  BEHAVIORAL SYMPTOMS/MOOD NEUROLOGICAL BOWEL NUTRITION STATUS      Continent Diet (heart healthy/carb modified)  AMBULATORY STATUS COMMUNICATION OF NEEDS Skin   Supervision Verbally Normal                       Personal Care Assistance Level of Assistance  Bathing,Feeding,Dressing Bathing Assistance: Limited assistance Feeding  assistance: Independent Dressing Assistance: Limited assistance     Functional Limitations Info  Sight,Hearing,Speech Sight Info: Adequate Hearing Info: Adequate Speech Info: Adequate    SPECIAL CARE FACTORS FREQUENCY  PT (By licensed PT)     PT Frequency: 5x/week              Contractures Contractures Info: Not present    Additional Factors Info  Code Status,Psychotropic,Allergies Code Status Info: DNR Allergies Info: Aspirin, other, fish allergy, procaine Psychotropic Info: xanax, celexa, effexor xr         Current Medications (11/02/2020):  This is the current hospital active medication list Current Facility-Administered Medications  Medication Dose Route Frequency Provider Last Rate Last Admin  . (feeding supplement) PROSource Plus liquid 30 mL  30 mL Oral BID BM Barton Dubois, MD   30 mL at 11/02/20 0809  . ALPRAZolam Duanne Moron) tablet 0.25 mg  0.25 mg Oral BID Zierle-Ghosh, Asia B, DO   0.25 mg at 11/02/20 0809  . Chlorhexidine Gluconate Cloth 2 % PADS 6 each  6 each Topical Daily Barton Dubois, MD   6 each at 10/31/20 [phone]  . citalopram (CELEXA) tablet 20 mg  20 mg Oral Daily Zierle-Ghosh, Asia B, DO   20 mg at 11/02/20 0808  . doxycycline (VIBRA-TABS) tablet 100 mg  100 mg Oral Q12H Shah, Pratik D, DO      . feeding supplement (ENSURE ENLIVE / ENSURE PLUS) liquid 237 mL  237 mL Oral BID BM Zierle-Ghosh, Asia B, DO   237 mL at 11/02/20 0823  .  furosemide (LASIX) tablet 20 mg  20 mg Oral Daily Zierle-Ghosh, Asia B, DO   20 mg at 11/01/20 0914  . heparin injection 5,000 Units  5,000 Units Subcutaneous Q8H Zierle-Ghosh, Asia B, DO   5,000 Units at 11/02/20 0558  . ibuprofen (ADVIL) tablet 400 mg  400 mg Oral Q8H PRN Barton Dubois, MD      . lisinopril (ZESTRIL) tablet 5 mg  5 mg Oral Daily Zierle-Ghosh, Asia B, DO   5 mg at 11/01/20 0915  . nicotine (NICODERM CQ - dosed in mg/24 hours) patch 21 mg  21 mg Transdermal Daily Zierle-Ghosh, Asia B, DO   21 mg at 11/02/20  0813  . ondansetron (ZOFRAN) tablet 4 mg  4 mg Oral Q6H PRN Zierle-Ghosh, Asia B, DO       Or  . ondansetron (ZOFRAN) injection 4 mg  4 mg Intravenous Q6H PRN Zierle-Ghosh, Asia B, DO   4 mg at 10/29/20 0211  . oxyCODONE (Oxy IR/ROXICODONE) immediate release tablet 5 mg  5 mg Oral Q4H PRN Zierle-Ghosh, Asia B, DO      . pantoprazole (PROTONIX) EC tablet 40 mg  40 mg Oral QAC breakfast Aliene Altes S, PA-C   40 mg at 11/02/20 0809  . promethazine (PHENERGAN) 6.25 mg in sodium chloride 0.9 % 50 mL IVPB  6.25 mg Intravenous Q8H PRN Manuella Ghazi, Pratik D, DO      . spironolactone (ALDACTONE) tablet 25 mg  25 mg Oral Daily Zierle-Ghosh, Asia B, DO   25 mg at 11/02/20 0809  . venlafaxine XR (EFFEXOR-XR) 24 hr capsule 75 mg  75 mg Oral BID Zierle-Ghosh, Asia B, DO   75 mg at 11/02/20 0037     Discharge Medications: Please see discharge summary for a list of discharge medications.  Relevant Imaging Results:  Relevant Lab Results:   Additional Information SSN [id number]  Boneta Lucks, RN

## 2020-11-02 NOTE — Progress Notes (Signed)
Nsg Discharge Note  Admit Date:  10/26/2020 Discharge date: 11/02/2020   Kaneshia Cater to be D/C'd to [facility] per MD order.  AVS completed.  Copy for chart, and copy for patient signed, and dated. Patient/caregiver able to verbalize understanding.  Discharge Medication: Allergies as of 11/02/2020      Reactions   Aspirin Swelling   Facial swelling and panic attack when she took pain medication that contained aspirin   Other Swelling   novacaine - patient states reaction happened as a child and she believes it caused facial swelling, mother always told her the MD said never take again   Fish Allergy Other (See Comments)   gagging   Procaine       Medication List    TAKE these medications   ALPRAZolam 0.25 MG tablet Commonly known as: XANAX Take 1 tablet (0.25 mg total) by mouth 2 (two) times daily.   cetirizine 10 MG tablet Commonly known as: ZYRTEC Take 10 mg by mouth at bedtime.   citalopram 20 MG tablet Commonly known as: CELEXA Take 20 mg by mouth daily.   dapagliflozin propanediol 5 MG Tabs tablet Commonly known as: FARXIGA Take 5 mg by mouth daily.   doxycycline 100 MG tablet Commonly known as: VIBRA-TABS Take 1 tablet (100 mg total) by mouth every 12 (twelve) hours for 7 days.   doxycycline 50 MG capsule Commonly known as: MONODOX Take 50 mg by mouth daily.   ergocalciferol 1.25 MG (50000 UT) capsule Commonly known as: VITAMIN D2 Take 1 capsule (50,000 Units total) by mouth once a week.   feeding supplement Liqd Take 237 mLs by mouth 2 (two) times daily between meals.   furosemide 20 MG tablet Commonly known as: LASIX Take 1 tablet (20 mg total) by mouth daily.   glipiZIDE 10 MG tablet Commonly known as: GLUCOTROL Take 10 mg by mouth 2 (two) times daily before a meal.   hydrocortisone 2.5 % cream Apply 1 application topically at bedtime.   insulin glargine 100 UNIT/ML injection Commonly known as: LANTUS Inject 60 Units into the skin at bedtime.    ketoconazole 2 % cream Commonly known as: NIZORAL Apply 1 application topically daily.   Krill Oil 500 MG Caps Take 1,500 mg by mouth 2 (two) times daily.   lisinopril 5 MG tablet Commonly known as: ZESTRIL Take 5 mg by mouth daily.   metFORMIN 1000 MG tablet Commonly known as: GLUCOPHAGE Take 1,000 mg by mouth 2 (two) times daily with a meal.   pantoprazole 40 MG tablet Commonly known as: PROTONIX Take 1 tablet (40 mg total) by mouth daily before breakfast.   potassium chloride SA 20 MEQ tablet Commonly known as: KLOR-CON Take 1 tablet (20 mEq total) by mouth daily.   sitaGLIPtin 100 MG tablet Commonly known as: JANUVIA Take 100 mg by mouth daily.   spironolactone 25 MG tablet Commonly known as: Aldactone Take 1 tablet (25 mg total) by mouth daily.   venlafaxine XR 75 MG 24 hr capsule Commonly known as: EFFEXOR-XR Take 75 mg by mouth 2 (two) times daily.            Durable Medical Equipment  (From admission, onward)         Start     Ordered   11/01/20 1301  For home use only DME Walker rolling  Once       Question Answer Comment  Walker: With 5 Inch Wheels   Patient needs a walker to treat with the following  condition Weakness      11/01/20 1300          Discharge Assessment: Vitals:   11/02/20 0818 11/02/20 1344  BP: (!) 106/58 (!) 105/57  Pulse: 73 67  Resp:  18  Temp:  (!) 97.5 F (36.4 C)  SpO2:  100%   Skin clean, dry and intact without evidence of skin break down, no evidence of skin tears noted. IV catheter discontinued intact. Site without signs and symptoms of complications - no redness or edema noted at insertion site, patient denies c/o pain - only slight tenderness at site.  Dressing with slight pressure applied.  D/c Instructions-Education: Discharge instructions given to patient/family with verbalized understanding. D/c education completed with patient/family including follow up instructions, medication list, d/c activities  limitations if indicated, with other d/c instructions as indicated by MD - patient able to verbalize understanding, all questions fully answered. Patient instructed to return to ED, call 911, or call MD for any changes in condition.  Patient escorted via EMS to [place], RN 11/02/2020 6:39 PM

## 2020-11-02 NOTE — Progress Notes (Signed)
Patient seen and evaluated this a.m. at bedside.  Please refer to discharge summary dictated 4/6 for full details regarding hospital admission.  Doxycycline 100 mg every 12 hours added for 7 days on account of what appears to be new right upper extremity cellulitis.  No other acute events noted overnight.  Total care time: 25 minutes

## 2020-11-02 NOTE — Progress Notes (Signed)
Nutrition Follow-up  DOCUMENTATION CODES:   Obesity unspecified  INTERVENTION:   -Continue 30 ml Prosource Plus BID, each supplement provides 100 kcals and 15 grams protein -Continue Ensure Enlive po BID, each supplement provides 350 kcal and 20 grams of protein -Magic cup TID with meals, each supplement provides 290 kcal and 9 grams of protein -Liberalize diet to regular  NUTRITION DIAGNOSIS:   Inadequate oral intake (prior to admission- due to vomiting abdominal pain) related to acute illness as evidenced by per patient/family report.  Ongoing  GOAL:   Patient will meet greater than or equal to 90% of their needs  Progressing   MONITOR:   PO intake,Labs,Weight trends,Supplement acceptance  REASON FOR ASSESSMENT:   Malnutrition Screening Tool    ASSESSMENT:   Patient is a 70 yo female with history of HTN, DM 2, Cirrhosis (not ETOH related), and breast cancer. She presented with c/o weakness, abdominal pain and vomiting.  Reviewed I/O's: +360 ml x 24 hours and +4 L since admission  Spoke with pt at bedside, who was pleasant and in good spirits today. She shares that she feels better and stronger today. Per pt, her appetite is still very poor; noted meal completion 25-50%. Pt shares that she has little desire to eat and often does not like the hospital food. RD discussed possibility of liberalizing diet to help promote oral intake. Pt amenable to this plan.   Pt has been experiencing taste changes. She does not care for most of the drinks available and reports she does not like Ensure offered. She is amenable to [employer]. Also obtain additional food preferences.   RD discussed importance of good meal and supplement intake to promote healing. Pt appreciative of RD visit.   Per TOC notes, pt awaiting SNF placement.   Labs reviewed: Na: 131.   Diet Order:   Diet Order            Diet - low sodium heart healthy           Diet heart healthy/carb modified Room service  appropriate? Yes; Fluid consistency: Thin  Diet effective now                 EDUCATION NEEDS:   Education needs have been addressed  Skin:  Skin Assessment: Reviewed RN Assessment  Last BM:  11/02/20  Height:   Ht Readings from Last 1 Encounters:  10/26/20 5' 8"  (1.727 m)    Weight:   Wt Readings from Last 1 Encounters:  10/26/20 96.2 kg   BMI:  Body mass index is 32.23 kg/m.  Estimated Nutritional Needs:   Kcal:  8502-7741  Protein:  100-110 gr  Fluid:  1.5-1.7 liters daily    Loistine Chance, RD, LDN, [place] Registered Dietitian II Certified Diabetes Care and Education Specialist Please refer to [facility] for RD and/or RD on-call/weekend/after hours pager

## 2020-11-06 ENCOUNTER — Other Ambulatory Visit: Payer: Self-pay | Admitting: *Deleted

## 2020-11-06 NOTE — Patient Outreach (Signed)
Member screened for potential [facility] Care Management needs.  Per [place] (Patient Taylor Williams) member resides in [facility].   Communication sent to [facility] SNF SW to inquire about transition plans.  Will continue to follow while member resides in SNF.    Marthenia Rolling, MSN, RN,BSN [place] Acute Care Coordinator [phone] [facility]) [phone]  (Toll free office)

## 2020-11-07 ENCOUNTER — Inpatient Hospital Stay (HOSPITAL_COMMUNITY): Payer: Medicare Other

## 2020-11-07 ENCOUNTER — Encounter (HOSPITAL_COMMUNITY): Payer: Self-pay | Admitting: Hematology

## 2020-11-07 ENCOUNTER — Encounter (HOSPITAL_COMMUNITY): Payer: Self-pay

## 2020-11-07 ENCOUNTER — Inpatient Hospital Stay (HOSPITAL_COMMUNITY): Payer: Medicare Other | Attending: Hematology | Admitting: Hematology

## 2020-11-07 ENCOUNTER — Other Ambulatory Visit: Payer: Self-pay

## 2020-11-07 VITALS — BP 124/47 | HR 74 | Temp 96.9°F | Resp 20 | Wt 205.5 lb

## 2020-11-07 DIAGNOSIS — C50412 Malignant neoplasm of upper-outer quadrant of left female breast: Secondary | ICD-10-CM | POA: Diagnosis not present

## 2020-11-07 DIAGNOSIS — K7460 Unspecified cirrhosis of liver: Secondary | ICD-10-CM | POA: Diagnosis not present

## 2020-11-07 DIAGNOSIS — D6489 Other specified anemias: Secondary | ICD-10-CM

## 2020-11-07 DIAGNOSIS — Z853 Personal history of malignant neoplasm of breast: Secondary | ICD-10-CM | POA: Insufficient documentation

## 2020-11-07 DIAGNOSIS — D649 Anemia, unspecified: Secondary | ICD-10-CM | POA: Diagnosis not present

## 2020-11-07 DIAGNOSIS — E559 Vitamin D deficiency, unspecified: Secondary | ICD-10-CM

## 2020-11-07 DIAGNOSIS — C220 Liver cell carcinoma: Secondary | ICD-10-CM | POA: Diagnosis not present

## 2020-11-07 DIAGNOSIS — E119 Type 2 diabetes mellitus without complications: Secondary | ICD-10-CM | POA: Diagnosis not present

## 2020-11-07 DIAGNOSIS — D539 Nutritional anemia, unspecified: Secondary | ICD-10-CM

## 2020-11-07 LAB — FERRITIN: Ferritin: 132 ng/mL (ref 11–307)

## 2020-11-07 LAB — APTT: aPTT: 44 seconds — ABNORMAL HIGH (ref 24–36)

## 2020-11-07 LAB — IRON AND TIBC
Iron: 151 ug/dL (ref 28–170)
Saturation Ratios: 89 % — ABNORMAL HIGH (ref 10.4–31.8)
TIBC: 170 ug/dL — ABNORMAL LOW (ref 250–450)
UIBC: 19 ug/dL

## 2020-11-07 LAB — HEPATIC FUNCTION PANEL
ALT: 69 U/L — ABNORMAL HIGH (ref 0–44)
AST: 106 U/L — ABNORMAL HIGH (ref 15–41)
Albumin: 3.2 g/dL — ABNORMAL LOW (ref 3.5–5.0)
Alkaline Phosphatase: 144 U/L — ABNORMAL HIGH (ref 38–126)
Bilirubin, Direct: 2.5 mg/dL — ABNORMAL HIGH (ref 0.0–0.2)
Indirect Bilirubin: 3.5 mg/dL — ABNORMAL HIGH (ref 0.3–0.9)
Total Bilirubin: 6 mg/dL — ABNORMAL HIGH (ref 0.3–1.2)
Total Protein: 6.9 g/dL (ref 6.5–8.1)

## 2020-11-07 LAB — PROTIME-INR
INR: 1.6 — ABNORMAL HIGH (ref 0.8–1.2)
Prothrombin Time: 19.4 seconds — ABNORMAL HIGH (ref 11.4–15.2)

## 2020-11-07 LAB — VITAMIN B12: Vitamin B-12: 1337 pg/mL — ABNORMAL HIGH (ref 180–914)

## 2020-11-07 LAB — FOLATE: Folate: 5.6 ng/mL — ABNORMAL LOW (ref >5.9)

## 2020-11-07 LAB — AMMONIA: Ammonia: 42 umol/L — ABNORMAL HIGH (ref 9–35)

## 2020-11-07 NOTE — Patient Instructions (Signed)
[facility] Discharge Instructions  You were seen and examined today by Dr. Delton Coombes. Dr. Delton Coombes is a medical oncologist, meaning he specializes in the management of cancer diagnoses with medications. Dr. Delton Coombes discussed your past medical history, family history of cancer and the events that led to you being here today.  Your recent CT scan reveals a new liver lesion, in addition to the known two liver lesions from 07/04/2020, concerning for hepatocellular carcinoma. This does not appear to be directly related to your previous breast cancer diagnosis. This is a type of cancer arising within the liver. Dr. Delton Coombes has recommended additional imaging to assess the current stage of the cancer as well as additional lab work to see how well your liver is currently functioning.  Follow-up as scheduled.   Thank you for choosing [facility] to provide your oncology and hematology care.  To afford each patient quality time with our provider, please arrive at least 15 minutes before your scheduled appointment time.   If you have a lab appointment with the [facility] please come in thru the Main Entrance and check in at the main information desk.  You need to re-schedule your appointment should you arrive 10 or more minutes late.  We strive to give you quality time with our providers, and arriving late affects you and other patients whose appointments are after yours.  Also, if you no show three or more times for appointments you may be dismissed from the clinic at the providers discretion.     Again, thank you for choosing [facility].  Our hope is that these requests will decrease the amount of time that you wait before being seen by our physicians.       _____________________________________________________________  Should you have questions after your visit to [facility], please contact our  office at [phone] and follow the prompts.  Our office hours are 8:00 a.m. and 4:30 p.m. Monday - Friday.  Please note that voicemails left after 4:00 p.m. may not be returned until the following business day.  We are closed weekends and major holidays.  You do have access to a nurse 24-7, just call the main number to the clinic [phone] and do not press any options, hold on the line and a nurse will answer the phone.    For prescription refill requests, have your pharmacy contact our office and allow 72 hours.    Due to Covid, you will need to wear a mask upon entering the hospital. If you do not have a mask, a mask will be given to you at the Main Entrance upon arrival. For doctor visits, patients may have 1 support person age 40 or older with them. For treatment visits, patients can not have anyone with them due to social distancing guidelines and our immunocompromised population.

## 2020-11-07 NOTE — Progress Notes (Signed)
[facility] [address] [place] [address]   CLINIC:  Medical Oncology/Hematology  CONSULT NOTE  Patient Care Team: [name], MD as PCP - General (Internal Medicine) Josue Hector, MD as PCP - Cardiology (Cardiology) Brien Mates, RN as Oncology Nurse Navigator (Oncology)  CHIEF COMPLAINTS/PURPOSE OF CONSULTATION:  Evaluation of multifocal hepatocellular carcinoma  HISTORY OF PRESENTING ILLNESS:  Ms. Taylor Williams 71 y.o. female is here because of evaluation of multifocal hepatocellular carcinoma, at the request of Dr. Heath Lark from [facility]. She went to [facility] on 03/31 after having abdominal pain and vomiting which started 3 weeks prior and that progressed gradually. The pain was in the epigastric and periumbilical regions, non-radiating, with no alleviating or aggravating factors.  Today she is accompanied by Herbie [place], her brother, and she reports feeling fair. She has known about the cirrhosis for the past 1-2 years. She has never received any treatment for her liver. She noted having diarrhea and abdominal pain for months prior to going to [facility]. She reports having 3-4 episodes of watery BM last night. She denies having hematochezia or hematemesis. She was also confused which is improving.  She currently lives in [facility] rehab center since coming out of [facility]. Prior to the hospitalization she was living at home and being independent, even driving herself occasionally while her brother drove her the rest of the time. She has just started doing PT this AM; she is not able to walk with or without a walker. She needs a team to come help her to take her to the bathroom. 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 She has not smoked for the past 2 weeks, but was smoking 1/2 to 1 PPD for the past 54 years. Her mother had colon cancer; PGF had lung cancer; PGM had colon cancer; paternal uncle had colon cancer; 2 more paternal aunts had cancer.  She is  scheduled to see Dr. Laural Golden on 04/19.  MEDICAL HISTORY:  Past Medical History:  Diagnosis Date  . Anemia   . Anxiety   . Breast cancer ([place])   . Cirrhosis of liver not due to alcohol ([place])   . Complication of anesthesia    Recall during general anesthesia/ankle surgery/ 'light weight'  very sensitive to narcotics  . Depression   . Diabetes mellitus without complication ([place])   . High cholesterol   . Hypertension     SURGICAL HISTORY: Past Surgical History:  Procedure Laterality Date  . BIOPSY  04/19/2019   Procedure: BIOPSY;  Surgeon: Rogene Houston, MD;  Location[facility];  Service: Endoscopy;;  esophageal  . BREAST LUMPECTOMY Left    states for ductal carcinoma, had surgery in late may  . COLONOSCOPY    . ENDOVENOUS ABLATION SAPHENOUS VEIN W/ LASER Right 04/15/2018   endovenous laser ablation R GSV by Ruta Hinds MD   . ENDOVENOUS ABLATION SAPHENOUS VEIN W/ LASER Left 05/13/2018   endovenous laser ablation left greater saphenous vein by Ruta Hinds MD   . ESOPHAGOGASTRODUODENOSCOPY (EGD) WITH PROPOFOL N/A 04/19/2019   Procedure: ESOPHAGOGASTRODUODENOSCOPY (EGD) WITH PROPOFOL;  Surgeon: Rogene Houston, MD;  Location[facility];  Service: Endoscopy;  Laterality: N/A;  7:30  . galbladder    . GANGLION CYST EXCISION    . TONSILLECTOMY AND ADENOIDECTOMY      SOCIAL HISTORY: Social History   Socioeconomic History  . Marital status: Widowed    Spouse name: Not on file  .  Number of children: Not on file  . Years of education: Not on file  . Highest education level: Not on file  Occupational History  . Not on file  Tobacco Use  . Smoking status: Current Every Day Smoker    Packs/day: 1.00    Years: 40.00    Pack years: 40.00    Start date: 10/28/1965  . Smokeless tobacco: Never Used  Vaping Use  . Vaping Use: Never used  Substance and Sexual Activity  . Alcohol use: No    Comment: 2 - 3 mixed drink yearly  . Drug use: No  . Sexual activity: Not on  file  Other Topics Concern  . Not on file  Social History Narrative  . Not on file   Social Determinants of Health   Financial Resource Strain: Low Risk   . Difficulty of Paying Living Expenses: Not hard at all  Food Insecurity: No Food Insecurity  . Worried About Charity fundraiser in the Last Year: Never true  . Ran Out of Food in the Last Year: Never true  Transportation Needs: No Transportation Needs  . Lack of Transportation (Medical): No  . Lack of Transportation (Non-Medical): No  Physical Activity: Inactive  . Days of Exercise per Week: 0 days  . Minutes of Exercise per Session: 0 min  Stress: Stress Concern Present  . Feeling of Stress : To some extent  Social Connections: Moderately Isolated  . Frequency of Communication with Friends and Family: More than three times a week  . Frequency of Social Gatherings with Friends and Family: More than three times a week  . Attends Religious Services: 1 to 4 times per year  . Active Member of Clubs or Organizations: No  . Attends Archivist Meetings: Never  . Marital Status: Widowed  Intimate Partner Violence: Not At Risk  . Fear of Current or Ex-Partner: No  . Emotionally Abused: No  . Physically Abused: No  . Sexually Abused: No    FAMILY HISTORY: Family History  Problem Relation Age of Onset  . Cancer - Other Mother   . Hypertension Father   . Diabetes Father   . Dementia Father   . Hyperthyroidism Brother   . Cancer Paternal Grandmother   . Cancer Paternal Grandfather     ALLERGIES:  is allergic to aspirin, other, fish allergy, and procaine.  MEDICATIONS:  Current Outpatient Medications  Medication Sig Dispense Refill  . ALPRAZolam (XANAX) 0.25 MG tablet Take 1 tablet (0.25 mg total) by mouth 2 (two) times daily. 10 tablet 0  . cetirizine (ZYRTEC) 10 MG tablet Take 10 mg by mouth at bedtime.     . citalopram (CELEXA) 20 MG tablet Take 20 mg by mouth daily.    . dapagliflozin propanediol (FARXIGA)  5 MG TABS tablet Take 5 mg by mouth daily.    Marland Kitchen doxycycline (MONODOX) 50 MG capsule Take 50 mg by mouth daily.    Marland Kitchen doxycycline (VIBRA-TABS) 100 MG tablet Take 1 tablet (100 mg total) by mouth every 12 (twelve) hours for 7 days. 14 tablet 0  . ergocalciferol (VITAMIN D2) 1.25 MG (50000 UT) capsule Take 1 capsule (50,000 Units total) by mouth once a week. 16 capsule 3  . furosemide (LASIX) 20 MG tablet Take 1 tablet (20 mg total) by mouth daily. 30 tablet 3  . glipiZIDE (GLUCOTROL) 10 MG tablet Take 10 mg by mouth 2 (two) times daily before a meal.    . hydrocortisone 2.5 % cream  Apply 1 application topically at bedtime.     . insulin glargine (LANTUS) 100 UNIT/ML injection Inject 60 Units into the skin at bedtime.    Marland Kitchen ketoconazole (NIZORAL) 2 % cream Apply 1 application topically daily.     Marland Kitchen lisinopril (PRINIVIL,ZESTRIL) 5 MG tablet Take 5 mg by mouth daily.    . metFORMIN (GLUCOPHAGE) 1000 MG tablet Take 1,000 mg by mouth 2 (two) times daily with a meal.    . pantoprazole (PROTONIX) 40 MG tablet Take 1 tablet (40 mg total) by mouth daily before breakfast. 30 tablet 1  . potassium chloride SA (K-DUR,KLOR-CON) 20 MEQ tablet Take 1 tablet (20 mEq total) by mouth daily. 90 tablet 3  . sitaGLIPtin (JANUVIA) 100 MG tablet Take 100 mg by mouth daily.    Marland Kitchen spironolactone (ALDACTONE) 25 MG tablet Take 1 tablet (25 mg total) by mouth daily. 30 tablet 0  . venlafaxine XR (EFFEXOR-XR) 75 MG 24 hr capsule Take 75 mg by mouth 2 (two) times daily.     No current facility-administered medications for this visit.    REVIEW OF SYSTEMS:   Review of Systems  Constitutional: Positive for appetite change (75%) and fatigue (depleted).  HENT:   Positive for mouth sores.   Respiratory: Negative for hemoptysis.   Cardiovascular: Positive for leg swelling (arms & feet).  Gastrointestinal: Positive for diarrhea (for months) and vomiting. Negative for blood in stool.  Neurological: Positive for dizziness and  numbness (feet).  All other systems reviewed and are negative.    PHYSICAL EXAMINATION: ECOG PERFORMANCE STATUS: 2 - Symptomatic, <50% confined to bed  Vitals:   11/07/20 1336  BP: (!) 124/47  Pulse: 74  Resp: 20  Temp: (!) 96.9 F (36.1 C)  SpO2: 100%   Filed Weights   11/07/20 1336  Weight: 205 lb 7.5 oz (93.2 kg)   Physical Exam Vitals reviewed.  Constitutional:      Appearance: Normal appearance.     Comments: In wheelchair  Cardiovascular:     Rate and Rhythm: Normal rate and regular rhythm.     Pulses: Normal pulses.     Heart sounds: Normal heart sounds.  Pulmonary:     Effort: Pulmonary effort is normal.     Breath sounds: Normal breath sounds.  Chest:  Breasts:     Right: No axillary adenopathy or supraclavicular adenopathy.     Left: No axillary adenopathy or supraclavicular adenopathy.    Abdominal:     Palpations: Abdomen is soft. There is no hepatomegaly, splenomegaly or mass.     Tenderness: There is no abdominal tenderness.  Musculoskeletal:     Right lower leg: Edema (chronic lymphedema) present.     Left lower leg: Edema (chronic lymphedema) present.  Lymphadenopathy:     Cervical: No cervical adenopathy.     Upper Body:     Right upper body: No supraclavicular, axillary or pectoral adenopathy.     Left upper body: No supraclavicular, axillary or pectoral adenopathy.  Skin:    Coloration: Skin is jaundiced.  Neurological:     General: No focal deficit present.     Mental Status: She is alert and oriented to person, place, and time.  Psychiatric:        Mood and Affect: Mood normal.        Behavior: Behavior normal.       LABORATORY DATA:  I have reviewed the data as listed CBC Latest Ref Rng & Units 11/02/2020 11/01/2020 10/31/2020  WBC 4.0 -  10.5 K/uL 5.3 5.2 2.8(L)  Hemoglobin 12.0 - 15.0 g/dL 9.7(L) 9.6(L) 8.7(L)  Hematocrit 36.0 - 46.0 % 27.7(L) 26.8(L) 24.6(L)  Platelets 150 - 400 K/uL 99(L) 91(L) 79(L)   CMP Latest Ref Rng & Units  11/02/2020 11/01/2020 10/31/2020  Glucose 70 - 99 mg/dL 130(H) 131(H) 140(H)  BUN 8 - 23 mg/dL 13 13 14   Creatinine 0.44 - 1.00 mg/dL 0.55 0.52 0.47  Sodium 135 - 145 mmol/L 131(L) 130(L) 129(L)  Potassium 3.5 - 5.1 mmol/L 4.2 4.4 4.0  Chloride 98 - 111 mmol/L 99 98 97(L)  CO2 22 - 32 mmol/L 23 23 23   Calcium 8.9 - 10.3 mg/dL 9.6 9.5 9.3  Total Protein 6.5 - 8.1 g/dL 6.3(L) 6.4(L) 6.4(L)  Total Bilirubin 0.3 - 1.2 mg/dL 8.2(H) 7.6(H) 6.5(H)  Alkaline Phos 38 - 126 U/L 118 115 116  AST 15 - 41 U/L 84(H) 94(H) 89(H)  ALT 0 - 44 U/L 60(H) 59(H) 54(H)    RADIOGRAPHIC STUDIES: I have personally reviewed the radiological images as listed and agreed with the findings in the report. CT ABDOMEN PELVIS W CONTRAST  Result Date: 10/26/2020 CLINICAL DATA:  Dehydration, jaundice, weakness, fatigue, history of cirrhosis and breast cancer EXAM: CT ABDOMEN AND PELVIS WITH CONTRAST TECHNIQUE: Multidetector CT imaging of the abdomen and pelvis was performed using the standard protocol following bolus administration of intravenous contrast. CONTRAST:  166m OMNIPAQUE IOHEXOL 300 MG/ML  SOLN COMPARISON:  07/04/2020, 01/28/2020 FINDINGS: Lower chest: Partially visualized right middle lobe nodule on image 1 measuring 8 mm, corresponding to calcified nodule on previous chest CT. Remaining portions of the lung bases are clear. Hepatobiliary: Diffuse changes of cirrhosis are again noted. The left lobe liver lesion seen on previous MRI are not well visualized on this single phase CT. There is a new hypodense lesion within the lateral aspect left lobe liver segment 3, measuring 3.2 cm image 30/2. Given previous MRI findings, this is concerning for new hepatocellular carcinoma. As per previous recommendation, nonemergent dedicated multiphase liver CT is recommended for further evaluation, if not performed in the interim. No intrahepatic duct dilation.  The gallbladder surgically absent. Pancreas: Unremarkable. No pancreatic ductal  dilatation or surrounding inflammatory changes. Spleen: Borderline splenomegaly measuring 12.8 cm in craniocaudal length. No focal abnormalities. Adrenals/Urinary Tract: Punctate less than 2 mm nonobstructing left renal calculus. No right-sided calculi. No obstructive uropathy within either kidney. Bladder is grossly normal. The left adrenal is not well visualized. Nodular thickening of the right adrenal gland is unchanged, nonspecific. Stomach/Bowel: No bowel obstruction or ileus. Scattered colonic diverticulosis without diverticulitis. There is mild diffuse colonic wall thickening, nonspecific given abdominal ascites and hypoproteinemic state related to cirrhosis. Inflammatory or infectious colitis cannot be excluded. There is a normal appendix in the right lower quadrant. Vascular/Lymphatic: Portal vein, splenic vein, and SMV are patent. Sequela of portal venous hypertension is noted, with marked splenic and gastric varices. Diffuse atherosclerosis throughout the aorta and its branches. No pathologic adenopathy. Reproductive: Uterus and bilateral adnexa are unremarkable. Other: Small volume ascites throughout the abdomen and pelvis, greatest surrounding the liver. No free intraperitoneal gas. No abdominal wall hernia. There is mild diffuse body wall edema. Musculoskeletal: No acute or destructive bony lesions. Reconstructed images demonstrate no additional findings. IMPRESSION: 1. Cirrhosis, with portal venous hypertension manifested by extensive upper abdominal varices and borderline splenomegaly. 2. New lesion within the lateral aspect of segment 3 left lobe liver, concerning for hepatocellular carcinoma. The remaining liver lesions seen within the left lobe on prior  MRI are not well visualized on this single phase CT exam. Per previous MRI recommendation, nonemergent multiphase liver CT is again recommended if not performed in the interim. 3. Small volume ascites. 4. Mild diffuse colonic wall thickening,  nonspecific given underlying liver disease and ascites. Mild inflammatory or infectious colitis cannot be excluded. 5.  Aortic Atherosclerosis (ICD10-I70.0). Electronically Signed   By: Randa Ngo M.D.   On: 10/26/2020 23:54   US Venous Img Upper Uni Right(DVT)  Result Date: 10/31/2020 CLINICAL DATA:  Right upper extremity edema. EXAM: RIGHT UPPER EXTREMITY VENOUS DOPPLER ULTRASOUND TECHNIQUE: Gray-scale sonography with graded compression, as well as color Doppler and duplex ultrasound were performed to evaluate the upper extremity deep venous system from the level of the subclavian vein and including the jugular, axillary, basilic, radial, ulnar and upper cephalic vein. Spectral Doppler was utilized to evaluate flow at rest and with distal augmentation maneuvers. COMPARISON:  None. FINDINGS: Contralateral Subclavian Vein: Respiratory phasicity is normal and symmetric with the symptomatic side. No evidence of thrombus. Normal compressibility. Internal Jugular Vein: No evidence of thrombus. Normal compressibility, respiratory phasicity and response to augmentation. Subclavian Vein: No evidence of thrombus. Normal compressibility, respiratory phasicity and response to augmentation. Axillary Vein: No evidence of thrombus. Normal compressibility, respiratory phasicity and response to augmentation. Cephalic Vein: No evidence of thrombus. Normal compressibility, respiratory phasicity and response to augmentation. Basilic Vein: No evidence of thrombus. Normal compressibility, respiratory phasicity and response to augmentation. Brachial Veins: No evidence of thrombus. Normal compressibility, respiratory phasicity and response to augmentation. Radial Veins: No evidence of thrombus. Normal compressibility, respiratory phasicity and response to augmentation. Ulnar Veins: No evidence of thrombus. Normal compressibility, respiratory phasicity and response to augmentation. Venous Reflux:  None visualized. Other Findings:  No evidence of superficial thrombophlebitis or abnormal fluid collection. IMPRESSION: No evidence of DVT within the right upper extremity. Electronically Signed   By: Aletta Edouard M.D.   On: 10/31/2020 17:09   DG CHEST PORT 1 VIEW  Result Date: 10/27/2020 CLINICAL DATA:  70 year old female with history of cough. EXAM: PORTABLE CHEST - 1 VIEW COMPARISON:  Chest CT from 01/28/2020 FINDINGS: The mediastinal contours are within normal limits. No cardiomegaly. The lungs are clear bilaterally without evidence of focal consolidation, pleural effusion, or pneumothorax. The known pulmonary nodules visualized on comparison chest CT are not radiographically apparent. Surgical clips in the left axilla. No acute osseous abnormality. IMPRESSION: No acute cardiopulmonary process. Electronically Signed   By: Ruthann Cancer MD   On: 10/27/2020 16:12   DG Toe Great Left  Result Date: 10/27/2020 CLINICAL DATA:  Redness and swelling, initial encounter EXAM: LEFT GREAT TOE COMPARISON:  None. FINDINGS: There is no evidence of fracture or dislocation. There is no evidence of arthropathy or other focal bone abnormality. Soft tissues are unremarkable. IMPRESSION: No acute abnormality noted. Electronically Signed   By: Inez Catalina M.D.   On: 10/27/2020 02:48   DG Toe Great Right  Result Date: 10/27/2020 CLINICAL DATA:  Redness and swelling, initial encounter EXAM: RIGHT GREAT TOE COMPARISON:  None. FINDINGS: There is no evidence of fracture or dislocation. There is no evidence of arthropathy or other focal bone abnormality. Soft tissues are unremarkable. IMPRESSION: No acute abnormality noted. Electronically Signed   By: Inez Catalina M.D.   On: 10/27/2020 02:48   US LIVER DOPPLER  Result Date: 10/31/2020 CLINICAL DATA:  Hepatic cirrhosis EXAM: DUPLEX ULTRASOUND OF LIVER TECHNIQUE: Color and duplex Doppler ultrasound was performed to evaluate the hepatic in-flow and  out-flow vessels. COMPARISON:  CT abdomen 10/27/2020 FINDINGS:  Liver: Diffusely abnormal appearance of the liver. The hepatic parenchyma is heterogeneous and there is a nodular contour to the liver. An ill-defined hypoechoic mass measuring 5.7 x 2.9 x 4.1 cm is present in the left hepatic lobe. No intra or extrahepatic biliary ductal dilatation. Main Portal Vein size: 1.1 cm Portal Vein Velocities Main Prox:  0 cm/sec Main Mid: 0 cm/sec Main Dist:  0 cm/sec Right: 0 cm/sec Left: 0 cm/sec Hepatic Vein Velocities Right:  33 cm/sec Middle:  31 cm/sec Left:  53 cm/sec IVC: Present and patent with normal respiratory phasicity. Hepatic Artery Velocity:  119 cm/sec Splenic Vein Velocity:  36 cm/sec Spleen: 17.5 cm x 7 cm x 14.4 cm with a total volume of 920 cm^3 (411 cm^3 is upper limit normal) Portal Vein Occlusion/Thrombus: Yes Splenic Vein Occlusion/Thrombus: No Ascites: Present.  Mild perihepatic ascites. Varices: None IMPRESSION: 1. 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 The hepatic veins remain patent. 2. Cirrhosis with evidence of portal hypertension (marked splenomegaly and small volume ascites). 3. Approximately 5.7 cm ill-defined mass in the posterior aspect of the left hepatic lobe concerning for hepatocellular carcinoma. 4. Hepatic cirrhosis. Signed, Criselda Peaches, MD, [facility] Rad[facility] Electronically Signed   By: Jacqulynn Cadet M.D.   On: 10/31/2020 06:36   CT LIVER ABDOMEN W WO CONTRAST  Result Date: 10/27/2020 CLINICAL DATA:  Dehydration with jaundice, weakness and fatigue. History of cirrhosis, hypertension, diabetes and breast cancer. EXAM: CT ABDOMEN WITHOUT AND WITH CONTRAST TECHNIQUE: Multidetector CT imaging of the abdomen was performed following the standard protocol before and following the bolus administration of intravenous contrast. CONTRAST:  149m OMNIPAQUE IOHEXOL 300 MG/ML  SOLN COMPARISON:  Abdominopelvic CT 10/26/2020. Abdominal MRI 07/04/2020. FINDINGS: Lower  chest: Stable right middle lobe nodularity. Atherosclerosis of the aorta and coronary arteries. No significant pleural or pericardial effusion. Hepatobiliary: Morphologic changes of cirrhosis are again noted. No arterial phase enhancing lesions are identified within the liver. However, a low-density lesion posteriorly in the left hepatic lobe appears enlarged compared with the previous MRI, measuring up to 4.9 x 3.7 cm on image 40/11. This demonstrates contrast washout on the delayed images. As seen on recent CT, there is a low-density lesion more peripherally in the lateral segment of the left lobe, measuring up to 2.6 cm on image 57/6. This has a nonspecific appearance and could reflect intrahepatic ductal dilatation. There is apparent new occlusion of the intrahepatic portal vein, suspicious for tumor thrombus. There is grossly stable extrahepatic biliary dilatation post cholecystectomy. No significant intrahepatic biliary dilatation. Pancreas: Unremarkable. No pancreatic ductal dilatation or surrounding inflammatory changes. Spleen: Stable mild splenomegaly without focal abnormality. Adrenals/Urinary Tract: The adrenal glands appear unchanged. No evidence of urinary tract calculus or hydronephrosis. Small bilateral renal cysts. Stomach/Bowel: Enteric contrast was administered and has passed into the colon. The stomach is decompressed. There is no significant small bowel distension or wall thickening. However, there is diffuse wall thickening of the visualized colon which appears progressive compared with recent CT, suspicious for colitis. Vascular/Lymphatic: Grossly stable adenopathy in the porta hepatis and retroperitoneum. There are multiple venous collaterals in the left upper quadrant. As above, poor visualization of contrast in the portal vein, suspicious for portal vein tumor thrombus. Other: Small volume of ascites, increased from previous MRI. Musculoskeletal: No acute or significant osseous findings.  Multilevel lumbar spondylosis. IMPRESSION: 1. Enlarging lesions in the left hepatic lobe without obvious  arterial phase enhancement, but washout on the delayed images and probable portal vein tumor thrombus. In this patient with an elevated serum AFP level, these findings are worrisome for progressive multifocal hepatocellular carcinoma. 2. Progressive wall thickening throughout the visualized colon suspicious for colitis. This pattern can be seen with Clostridium difficile colitis. 3. Stable sequela of cirrhosis and portal hypertension with multiple venous collaterals in the left upper quadrant and mild ascites. 4. Grossly stable extrahepatic biliary dilatation post cholecystectomy. Electronically Signed   By: Richardean Sale M.D.   On: 10/27/2020 19:19    ASSESSMENT:  1.  Multifocal hepatocellular carcinoma: -Cirrhosis secondary to NASH diagnosed in 2020. -MRI of the liver with and without contrast on 07/04/2021 with mild morphological changes of cirrhosis.  Ill-defined lesion in the lateral lobe spanning segments 2 and 3 measuring 3.2 x 2.2 cm.  Additional ill-defined area in segment 4A and 4B.  Possible additional lesion anterior to the IVC measuring 3.0 cm.  Mild extrahepatic biliary dilatation status post cholecystectomy.  Mild splenomegaly without focal abnormality.  Multiple prominent lymph nodes within the porta hepatis and retroperitoneum stable. -CTAP with contrast on 10/26/2020 shows cirrhosis with portal venous hypertension, upper abdominal varices and borderline splenomegaly.  New lesion within the lateral aspect of segment 3 of the left lobe of the liver concerning for [place].  Remaining liver lesions seen within the left lobe on prior MRI are not well visualized on this single phase CT exam.  Small volume ascites.  2.  Social/family history: -She is currently at a rehab facility in [place].  She used to live by herself prior to recent hospitalization.  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 -She is current active smoker, half to 1 pack/day for for 54 years. -Mother had colon cancer.  Paternal uncle had colon cancer.  Paternal grandfather had lung cancer.  Paternal grandmother had colon cancer.  3.  History of left triple negative (T1BN0) breast cancer: -Status post left lumpectomy and SLNB by Dr. Ladona Horns on 11/30/2015, 0.8 cm, ER/PR/HER-2 negative, 2 sentinel lymph nodes negative, no LVI, Ki-67-92%. -Status post 4 cycles of TC from 01/12/2016 through 03/15/2016 followed by XRT.    PLAN:  1.  Multifocal hepatocellular carcinoma: -Reviewed results of recent imaging with the patient and her brother in detail. -Recommend CT of the chest with contrast and whole-body bone scan to complete staging work-up. -Also recommend MRI/MRCP to evaluate for biliary obstruction causing conjugated hyperbilirubinemia. -Would also request liver biopsy to confirm diagnosis and further molecular testing prior to initiation of treatment. -We will see her back after the scans and biopsy to discuss prognosis and options. -She will also need EGD and treatment of varices.  2.  Diarrhea: -She reports 5-10 stools per day, watery. -We will order stool for C. difficile and GI panel to be done at the rehab facility.  3.  Normocytic anemia: -CBC on 11/02/2020 with hemoglobin 9.7 and hematocrit 27, MCV 86. -We will check ferritin, iron panel, W62, folic acid, methylmalonic acid and copper levels.    All questions were answered. The patient knows to call the clinic with any problems, questions or concerns.   Derek Jack, MD, 11/07/20 2:51 PM  [place] [phone]   I, Milinda Antis, am acting as a scribe for Dr. Sanda Linger.  I, Derek Jack MD, have reviewed the above documentation for accuracy and completeness, and I agree with the above.

## 2020-11-07 NOTE — Progress Notes (Signed)
I met with the patient and her brother today during initial visit with Dr. Delton Coombes. I provided the patient's brother with my contact information and encouraged him to call with questions or concerns. Brother discussed concerns regarding treatment initiation interfering with rehab at [facility], where the patient is currently residing. Brother states that he has a meeting with [facility]'s social worker tomorrow to discuss planning ahead. I have asked our CSW team to reach out to the patient's brother following his meeting tomorrow.

## 2020-11-08 ENCOUNTER — Other Ambulatory Visit: Payer: Self-pay | Admitting: *Deleted

## 2020-11-08 ENCOUNTER — Encounter (HOSPITAL_COMMUNITY): Payer: Self-pay

## 2020-11-08 LAB — AFP TUMOR MARKER: AFP, Serum, Tumor Marker: 3148 ng/mL — ABNORMAL HIGH (ref 0.0–9.2)

## 2020-11-08 NOTE — Patient Outreach (Signed)
[facility] Post- Acute Care Coordinator follow up. Member screened for potential [facility] Care Management needs.  Update received from [place] indicating member is from home alone. States discharge planning meeting is scheduled for today 11/08/20. States sister Chestine Spore is primary contact [phone].  Will continue to follow for transition plans and potential [facility] Care Management needs.    Marthenia Rolling, MSN, RN,BSN [place] Acute Care Coordinator [phone] [facility]) [phone]  (Toll free office)

## 2020-11-08 NOTE — Progress Notes (Unsigned)
Taylor Williams Female, 70 y.o., 1951/03/05  MRN:  [id number] Phone:  [phone] (H) ...       PCP:  [name], MD Primary Cvg:  Medicare/Medicare Part A And B  Next Appt With Radiology (DWB-CT 1) 11/10/2020 at 2:30 PM           RE: Biopsy Received: Today  Message Details  Suttle, Rosanne Ashing, MD  [place] E That's fine, it just needs to be done prior to the biopsy.   Thanks,   Dylan    Previous Messages  ----- Message -----  From: Lenore Cordia  Sent: 11/08/2020  9:48 AM EDT  To: Suzette Battiest, MD,  [place], RT, *  Subject: RE: Biopsy                    Can the MRI be scheduled on the same day?  ----- Message -----  From: Suzette Battiest, MD  Sent: 11/08/2020  9:34 AM EDT  To: Adela Ports, RT, *  Subject: RE: Biopsy                    Approved for ultrasound guided biopsy of the left lobe liver mass. Recommend use of contrast enhanced ultrasound, therefore schedule at [facility] if the patient is willing to travel there or [facility] if this technology is now available.   Thanks,   Dylan  ----- Message -----  From: Lenore Cordia  Sent: 11/08/2020  8:34 AM EDT  To: Ir Procedure Requests  Subject: Biopsy                      Procedure Requested: CT Biopsy    Reason for Procedure: [place], please biopsy liver lesion    Provider Requesting: Dr Delton Coombes  Provider Telephone: [phone]    Other Info: Dr would like MRI ABD/MRCP Wo/W to be done on the same day.

## 2020-11-09 LAB — COPPER, SERUM: Copper: 111 ug/dL (ref 80–158)

## 2020-11-10 ENCOUNTER — Ambulatory Visit (HOSPITAL_BASED_OUTPATIENT_CLINIC_OR_DEPARTMENT_OTHER)
Admission: RE | Admit: 2020-11-10 | Discharge: 2020-11-10 | Disposition: A | Discharge status: Home / Self Care | Payer: Medicare Other | Source: Ambulatory Visit | Attending: Hematology | Admitting: Hematology

## 2020-11-10 ENCOUNTER — Other Ambulatory Visit: Payer: Self-pay

## 2020-11-10 DIAGNOSIS — R918 Other nonspecific abnormal finding of lung field: Secondary | ICD-10-CM | POA: Diagnosis not present

## 2020-11-10 DIAGNOSIS — D6489 Other specified anemias: Secondary | ICD-10-CM | POA: Diagnosis not present

## 2020-11-10 DIAGNOSIS — E559 Vitamin D deficiency, unspecified: Secondary | ICD-10-CM | POA: Insufficient documentation

## 2020-11-10 DIAGNOSIS — C220 Liver cell carcinoma: Secondary | ICD-10-CM | POA: Insufficient documentation

## 2020-11-10 DIAGNOSIS — C50412 Malignant neoplasm of upper-outer quadrant of left female breast: Secondary | ICD-10-CM | POA: Insufficient documentation

## 2020-11-10 DIAGNOSIS — J90 Pleural effusion, not elsewhere classified: Secondary | ICD-10-CM | POA: Diagnosis not present

## 2020-11-10 MED ORDER — IOHEXOL 300 MG/ML  SOLN
75.0000 mL | Freq: Once | INTRAMUSCULAR | Status: AC | PRN
  Administered 2020-11-10: 75 mL via INTRAVENOUS

## 2020-11-14 ENCOUNTER — Encounter (HOSPITAL_COMMUNITY): Payer: Self-pay

## 2020-11-14 ENCOUNTER — Encounter (HOSPITAL_COMMUNITY)
Admission: RE | Admit: 2020-11-14 | Discharge: 2020-11-14 | Disposition: A | Discharge status: Home / Self Care | Payer: Medicare Other | Source: Ambulatory Visit | Attending: Hematology | Admitting: Hematology

## 2020-11-14 ENCOUNTER — Ambulatory Visit (INDEPENDENT_AMBULATORY_CARE_PROVIDER_SITE_OTHER): Payer: Medicare Other | Admitting: Internal Medicine

## 2020-11-14 ENCOUNTER — Encounter (INDEPENDENT_AMBULATORY_CARE_PROVIDER_SITE_OTHER): Payer: Self-pay | Admitting: Internal Medicine

## 2020-11-14 ENCOUNTER — Other Ambulatory Visit: Payer: Self-pay

## 2020-11-14 VITALS — BP 118/60 | HR 76 | Temp 97.5°F | Ht 68.0 in | Wt 198.1 lb

## 2020-11-14 DIAGNOSIS — C220 Liver cell carcinoma: Secondary | ICD-10-CM | POA: Insufficient documentation

## 2020-11-14 DIAGNOSIS — Z8719 Personal history of other diseases of the digestive system: Secondary | ICD-10-CM | POA: Diagnosis not present

## 2020-11-14 DIAGNOSIS — K7290 Hepatic failure, unspecified without coma: Secondary | ICD-10-CM | POA: Insufficient documentation

## 2020-11-14 DIAGNOSIS — E559 Vitamin D deficiency, unspecified: Secondary | ICD-10-CM | POA: Diagnosis not present

## 2020-11-14 DIAGNOSIS — C50912 Malignant neoplasm of unspecified site of left female breast: Secondary | ICD-10-CM | POA: Diagnosis not present

## 2020-11-14 DIAGNOSIS — I10 Essential (primary) hypertension: Secondary | ICD-10-CM | POA: Diagnosis not present

## 2020-11-14 DIAGNOSIS — K7682 Hepatic encephalopathy: Secondary | ICD-10-CM | POA: Insufficient documentation

## 2020-11-14 DIAGNOSIS — C50412 Malignant neoplasm of upper-outer quadrant of left female breast: Secondary | ICD-10-CM | POA: Diagnosis not present

## 2020-11-14 DIAGNOSIS — K7460 Unspecified cirrhosis of liver: Secondary | ICD-10-CM

## 2020-11-14 DIAGNOSIS — D6489 Other specified anemias: Secondary | ICD-10-CM | POA: Diagnosis not present

## 2020-11-14 MED ORDER — TECHNETIUM TC 99M MEDRONATE IV KIT
20.0000 | PACK | Freq: Once | INTRAVENOUS | Status: AC | PRN
  Administered 2020-11-14: 21 via INTRAVENOUS

## 2020-11-14 MED ORDER — RIFAXIMIN 550 MG PO TABS: 550.0000 mg | ORAL_TABLET | Freq: Two times a day (BID) | ORAL | 1 refills | Status: AC

## 2020-11-14 MED ORDER — LOPERAMIDE HCL 2 MG PO CAPS: 2.0000 mg | ORAL_CAPSULE | Freq: Every day | ORAL | Status: AC

## 2020-11-14 NOTE — Progress Notes (Signed)
Presenting complaint;  Follow for decompensated chronic liver disease. She also has hepatocellular carcinoma.  Database and subjective:  Patient is 70 year old Caucasian female with history of cirrhosis due to NASH who was diagnosed with multifocal [place] on routine screening.  She was admitted to [facility] with abdominal pain jaundice and vomiting on 10/26/2020 and discharged on 11/02/2020.  She improved with symptomatic therapy.  She was referred to Dr. Delton Coombes for therapy for [facility] as she is not a candidate for liver transplant.  She saw him last week and she is scheduled for bone scan today and liver biopsy next week.  Patient is accompanied by her brother-in-law and her sister participated in this visit via cell phone.  Patient states she is feeling better.  She is not throwing up anymore.  She is still having diarrhea she is having 3-4 stools per day.  GI pathogen panel during recent hospitalization was negative and her brother-in-law states that she had test repeated at skilled care facility and stool studies are negative for infection.  She denies abdominal pain melena or rectal bleeding.  She is staying on soft foods.  She complains of persistent weakness.  She feels lower extremity edema is getting worse.  She has lost 16 pounds since her visit of August 2021. According to her brother-in-law she has been confused.  Her memory has been impaired and she also could not even use her cell phone or call her sister.   Current Medications: Outpatient Encounter Medications as of 11/14/2020  Medication Sig  . ALPRAZolam (XANAX) 0.25 MG tablet Take 1 tablet (0.25 mg total) by mouth 2 (two) times daily.  . cetirizine (ZYRTEC) 10 MG tablet Take 10 mg by mouth at bedtime.   . citalopram (CELEXA) 20 MG tablet Take 20 mg by mouth daily.  . dapagliflozin propanediol (FARXIGA) 5 MG TABS tablet Take 5 mg by mouth daily.  Marland Kitchen doxycycline (MONODOX) 50 MG capsule Take 50 mg by mouth daily.  . ergocalciferol (VITAMIN  D2) 1.25 MG (50000 UT) capsule Take 1 capsule (50,000 Units total) by mouth once a week.  . furosemide (LASIX) 20 MG tablet Take 1 tablet (20 mg total) by mouth daily.  Marland Kitchen glipiZIDE (GLUCOTROL) 10 MG tablet Take 10 mg by mouth 2 (two) times daily before a meal.  . hydrocortisone 2.5 % cream Apply 1 application topically at bedtime.   . insulin glargine (LANTUS) 100 UNIT/ML injection Inject 60 Units into the skin at bedtime.  Marland Kitchen ketoconazole (NIZORAL) 2 % cream Apply 1 application topically daily.   Marland Kitchen lisinopril (PRINIVIL,ZESTRIL) 5 MG tablet Take 5 mg by mouth daily.  . metFORMIN (GLUCOPHAGE) 1000 MG tablet Take 1,000 mg by mouth 2 (two) times daily with a meal.  . pantoprazole (PROTONIX) 40 MG tablet Take 1 tablet (40 mg total) by mouth daily before breakfast.  . potassium chloride SA (K-DUR,KLOR-CON) 20 MEQ tablet Take 1 tablet (20 mEq total) by mouth daily.  . sitaGLIPtin (JANUVIA) 100 MG tablet Take 100 mg by mouth daily.  Marland Kitchen spironolactone (ALDACTONE) 25 MG tablet Take 1 tablet (25 mg total) by mouth daily.  Marland Kitchen venlafaxine XR (EFFEXOR-XR) 75 MG 24 hr capsule Take 75 mg by mouth 2 (two) times daily.   No facility-administered encounter medications on file as of 11/14/2020.     Objective: Blood pressure 118/60, pulse 76, temperature (!) 97.5 F (36.4 C), temperature source Oral, height 5' 8"  (1.727 m), weight 198 lb 1.3 oz (89.8 kg). Patient is alert and appears to be comfortable  sitting in a wheelchair. She has asterixis. Conjunctiva is pink.  Sclera is mildly icteric. Oropharyngeal mucosa is normal. No neck masses or thyromegaly noted. Cardiac exam with regular rhythm normal S1 and S2. No murmur or gallop noted. Lungs are clear to auscultation. Abdomen is full but soft and nontender.  Limited examination in sitting position. She has 1-2+ pitting edema involving both legs to the level of knees.  Labs/studies Results:  CBC Latest Ref Rng & Units 11/02/2020 11/01/2020 10/31/2020  WBC 4.0 -  10.5 K/uL 5.3 5.2 2.8(L)  Hemoglobin 12.0 - 15.0 g/dL 9.7(L) 9.6(L) 8.7(L)  Hematocrit 36.0 - 46.0 % 27.7(L) 26.8(L) 24.6(L)  Platelets 150 - 400 K/uL 99(L) 91(L) 79(L)    CMP Latest Ref Rng & Units 11/07/2020 11/02/2020 11/01/2020  Glucose 70 - 99 mg/dL - 130(H) 131(H)  BUN 8 - 23 mg/dL - 13 13  Creatinine 0.44 - 1.00 mg/dL - 0.55 0.52  Sodium 135 - 145 mmol/L - 131(L) 130(L)  Potassium 3.5 - 5.1 mmol/L - 4.2 4.4  Chloride 98 - 111 mmol/L - 99 98  CO2 22 - 32 mmol/L - 23 23  Calcium 8.9 - 10.3 mg/dL - 9.6 9.5  Total Protein 6.5 - 8.1 g/dL 6.9 6.3(L) 6.4(L)  Total Bilirubin 0.3 - 1.2 mg/dL 6.0(H) 8.2(H) 7.6(H)  Alkaline Phos 38 - 126 U/L 144(H) 118 115  AST 15 - 41 U/L 106(H) 84(H) 94(H)  ALT 0 - 44 U/L 69(H) 60(H) 59(H)    Hepatic Function Latest Ref Rng & Units 11/07/2020 11/02/2020 11/01/2020  Total Protein 6.5 - 8.1 g/dL 6.9 6.3(L) 6.4(L)  Albumin 3.5 - 5.0 g/dL 3.2(L) 3.3(L) 3.5  AST 15 - 41 U/L 106(H) 84(H) 94(H)  ALT 0 - 44 U/L 69(H) 60(H) 59(H)  Alk Phosphatase 38 - 126 U/L 144(H) 118 115  Total Bilirubin 0.3 - 1.2 mg/dL 6.0(H) 8.2(H) 7.6(H)  Bilirubin, Direct 0.0 - 0.2 mg/dL 2.5(H) - -    Serum ammonia range between 40 and 51 during recent hospitalization Serum ammonia 42 on 11/07/2020.  Assessment:  #1.  Hepatic encephalopathy.  Patient is already having diarrhea and therefore not a candidate for lactulose therapy.  She would therefore be started on Xifaxan.  As diarrhea improves may be able to add lactulose.  #2.  Multifocal hepatocellular carcinoma.  She is unfortunately not a candidate for liver transplant.  She also may not be a candidate for chemotherapy unless cholestasis improves.  She is scheduled for liver biopsy in near future.  #3.  Anemia appears to be due to chronic disease.  No evidence of GI bleed.  #4.  Diarrhea is most likely due to metformin.  I would like for Dr. Sherrie Sport to discontinue metformin.  If diarrhea persists will do testing to rule out  malabsorption.  #5.  Decompensated cirrhosis secondary to NASH.  She has cholestasis hepatic encephalopathy and also has developed hepatocellular carcinoma.  Plan:  Xifaxan 550 mg p.o. twice daily. Loperamide 2 mg once or twice daily. Consider stopping metformin.  She will discuss with Dr. Sherrie Sport. Office visit in 2 months.

## 2020-11-14 NOTE — Patient Instructions (Signed)
Take Imodium/loperamide 2 mg by mouth every morning. Begin Xifaxan 550 mg twice daily.  Please note prescription sent to mail order pharmacy. Please check with primary care physician about stopping metformin as it is causing diarrhea. Cannot use lactulose in setting of diarrhea for hepatic encephalopathy

## 2020-11-15 LAB — METHYLMALONIC ACID, SERUM: Methylmalonic Acid, Quantitative: 104 nmol/L (ref 0–378)

## 2020-11-16 ENCOUNTER — Telehealth (HOSPITAL_COMMUNITY): Payer: Self-pay | Admitting: *Deleted

## 2020-11-16 NOTE — Telephone Encounter (Signed)
Received TC from [place] at [facility] to make [place] aware that patient has had a severe decline in status over the past few days.  States that she is sleeping all hours, has fallen x 1.  Not able to do therapy and is not taking medications.  In addition, jaundiced which Dr. Dereck Leep was made aware of.  At this time, her PCP, Dr. Laurena Bering is recommending hospice for her and has addressed this with her family.

## 2020-11-20 ENCOUNTER — Encounter (HOSPITAL_COMMUNITY): Payer: Self-pay

## 2020-11-20 NOTE — Progress Notes (Signed)
Call received from patient's brother requesting that all follow-up appts and imaging appts be cancelled as they have decided to transition the patient to hospice care. All appts cancelled per family request. Dr. Delton Coombes aware of patient and family's final decision.

## 2020-11-20 NOTE — Progress Notes (Signed)
Call received from patient's brother who reports that he, [facility] staff and Dr. Sherrie Sport (patient's PCP) have noticed a decline in the patient's health since her last visit with Dr. Delton Coombes. Dr. Sherrie Sport has recommended hospice per patient's bother and is seeking Dr. Tomie China input. Dr. Delton Coombes made aware and reports that Dr. Sherrie Sport has reached out to him directly and are in agreement of hospice referral. I have called the patient's brother back and was unable to reach him. Detailed VM left asking him to return my call to discuss further.

## 2020-11-21 ENCOUNTER — Other Ambulatory Visit: Payer: Self-pay | Admitting: *Deleted

## 2020-11-21 NOTE — Patient Outreach (Signed)
Late entry for 11/20/20  [facility] Post- Acute Care Coordinator follow up. Member screened for potential [facility] Care Management needs.  Update received from [place] indicating member will likely remain long term with hospice.  Writer to sign off. No identifiable [facility] Care Management.   Marthenia Rolling, MSN, RN,BSN [place] Acute Care Coordinator [phone] [facility]) [phone]  (Toll free office)

## 2020-11-22 DIAGNOSIS — K7581 Nonalcoholic steatohepatitis (NASH): Secondary | ICD-10-CM | POA: Diagnosis not present

## 2020-11-22 DIAGNOSIS — F419 Anxiety disorder, unspecified: Secondary | ICD-10-CM | POA: Diagnosis not present

## 2020-11-22 DIAGNOSIS — E785 Hyperlipidemia, unspecified: Secondary | ICD-10-CM | POA: Diagnosis not present

## 2020-11-22 DIAGNOSIS — K7469 Other cirrhosis of liver: Secondary | ICD-10-CM | POA: Diagnosis not present

## 2020-11-22 DIAGNOSIS — Z853 Personal history of malignant neoplasm of breast: Secondary | ICD-10-CM | POA: Diagnosis not present

## 2020-11-22 DIAGNOSIS — R17 Unspecified jaundice: Secondary | ICD-10-CM | POA: Diagnosis not present

## 2020-11-22 DIAGNOSIS — C229 Malignant neoplasm of liver, not specified as primary or secondary: Secondary | ICD-10-CM | POA: Diagnosis not present

## 2020-11-22 DIAGNOSIS — D649 Anemia, unspecified: Secondary | ICD-10-CM | POA: Diagnosis not present

## 2020-11-22 DIAGNOSIS — I872 Venous insufficiency (chronic) (peripheral): Secondary | ICD-10-CM | POA: Diagnosis not present

## 2020-11-22 DIAGNOSIS — E119 Type 2 diabetes mellitus without complications: Secondary | ICD-10-CM | POA: Diagnosis not present

## 2020-11-22 DIAGNOSIS — K219 Gastro-esophageal reflux disease without esophagitis: Secondary | ICD-10-CM | POA: Diagnosis not present

## 2020-11-22 DIAGNOSIS — I10 Essential (primary) hypertension: Secondary | ICD-10-CM | POA: Diagnosis not present

## 2020-11-22 DIAGNOSIS — R531 Weakness: Secondary | ICD-10-CM | POA: Diagnosis not present

## 2020-11-23 DIAGNOSIS — Z853 Personal history of malignant neoplasm of breast: Secondary | ICD-10-CM | POA: Diagnosis not present

## 2020-11-23 DIAGNOSIS — C229 Malignant neoplasm of liver, not specified as primary or secondary: Secondary | ICD-10-CM | POA: Diagnosis not present

## 2020-11-23 DIAGNOSIS — R531 Weakness: Secondary | ICD-10-CM | POA: Diagnosis not present

## 2020-11-23 DIAGNOSIS — E119 Type 2 diabetes mellitus without complications: Secondary | ICD-10-CM | POA: Diagnosis not present

## 2020-11-23 DIAGNOSIS — R17 Unspecified jaundice: Secondary | ICD-10-CM | POA: Diagnosis not present

## 2020-11-23 DIAGNOSIS — K7581 Nonalcoholic steatohepatitis (NASH): Secondary | ICD-10-CM | POA: Diagnosis not present

## 2020-11-24 ENCOUNTER — Ambulatory Visit (HOSPITAL_COMMUNITY): Payer: TRICARE For Life (TFL)

## 2020-11-24 ENCOUNTER — Ambulatory Visit (HOSPITAL_COMMUNITY): Payer: Medicare Other

## 2020-11-24 DIAGNOSIS — R17 Unspecified jaundice: Secondary | ICD-10-CM | POA: Diagnosis not present

## 2020-11-24 DIAGNOSIS — K7581 Nonalcoholic steatohepatitis (NASH): Secondary | ICD-10-CM | POA: Diagnosis not present

## 2020-11-24 DIAGNOSIS — R531 Weakness: Secondary | ICD-10-CM | POA: Diagnosis not present

## 2020-11-24 DIAGNOSIS — Z853 Personal history of malignant neoplasm of breast: Secondary | ICD-10-CM | POA: Diagnosis not present

## 2020-11-24 DIAGNOSIS — E119 Type 2 diabetes mellitus without complications: Secondary | ICD-10-CM | POA: Diagnosis not present

## 2020-11-24 DIAGNOSIS — C229 Malignant neoplasm of liver, not specified as primary or secondary: Secondary | ICD-10-CM | POA: Diagnosis not present

## 2020-11-26 DIAGNOSIS — R531 Weakness: Secondary | ICD-10-CM | POA: Diagnosis not present

## 2020-11-26 DIAGNOSIS — C229 Malignant neoplasm of liver, not specified as primary or secondary: Secondary | ICD-10-CM | POA: Diagnosis not present

## 2020-11-26 DIAGNOSIS — R17 Unspecified jaundice: Secondary | ICD-10-CM | POA: Diagnosis not present

## 2020-11-26 DIAGNOSIS — Z853 Personal history of malignant neoplasm of breast: Secondary | ICD-10-CM | POA: Diagnosis not present

## 2020-11-26 DIAGNOSIS — K7469 Other cirrhosis of liver: Secondary | ICD-10-CM | POA: Diagnosis not present

## 2020-11-26 DIAGNOSIS — E119 Type 2 diabetes mellitus without complications: Secondary | ICD-10-CM | POA: Diagnosis not present

## 2020-11-26 DIAGNOSIS — K219 Gastro-esophageal reflux disease without esophagitis: Secondary | ICD-10-CM | POA: Diagnosis not present

## 2020-11-26 DIAGNOSIS — I10 Essential (primary) hypertension: Secondary | ICD-10-CM | POA: Diagnosis not present

## 2020-11-26 DIAGNOSIS — E785 Hyperlipidemia, unspecified: Secondary | ICD-10-CM | POA: Diagnosis not present

## 2020-11-26 DIAGNOSIS — I872 Venous insufficiency (chronic) (peripheral): Secondary | ICD-10-CM | POA: Diagnosis not present

## 2020-11-26 DIAGNOSIS — K7581 Nonalcoholic steatohepatitis (NASH): Secondary | ICD-10-CM | POA: Diagnosis not present

## 2020-11-26 DIAGNOSIS — D649 Anemia, unspecified: Secondary | ICD-10-CM | POA: Diagnosis not present

## 2020-11-26 DIAGNOSIS — F419 Anxiety disorder, unspecified: Secondary | ICD-10-CM | POA: Diagnosis not present

## 2020-11-27 DIAGNOSIS — C229 Malignant neoplasm of liver, not specified as primary or secondary: Secondary | ICD-10-CM | POA: Diagnosis not present

## 2020-11-27 DIAGNOSIS — R17 Unspecified jaundice: Secondary | ICD-10-CM | POA: Diagnosis not present

## 2020-11-27 DIAGNOSIS — E119 Type 2 diabetes mellitus without complications: Secondary | ICD-10-CM | POA: Diagnosis not present

## 2020-11-27 DIAGNOSIS — Z853 Personal history of malignant neoplasm of breast: Secondary | ICD-10-CM | POA: Diagnosis not present

## 2020-11-27 DIAGNOSIS — K7581 Nonalcoholic steatohepatitis (NASH): Secondary | ICD-10-CM | POA: Diagnosis not present

## 2020-11-27 DIAGNOSIS — R531 Weakness: Secondary | ICD-10-CM | POA: Diagnosis not present

## 2020-11-28 ENCOUNTER — Ambulatory Visit (HOSPITAL_COMMUNITY): Payer: Medicare Other | Admitting: Hematology

## 2020-11-29 ENCOUNTER — Ambulatory Visit (HOSPITAL_COMMUNITY): Payer: Medicare Other | Admitting: Hematology

## 2020-11-29 DIAGNOSIS — R17 Unspecified jaundice: Secondary | ICD-10-CM | POA: Diagnosis not present

## 2020-11-29 DIAGNOSIS — C229 Malignant neoplasm of liver, not specified as primary or secondary: Secondary | ICD-10-CM | POA: Diagnosis not present

## 2020-11-29 DIAGNOSIS — K7581 Nonalcoholic steatohepatitis (NASH): Secondary | ICD-10-CM | POA: Diagnosis not present

## 2020-11-29 DIAGNOSIS — Z853 Personal history of malignant neoplasm of breast: Secondary | ICD-10-CM | POA: Diagnosis not present

## 2020-11-29 DIAGNOSIS — R531 Weakness: Secondary | ICD-10-CM | POA: Diagnosis not present

## 2020-11-29 DIAGNOSIS — E119 Type 2 diabetes mellitus without complications: Secondary | ICD-10-CM | POA: Diagnosis not present

## 2020-12-01 DIAGNOSIS — K7581 Nonalcoholic steatohepatitis (NASH): Secondary | ICD-10-CM | POA: Diagnosis not present

## 2020-12-01 DIAGNOSIS — R531 Weakness: Secondary | ICD-10-CM | POA: Diagnosis not present

## 2020-12-01 DIAGNOSIS — E119 Type 2 diabetes mellitus without complications: Secondary | ICD-10-CM | POA: Diagnosis not present

## 2020-12-01 DIAGNOSIS — R17 Unspecified jaundice: Secondary | ICD-10-CM | POA: Diagnosis not present

## 2020-12-01 DIAGNOSIS — C229 Malignant neoplasm of liver, not specified as primary or secondary: Secondary | ICD-10-CM | POA: Diagnosis not present

## 2020-12-01 DIAGNOSIS — Z853 Personal history of malignant neoplasm of breast: Secondary | ICD-10-CM | POA: Diagnosis not present

## 2020-12-04 DIAGNOSIS — E119 Type 2 diabetes mellitus without complications: Secondary | ICD-10-CM | POA: Diagnosis not present

## 2020-12-04 DIAGNOSIS — Z853 Personal history of malignant neoplasm of breast: Secondary | ICD-10-CM | POA: Diagnosis not present

## 2020-12-04 DIAGNOSIS — C229 Malignant neoplasm of liver, not specified as primary or secondary: Secondary | ICD-10-CM | POA: Diagnosis not present

## 2020-12-04 DIAGNOSIS — R17 Unspecified jaundice: Secondary | ICD-10-CM | POA: Diagnosis not present

## 2020-12-04 DIAGNOSIS — K7581 Nonalcoholic steatohepatitis (NASH): Secondary | ICD-10-CM | POA: Diagnosis not present

## 2020-12-04 DIAGNOSIS — R531 Weakness: Secondary | ICD-10-CM | POA: Diagnosis not present

## 2020-12-06 DIAGNOSIS — R531 Weakness: Secondary | ICD-10-CM | POA: Diagnosis not present

## 2020-12-06 DIAGNOSIS — K7581 Nonalcoholic steatohepatitis (NASH): Secondary | ICD-10-CM | POA: Diagnosis not present

## 2020-12-06 DIAGNOSIS — Z853 Personal history of malignant neoplasm of breast: Secondary | ICD-10-CM | POA: Diagnosis not present

## 2020-12-06 DIAGNOSIS — R17 Unspecified jaundice: Secondary | ICD-10-CM | POA: Diagnosis not present

## 2020-12-06 DIAGNOSIS — C229 Malignant neoplasm of liver, not specified as primary or secondary: Secondary | ICD-10-CM | POA: Diagnosis not present

## 2020-12-06 DIAGNOSIS — E119 Type 2 diabetes mellitus without complications: Secondary | ICD-10-CM | POA: Diagnosis not present

## 2020-12-08 DIAGNOSIS — R17 Unspecified jaundice: Secondary | ICD-10-CM | POA: Diagnosis not present

## 2020-12-08 DIAGNOSIS — E119 Type 2 diabetes mellitus without complications: Secondary | ICD-10-CM | POA: Diagnosis not present

## 2020-12-08 DIAGNOSIS — Z853 Personal history of malignant neoplasm of breast: Secondary | ICD-10-CM | POA: Diagnosis not present

## 2020-12-08 DIAGNOSIS — R531 Weakness: Secondary | ICD-10-CM | POA: Diagnosis not present

## 2020-12-08 DIAGNOSIS — C229 Malignant neoplasm of liver, not specified as primary or secondary: Secondary | ICD-10-CM | POA: Diagnosis not present

## 2020-12-08 DIAGNOSIS — K7581 Nonalcoholic steatohepatitis (NASH): Secondary | ICD-10-CM | POA: Diagnosis not present

## 2020-12-11 DIAGNOSIS — F17200 Nicotine dependence, unspecified, uncomplicated: Secondary | ICD-10-CM | POA: Diagnosis not present

## 2020-12-11 DIAGNOSIS — R531 Weakness: Secondary | ICD-10-CM | POA: Diagnosis not present

## 2020-12-11 DIAGNOSIS — E46 Unspecified protein-calorie malnutrition: Secondary | ICD-10-CM | POA: Diagnosis not present

## 2020-12-11 DIAGNOSIS — D015 Carcinoma in situ of liver, gallbladder and bile ducts: Secondary | ICD-10-CM | POA: Diagnosis not present

## 2020-12-11 DIAGNOSIS — E119 Type 2 diabetes mellitus without complications: Secondary | ICD-10-CM | POA: Diagnosis not present

## 2020-12-11 DIAGNOSIS — I10 Essential (primary) hypertension: Secondary | ICD-10-CM | POA: Diagnosis not present

## 2020-12-11 DIAGNOSIS — S61231A Puncture wound without foreign body of left index finger without damage to nail, initial encounter: Secondary | ICD-10-CM | POA: Diagnosis not present

## 2020-12-11 DIAGNOSIS — S6992XA Unspecified injury of left wrist, hand and finger(s), initial encounter: Secondary | ICD-10-CM | POA: Diagnosis not present

## 2020-12-11 DIAGNOSIS — E871 Hypo-osmolality and hyponatremia: Secondary | ICD-10-CM | POA: Diagnosis not present

## 2020-12-11 DIAGNOSIS — Z743 Need for continuous supervision: Secondary | ICD-10-CM | POA: Diagnosis not present

## 2020-12-12 DIAGNOSIS — Z853 Personal history of malignant neoplasm of breast: Secondary | ICD-10-CM | POA: Diagnosis not present

## 2020-12-12 DIAGNOSIS — E119 Type 2 diabetes mellitus without complications: Secondary | ICD-10-CM | POA: Diagnosis not present

## 2020-12-12 DIAGNOSIS — R531 Weakness: Secondary | ICD-10-CM | POA: Diagnosis not present

## 2020-12-12 DIAGNOSIS — C229 Malignant neoplasm of liver, not specified as primary or secondary: Secondary | ICD-10-CM | POA: Diagnosis not present

## 2020-12-12 DIAGNOSIS — K7581 Nonalcoholic steatohepatitis (NASH): Secondary | ICD-10-CM | POA: Diagnosis not present

## 2020-12-12 DIAGNOSIS — R17 Unspecified jaundice: Secondary | ICD-10-CM | POA: Diagnosis not present

## 2020-12-15 DIAGNOSIS — E119 Type 2 diabetes mellitus without complications: Secondary | ICD-10-CM | POA: Diagnosis not present

## 2020-12-15 DIAGNOSIS — C229 Malignant neoplasm of liver, not specified as primary or secondary: Secondary | ICD-10-CM | POA: Diagnosis not present

## 2020-12-15 DIAGNOSIS — Z853 Personal history of malignant neoplasm of breast: Secondary | ICD-10-CM | POA: Diagnosis not present

## 2020-12-15 DIAGNOSIS — K7581 Nonalcoholic steatohepatitis (NASH): Secondary | ICD-10-CM | POA: Diagnosis not present

## 2020-12-15 DIAGNOSIS — R17 Unspecified jaundice: Secondary | ICD-10-CM | POA: Diagnosis not present

## 2020-12-15 DIAGNOSIS — R531 Weakness: Secondary | ICD-10-CM | POA: Diagnosis not present

## 2020-12-16 DIAGNOSIS — K7581 Nonalcoholic steatohepatitis (NASH): Secondary | ICD-10-CM | POA: Diagnosis not present

## 2020-12-16 DIAGNOSIS — C229 Malignant neoplasm of liver, not specified as primary or secondary: Secondary | ICD-10-CM | POA: Diagnosis not present

## 2020-12-16 DIAGNOSIS — Z853 Personal history of malignant neoplasm of breast: Secondary | ICD-10-CM | POA: Diagnosis not present

## 2020-12-16 DIAGNOSIS — R531 Weakness: Secondary | ICD-10-CM | POA: Diagnosis not present

## 2020-12-16 DIAGNOSIS — E119 Type 2 diabetes mellitus without complications: Secondary | ICD-10-CM | POA: Diagnosis not present

## 2020-12-16 DIAGNOSIS — R17 Unspecified jaundice: Secondary | ICD-10-CM | POA: Diagnosis not present

## 2020-12-18 DIAGNOSIS — C229 Malignant neoplasm of liver, not specified as primary or secondary: Secondary | ICD-10-CM | POA: Diagnosis not present

## 2020-12-18 DIAGNOSIS — Z853 Personal history of malignant neoplasm of breast: Secondary | ICD-10-CM | POA: Diagnosis not present

## 2020-12-18 DIAGNOSIS — R531 Weakness: Secondary | ICD-10-CM | POA: Diagnosis not present

## 2020-12-18 DIAGNOSIS — K7581 Nonalcoholic steatohepatitis (NASH): Secondary | ICD-10-CM | POA: Diagnosis not present

## 2020-12-18 DIAGNOSIS — E119 Type 2 diabetes mellitus without complications: Secondary | ICD-10-CM | POA: Diagnosis not present

## 2020-12-18 DIAGNOSIS — R17 Unspecified jaundice: Secondary | ICD-10-CM | POA: Diagnosis not present

## 2020-12-20 DIAGNOSIS — R531 Weakness: Secondary | ICD-10-CM | POA: Diagnosis not present

## 2020-12-20 DIAGNOSIS — Z853 Personal history of malignant neoplasm of breast: Secondary | ICD-10-CM | POA: Diagnosis not present

## 2020-12-20 DIAGNOSIS — K7581 Nonalcoholic steatohepatitis (NASH): Secondary | ICD-10-CM | POA: Diagnosis not present

## 2020-12-20 DIAGNOSIS — R17 Unspecified jaundice: Secondary | ICD-10-CM | POA: Diagnosis not present

## 2020-12-20 DIAGNOSIS — E119 Type 2 diabetes mellitus without complications: Secondary | ICD-10-CM | POA: Diagnosis not present

## 2020-12-20 DIAGNOSIS — C229 Malignant neoplasm of liver, not specified as primary or secondary: Secondary | ICD-10-CM | POA: Diagnosis not present

## 2020-12-27 DIAGNOSIS — F419 Anxiety disorder, unspecified: Secondary | ICD-10-CM | POA: Diagnosis not present

## 2020-12-27 DIAGNOSIS — E119 Type 2 diabetes mellitus without complications: Secondary | ICD-10-CM | POA: Diagnosis not present

## 2020-12-27 DIAGNOSIS — I10 Essential (primary) hypertension: Secondary | ICD-10-CM | POA: Diagnosis not present

## 2020-12-27 DIAGNOSIS — R17 Unspecified jaundice: Secondary | ICD-10-CM | POA: Diagnosis not present

## 2020-12-27 DIAGNOSIS — K7469 Other cirrhosis of liver: Secondary | ICD-10-CM | POA: Diagnosis not present

## 2020-12-27 DIAGNOSIS — K7581 Nonalcoholic steatohepatitis (NASH): Secondary | ICD-10-CM | POA: Diagnosis not present

## 2020-12-27 DIAGNOSIS — I872 Venous insufficiency (chronic) (peripheral): Secondary | ICD-10-CM | POA: Diagnosis not present

## 2020-12-27 DIAGNOSIS — E785 Hyperlipidemia, unspecified: Secondary | ICD-10-CM | POA: Diagnosis not present

## 2020-12-27 DIAGNOSIS — K219 Gastro-esophageal reflux disease without esophagitis: Secondary | ICD-10-CM | POA: Diagnosis not present

## 2020-12-27 DIAGNOSIS — D649 Anemia, unspecified: Secondary | ICD-10-CM | POA: Diagnosis not present

## 2020-12-27 DIAGNOSIS — Z853 Personal history of malignant neoplasm of breast: Secondary | ICD-10-CM | POA: Diagnosis not present

## 2020-12-27 DIAGNOSIS — C229 Malignant neoplasm of liver, not specified as primary or secondary: Secondary | ICD-10-CM | POA: Diagnosis not present

## 2020-12-27 DIAGNOSIS — R531 Weakness: Secondary | ICD-10-CM | POA: Diagnosis not present

## 2020-12-28 DIAGNOSIS — R17 Unspecified jaundice: Secondary | ICD-10-CM | POA: Diagnosis not present

## 2020-12-28 DIAGNOSIS — C229 Malignant neoplasm of liver, not specified as primary or secondary: Secondary | ICD-10-CM | POA: Diagnosis not present

## 2020-12-28 DIAGNOSIS — R531 Weakness: Secondary | ICD-10-CM | POA: Diagnosis not present

## 2020-12-28 DIAGNOSIS — E119 Type 2 diabetes mellitus without complications: Secondary | ICD-10-CM | POA: Diagnosis not present

## 2020-12-28 DIAGNOSIS — K7581 Nonalcoholic steatohepatitis (NASH): Secondary | ICD-10-CM | POA: Diagnosis not present

## 2020-12-28 DIAGNOSIS — Z853 Personal history of malignant neoplasm of breast: Secondary | ICD-10-CM | POA: Diagnosis not present

## 2020-12-29 DIAGNOSIS — K7581 Nonalcoholic steatohepatitis (NASH): Secondary | ICD-10-CM | POA: Diagnosis not present

## 2020-12-29 DIAGNOSIS — C229 Malignant neoplasm of liver, not specified as primary or secondary: Secondary | ICD-10-CM | POA: Diagnosis not present

## 2020-12-29 DIAGNOSIS — E119 Type 2 diabetes mellitus without complications: Secondary | ICD-10-CM | POA: Diagnosis not present

## 2020-12-29 DIAGNOSIS — R17 Unspecified jaundice: Secondary | ICD-10-CM | POA: Diagnosis not present

## 2020-12-29 DIAGNOSIS — R531 Weakness: Secondary | ICD-10-CM | POA: Diagnosis not present

## 2020-12-29 DIAGNOSIS — Z853 Personal history of malignant neoplasm of breast: Secondary | ICD-10-CM | POA: Diagnosis not present

## 2021-01-01 DIAGNOSIS — R531 Weakness: Secondary | ICD-10-CM | POA: Diagnosis not present

## 2021-01-01 DIAGNOSIS — C229 Malignant neoplasm of liver, not specified as primary or secondary: Secondary | ICD-10-CM | POA: Diagnosis not present

## 2021-01-01 DIAGNOSIS — Z853 Personal history of malignant neoplasm of breast: Secondary | ICD-10-CM | POA: Diagnosis not present

## 2021-01-01 DIAGNOSIS — K7581 Nonalcoholic steatohepatitis (NASH): Secondary | ICD-10-CM | POA: Diagnosis not present

## 2021-01-01 DIAGNOSIS — R17 Unspecified jaundice: Secondary | ICD-10-CM | POA: Diagnosis not present

## 2021-01-01 DIAGNOSIS — E119 Type 2 diabetes mellitus without complications: Secondary | ICD-10-CM | POA: Diagnosis not present

## 2021-01-02 DIAGNOSIS — K7581 Nonalcoholic steatohepatitis (NASH): Secondary | ICD-10-CM | POA: Diagnosis not present

## 2021-01-02 DIAGNOSIS — Z853 Personal history of malignant neoplasm of breast: Secondary | ICD-10-CM | POA: Diagnosis not present

## 2021-01-02 DIAGNOSIS — R17 Unspecified jaundice: Secondary | ICD-10-CM | POA: Diagnosis not present

## 2021-01-02 DIAGNOSIS — C229 Malignant neoplasm of liver, not specified as primary or secondary: Secondary | ICD-10-CM | POA: Diagnosis not present

## 2021-01-02 DIAGNOSIS — E119 Type 2 diabetes mellitus without complications: Secondary | ICD-10-CM | POA: Diagnosis not present

## 2021-01-02 DIAGNOSIS — R531 Weakness: Secondary | ICD-10-CM | POA: Diagnosis not present

## 2021-01-09 DIAGNOSIS — K7581 Nonalcoholic steatohepatitis (NASH): Secondary | ICD-10-CM | POA: Diagnosis not present

## 2021-01-09 DIAGNOSIS — Z853 Personal history of malignant neoplasm of breast: Secondary | ICD-10-CM | POA: Diagnosis not present

## 2021-01-09 DIAGNOSIS — E119 Type 2 diabetes mellitus without complications: Secondary | ICD-10-CM | POA: Diagnosis not present

## 2021-01-09 DIAGNOSIS — C229 Malignant neoplasm of liver, not specified as primary or secondary: Secondary | ICD-10-CM | POA: Diagnosis not present

## 2021-01-09 DIAGNOSIS — R17 Unspecified jaundice: Secondary | ICD-10-CM | POA: Diagnosis not present

## 2021-01-09 DIAGNOSIS — R531 Weakness: Secondary | ICD-10-CM | POA: Diagnosis not present

## 2021-01-10 DIAGNOSIS — D015 Carcinoma in situ of liver, gallbladder and bile ducts: Secondary | ICD-10-CM | POA: Diagnosis not present

## 2021-01-10 DIAGNOSIS — E119 Type 2 diabetes mellitus without complications: Secondary | ICD-10-CM | POA: Diagnosis not present

## 2021-01-10 DIAGNOSIS — E46 Unspecified protein-calorie malnutrition: Secondary | ICD-10-CM | POA: Diagnosis not present

## 2021-01-10 DIAGNOSIS — E871 Hypo-osmolality and hyponatremia: Secondary | ICD-10-CM | POA: Diagnosis not present

## 2021-01-11 DIAGNOSIS — C229 Malignant neoplasm of liver, not specified as primary or secondary: Secondary | ICD-10-CM | POA: Diagnosis not present

## 2021-01-11 DIAGNOSIS — E119 Type 2 diabetes mellitus without complications: Secondary | ICD-10-CM | POA: Diagnosis not present

## 2021-01-11 DIAGNOSIS — K7581 Nonalcoholic steatohepatitis (NASH): Secondary | ICD-10-CM | POA: Diagnosis not present

## 2021-01-11 DIAGNOSIS — Z853 Personal history of malignant neoplasm of breast: Secondary | ICD-10-CM | POA: Diagnosis not present

## 2021-01-11 DIAGNOSIS — R17 Unspecified jaundice: Secondary | ICD-10-CM | POA: Diagnosis not present

## 2021-01-11 DIAGNOSIS — R531 Weakness: Secondary | ICD-10-CM | POA: Diagnosis not present

## 2021-01-15 DIAGNOSIS — K7581 Nonalcoholic steatohepatitis (NASH): Secondary | ICD-10-CM | POA: Diagnosis not present

## 2021-01-15 DIAGNOSIS — R531 Weakness: Secondary | ICD-10-CM | POA: Diagnosis not present

## 2021-01-15 DIAGNOSIS — C229 Malignant neoplasm of liver, not specified as primary or secondary: Secondary | ICD-10-CM | POA: Diagnosis not present

## 2021-01-15 DIAGNOSIS — E119 Type 2 diabetes mellitus without complications: Secondary | ICD-10-CM | POA: Diagnosis not present

## 2021-01-15 DIAGNOSIS — R17 Unspecified jaundice: Secondary | ICD-10-CM | POA: Diagnosis not present

## 2021-01-15 DIAGNOSIS — Z853 Personal history of malignant neoplasm of breast: Secondary | ICD-10-CM | POA: Diagnosis not present

## 2021-01-16 DIAGNOSIS — K7581 Nonalcoholic steatohepatitis (NASH): Secondary | ICD-10-CM | POA: Diagnosis not present

## 2021-01-16 DIAGNOSIS — E119 Type 2 diabetes mellitus without complications: Secondary | ICD-10-CM | POA: Diagnosis not present

## 2021-01-16 DIAGNOSIS — C229 Malignant neoplasm of liver, not specified as primary or secondary: Secondary | ICD-10-CM | POA: Diagnosis not present

## 2021-01-16 DIAGNOSIS — R17 Unspecified jaundice: Secondary | ICD-10-CM | POA: Diagnosis not present

## 2021-01-16 DIAGNOSIS — Z853 Personal history of malignant neoplasm of breast: Secondary | ICD-10-CM | POA: Diagnosis not present

## 2021-01-16 DIAGNOSIS — R531 Weakness: Secondary | ICD-10-CM | POA: Diagnosis not present

## 2021-01-19 DIAGNOSIS — E119 Type 2 diabetes mellitus without complications: Secondary | ICD-10-CM | POA: Diagnosis not present

## 2021-01-19 DIAGNOSIS — C229 Malignant neoplasm of liver, not specified as primary or secondary: Secondary | ICD-10-CM | POA: Diagnosis not present

## 2021-01-19 DIAGNOSIS — K7581 Nonalcoholic steatohepatitis (NASH): Secondary | ICD-10-CM | POA: Diagnosis not present

## 2021-01-19 DIAGNOSIS — Z853 Personal history of malignant neoplasm of breast: Secondary | ICD-10-CM | POA: Diagnosis not present

## 2021-01-19 DIAGNOSIS — R17 Unspecified jaundice: Secondary | ICD-10-CM | POA: Diagnosis not present

## 2021-01-19 DIAGNOSIS — R531 Weakness: Secondary | ICD-10-CM | POA: Diagnosis not present

## 2021-01-20 DIAGNOSIS — K7581 Nonalcoholic steatohepatitis (NASH): Secondary | ICD-10-CM | POA: Diagnosis not present

## 2021-01-20 DIAGNOSIS — R531 Weakness: Secondary | ICD-10-CM | POA: Diagnosis not present

## 2021-01-20 DIAGNOSIS — C229 Malignant neoplasm of liver, not specified as primary or secondary: Secondary | ICD-10-CM | POA: Diagnosis not present

## 2021-01-20 DIAGNOSIS — Z853 Personal history of malignant neoplasm of breast: Secondary | ICD-10-CM | POA: Diagnosis not present

## 2021-01-20 DIAGNOSIS — R17 Unspecified jaundice: Secondary | ICD-10-CM | POA: Diagnosis not present

## 2021-01-20 DIAGNOSIS — E119 Type 2 diabetes mellitus without complications: Secondary | ICD-10-CM | POA: Diagnosis not present

## 2021-01-21 DIAGNOSIS — C229 Malignant neoplasm of liver, not specified as primary or secondary: Secondary | ICD-10-CM | POA: Diagnosis not present

## 2021-01-21 DIAGNOSIS — R17 Unspecified jaundice: Secondary | ICD-10-CM | POA: Diagnosis not present

## 2021-01-21 DIAGNOSIS — Z853 Personal history of malignant neoplasm of breast: Secondary | ICD-10-CM | POA: Diagnosis not present

## 2021-01-21 DIAGNOSIS — R531 Weakness: Secondary | ICD-10-CM | POA: Diagnosis not present

## 2021-01-21 DIAGNOSIS — E119 Type 2 diabetes mellitus without complications: Secondary | ICD-10-CM | POA: Diagnosis not present

## 2021-01-21 DIAGNOSIS — K7581 Nonalcoholic steatohepatitis (NASH): Secondary | ICD-10-CM | POA: Diagnosis not present

## 2021-01-23 DIAGNOSIS — C229 Malignant neoplasm of liver, not specified as primary or secondary: Secondary | ICD-10-CM | POA: Diagnosis not present

## 2021-01-23 DIAGNOSIS — R531 Weakness: Secondary | ICD-10-CM | POA: Diagnosis not present

## 2021-01-23 DIAGNOSIS — E119 Type 2 diabetes mellitus without complications: Secondary | ICD-10-CM | POA: Diagnosis not present

## 2021-01-23 DIAGNOSIS — K7581 Nonalcoholic steatohepatitis (NASH): Secondary | ICD-10-CM | POA: Diagnosis not present

## 2021-01-23 DIAGNOSIS — Z853 Personal history of malignant neoplasm of breast: Secondary | ICD-10-CM | POA: Diagnosis not present

## 2021-01-23 DIAGNOSIS — R17 Unspecified jaundice: Secondary | ICD-10-CM | POA: Diagnosis not present

## 2021-01-25 DIAGNOSIS — R531 Weakness: Secondary | ICD-10-CM | POA: Diagnosis not present

## 2021-01-25 DIAGNOSIS — Z853 Personal history of malignant neoplasm of breast: Secondary | ICD-10-CM | POA: Diagnosis not present

## 2021-01-25 DIAGNOSIS — K7581 Nonalcoholic steatohepatitis (NASH): Secondary | ICD-10-CM | POA: Diagnosis not present

## 2021-01-25 DIAGNOSIS — E119 Type 2 diabetes mellitus without complications: Secondary | ICD-10-CM | POA: Diagnosis not present

## 2021-01-25 DIAGNOSIS — C229 Malignant neoplasm of liver, not specified as primary or secondary: Secondary | ICD-10-CM | POA: Diagnosis not present

## 2021-01-25 DIAGNOSIS — R17 Unspecified jaundice: Secondary | ICD-10-CM | POA: Diagnosis not present

## 2021-01-26 DIAGNOSIS — K219 Gastro-esophageal reflux disease without esophagitis: Secondary | ICD-10-CM | POA: Diagnosis not present

## 2021-01-26 DIAGNOSIS — F419 Anxiety disorder, unspecified: Secondary | ICD-10-CM | POA: Diagnosis not present

## 2021-01-26 DIAGNOSIS — E119 Type 2 diabetes mellitus without complications: Secondary | ICD-10-CM | POA: Diagnosis not present

## 2021-01-26 DIAGNOSIS — K7581 Nonalcoholic steatohepatitis (NASH): Secondary | ICD-10-CM | POA: Diagnosis not present

## 2021-01-26 DIAGNOSIS — E785 Hyperlipidemia, unspecified: Secondary | ICD-10-CM | POA: Diagnosis not present

## 2021-01-26 DIAGNOSIS — Z853 Personal history of malignant neoplasm of breast: Secondary | ICD-10-CM | POA: Diagnosis not present

## 2021-01-26 DIAGNOSIS — I10 Essential (primary) hypertension: Secondary | ICD-10-CM | POA: Diagnosis not present

## 2021-01-26 DIAGNOSIS — I872 Venous insufficiency (chronic) (peripheral): Secondary | ICD-10-CM | POA: Diagnosis not present

## 2021-01-26 DIAGNOSIS — R17 Unspecified jaundice: Secondary | ICD-10-CM | POA: Diagnosis not present

## 2021-01-26 DIAGNOSIS — R531 Weakness: Secondary | ICD-10-CM | POA: Diagnosis not present

## 2021-01-26 DIAGNOSIS — C229 Malignant neoplasm of liver, not specified as primary or secondary: Secondary | ICD-10-CM | POA: Diagnosis not present

## 2021-01-26 DIAGNOSIS — D649 Anemia, unspecified: Secondary | ICD-10-CM | POA: Diagnosis not present

## 2021-01-26 DIAGNOSIS — K7469 Other cirrhosis of liver: Secondary | ICD-10-CM | POA: Diagnosis not present

## 2021-01-27 DIAGNOSIS — R17 Unspecified jaundice: Secondary | ICD-10-CM | POA: Diagnosis not present

## 2021-01-27 DIAGNOSIS — K7581 Nonalcoholic steatohepatitis (NASH): Secondary | ICD-10-CM | POA: Diagnosis not present

## 2021-01-27 DIAGNOSIS — R531 Weakness: Secondary | ICD-10-CM | POA: Diagnosis not present

## 2021-01-27 DIAGNOSIS — C229 Malignant neoplasm of liver, not specified as primary or secondary: Secondary | ICD-10-CM | POA: Diagnosis not present

## 2021-01-27 DIAGNOSIS — Z853 Personal history of malignant neoplasm of breast: Secondary | ICD-10-CM | POA: Diagnosis not present

## 2021-01-27 DIAGNOSIS — E119 Type 2 diabetes mellitus without complications: Secondary | ICD-10-CM | POA: Diagnosis not present

## 2021-01-30 ENCOUNTER — Ambulatory Visit (HOSPITAL_COMMUNITY): Payer: TRICARE For Life (TFL)

## 2021-01-30 ENCOUNTER — Encounter (HOSPITAL_COMMUNITY): Payer: Medicare Other

## 2021-01-30 ENCOUNTER — Other Ambulatory Visit (HOSPITAL_COMMUNITY): Payer: Medicare Other

## 2021-02-06 ENCOUNTER — Ambulatory Visit (INDEPENDENT_AMBULATORY_CARE_PROVIDER_SITE_OTHER): Payer: TRICARE For Life (TFL) | Admitting: Internal Medicine

## 2021-02-06 ENCOUNTER — Ambulatory Visit (HOSPITAL_COMMUNITY): Payer: Medicare Other | Admitting: Hematology

## 2021-02-26 DEATH — deceased

## 2021-03-10 IMAGING — CT CT CHEST WITHOUT CONTRAST
2 of 4 series · 15 of 36 positions shown, 18 images · non-contrast
Comparison: Chest CT and PET-CT from 0576 and chest CTs from 3085
and 0157.

CLINICAL DATA: History of left breast cancer status post
chemotherapy and radiation therapy in 0576. Annual follow-up.

EXAM:
CT CHEST WITHOUT CONTRAST
TECHNIQUE: Multidetector CT imaging of the chest was performed following the
standard protocol without IV contrast.

[Series 2: routine chest without · axial · non-contrast · 0.66mm/px · z∈[+1280,+1554]mm · 12 of 163 slices shown, 15 images]
[im 13/163  mediastinal]
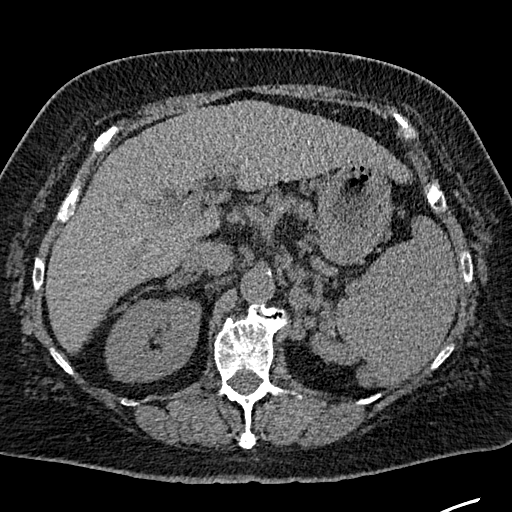
[im 13/163  lung]
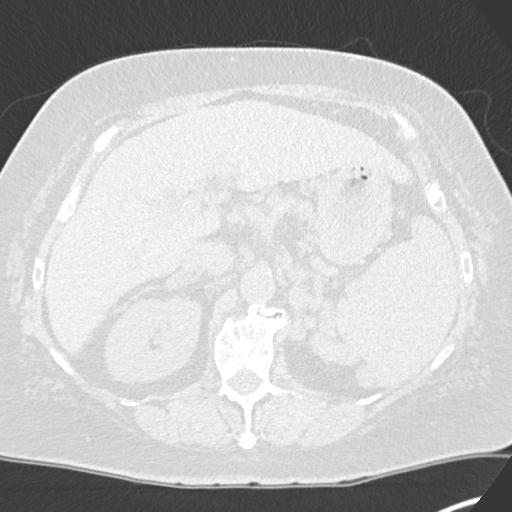
[im 25/163  lung]
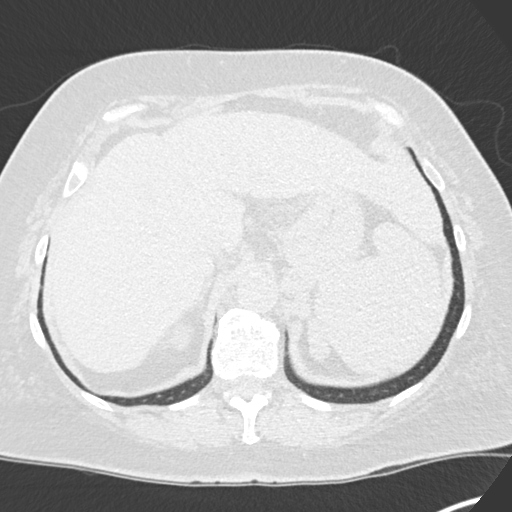
[im 38/163  lung]
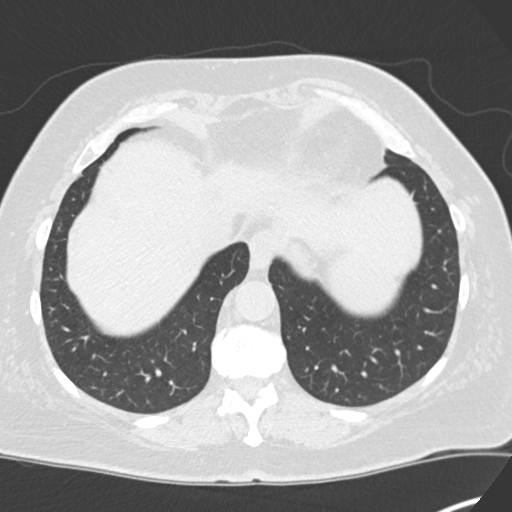
[im 50/163  lung]
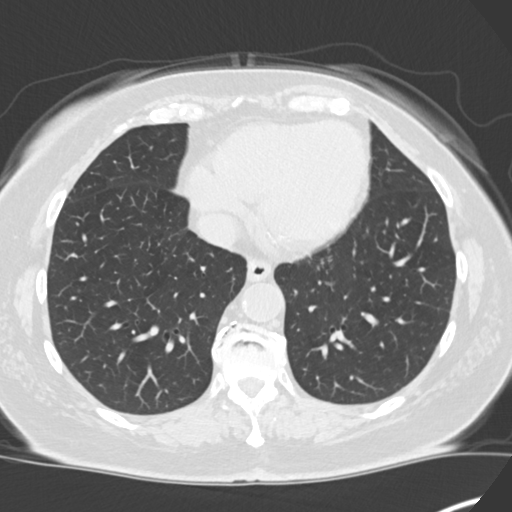
[im 63/163  mediastinal]
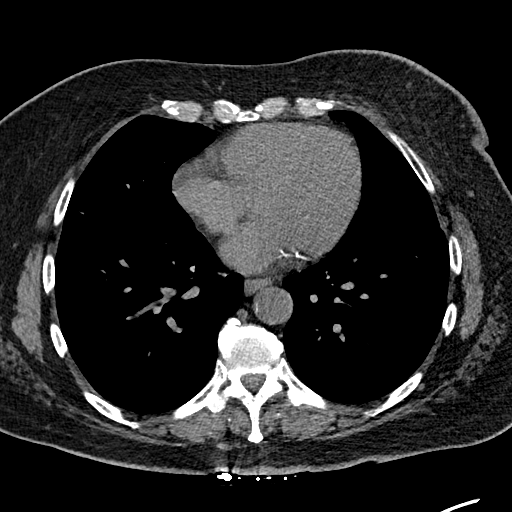
[im 63/163  lung]
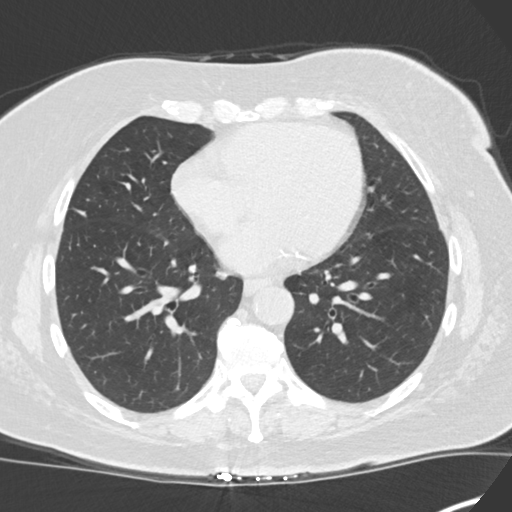
[im 75/163  lung]
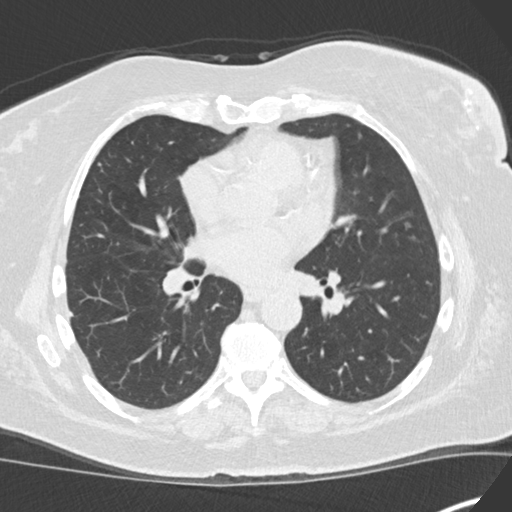
[im 88/163  lung]
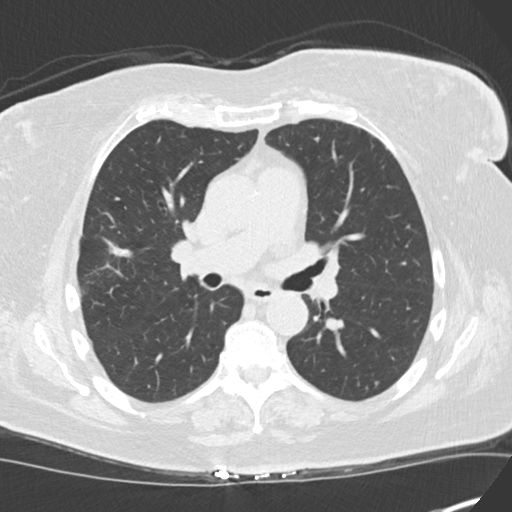
[im 100/163  lung]
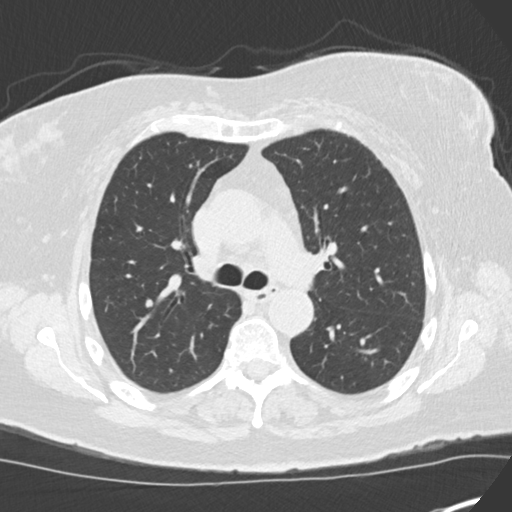
[im 113/163  mediastinal]
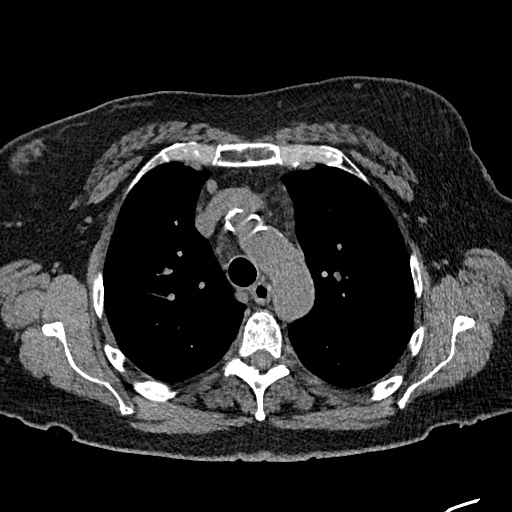
[im 113/163  lung]
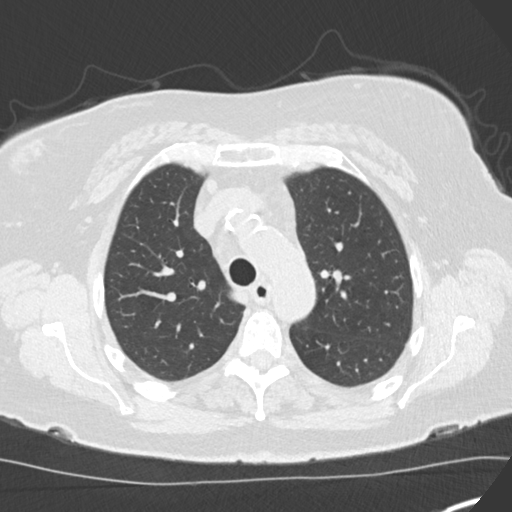
[im 125/163  lung]
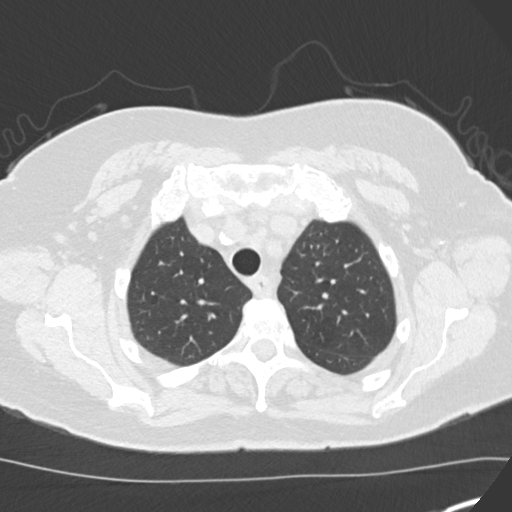
[im 138/163  lung]
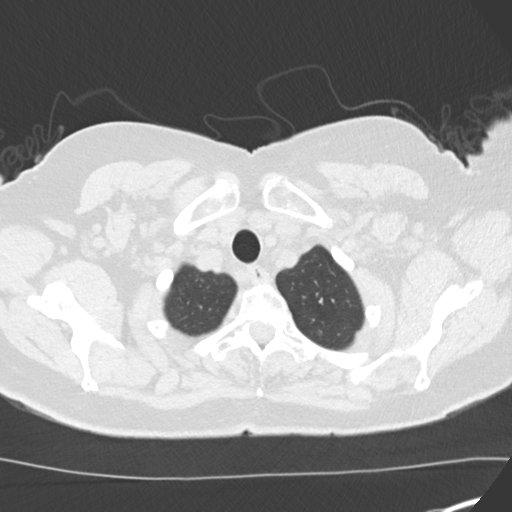
[im 150/163  lung]
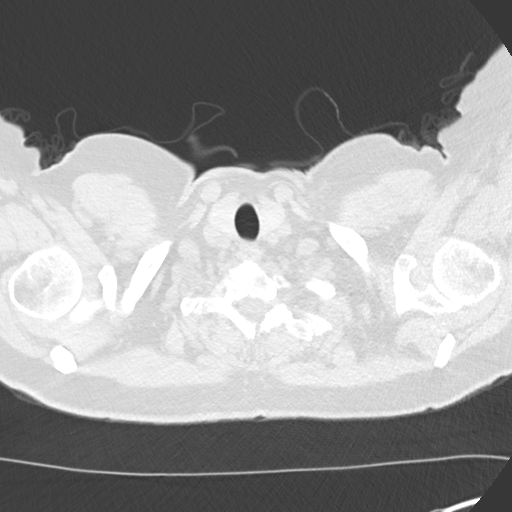

[Series 5: coronal · coronal · 0.63mm/px · 3 of 138 slices shown]
[im 28/138  lung]
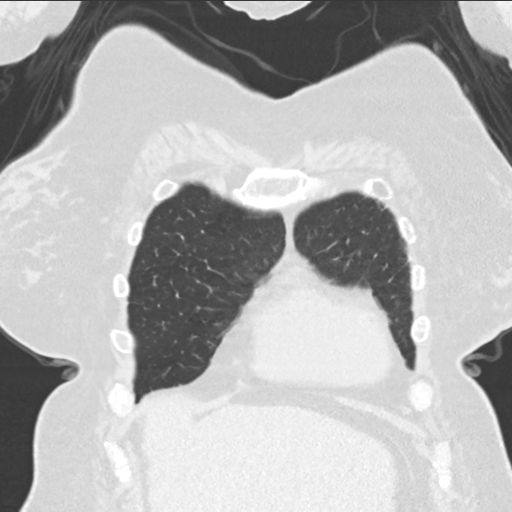
[im 55/138  lung]
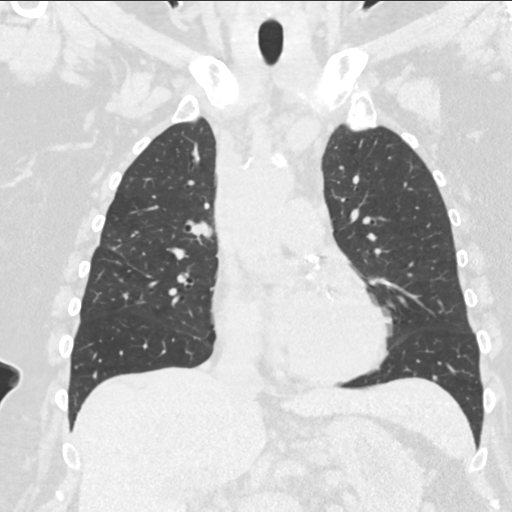
[im 83/138  lung]
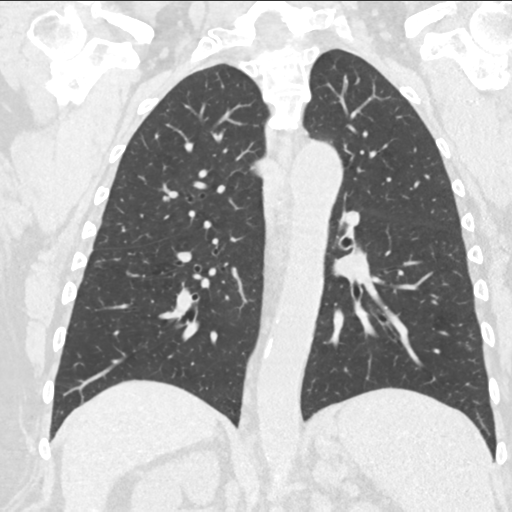

[15 of 36 positions shown; findings below may reference images not displayed]

FINDINGS: Cardiovascular: The heart is normal in size. No pericardial
effusion. There is stable tortuosity, mild ectasia and calcification
of the thoracic aorta. Stable fairly extensive three-vessel coronary
artery calcifications.

Mediastinum/Nodes: Numerous borderline mediastinal lymph nodes are
stable. No mass or overt adenopathy. No new or progressive findings.
The esophagus is grossly normal.

Lungs/Pleura: Stable right upper lobe pulmonary lesion measuring 9 x
7 mm on image number 75. Adjacent band of linear scarring type
changes is also noted and is unchanged.

Stable 5 mm right upper lobe pulmonary nodule on image number 85.

Stable 4 mm right upper lobe pulmonary nodule on image number 64.

Stable calcified granuloma and scarring changes in the right middle
lobe.

Few other small, sub 4 mm nodules are stable.

No new or progressive findings are identified.

Stable mild emphysematous changes and areas of pulmonary scarring.

Upper Abdomen: Stable cirrhotic changes involving the liver with
portal venous hypertension, portal venous collaterals and
splenomegaly. No obvious hepatic lesions. The gallbladder surgically
absent.

Musculoskeletal: No significant bony findings. No lytic or sclerotic
bone lesions.

Surgical and radiation changes involving the left breast. No obvious
recurrent tumor and no axillary or supraclavicular adenopathy.
IMPRESSION: 1. Stable surgical and radiation changes involving the left breast.
No findings suspicious for recurrent breast cancer and no axillary
or supraclavicular adenopathy.
2. Scattered mediastinal and hilar lymph nodes are stable and likely
benign.
3. Stable pulmonary nodules as detailed above. No new or progressive
findings.
4. Advanced cirrhotic changes involving the liver with portal venous
hypertension and portal venous collaterals. Patient probably needs
some type of liver surveillance at least with ultrasound or possibly
MRI.

Aortic Atherosclerosis (UP7UT-9ZC.C) and Emphysema (UP7UT-UJO.V).

## 2021-04-12 IMAGING — US ULTRASOUND BIOPSY CORE LIVER
1 series · 10 of 10 positions shown · non-contrast
Comparison: none

INDICATION: Abnormal liver function tests and labs with suspected cirrhosis/PBC.

[Series 1: ultrasound biopsy core liver · 10 of 10 slices shown]
[im 1/10]
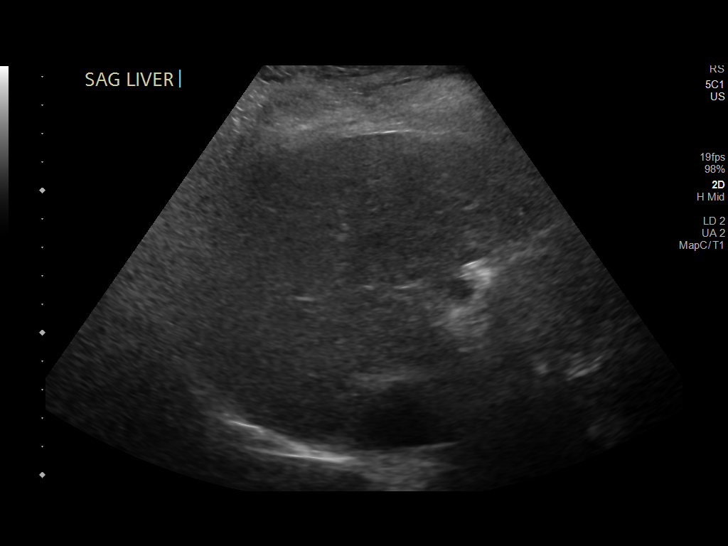
[im 2/10]
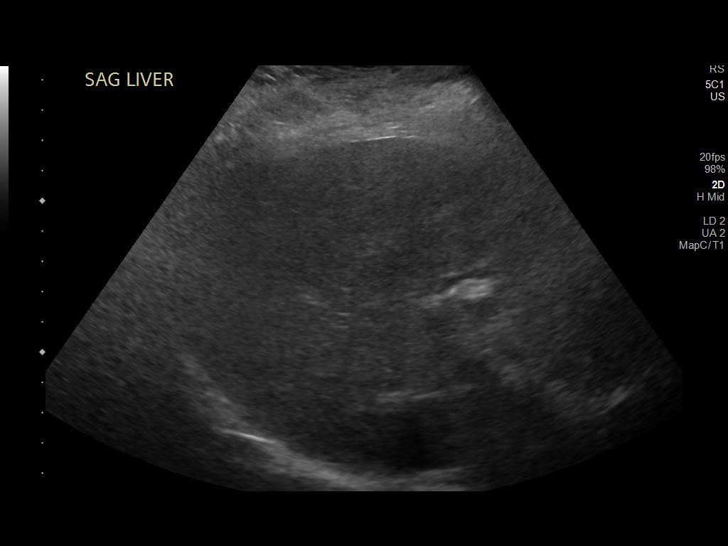
[im 3/10]
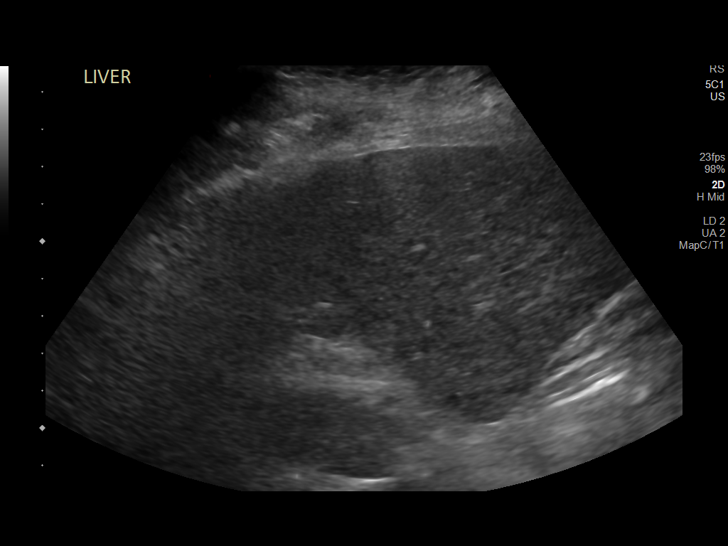
[im 4/10]
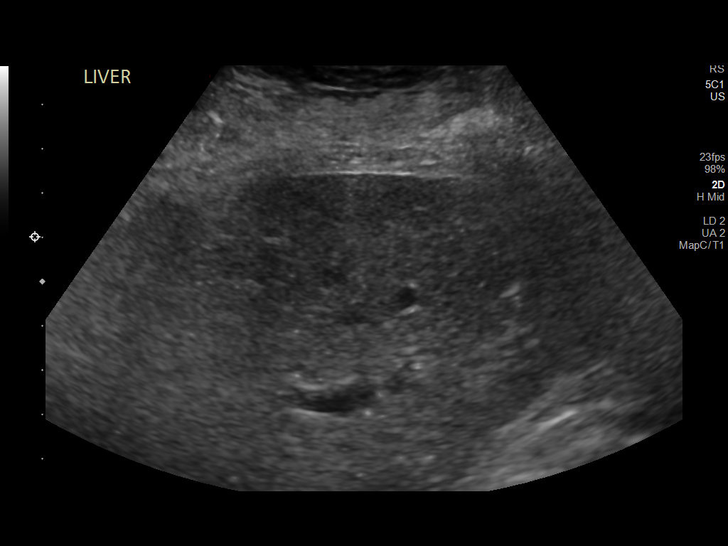
[im 5/10]
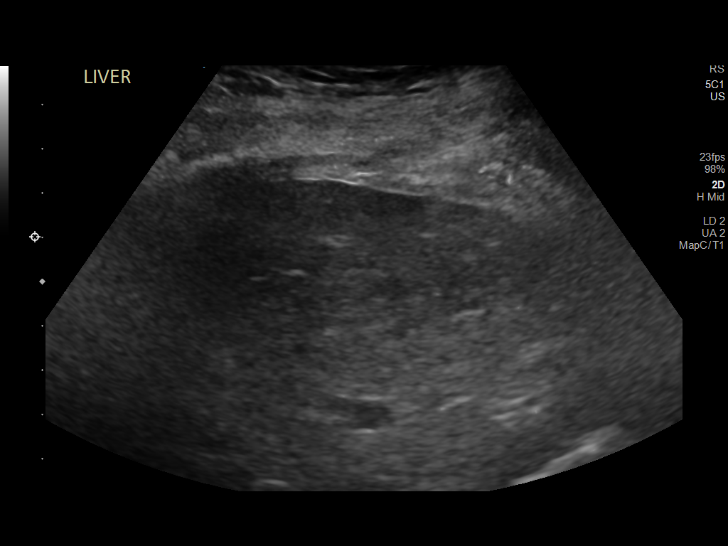
[im 6/10]
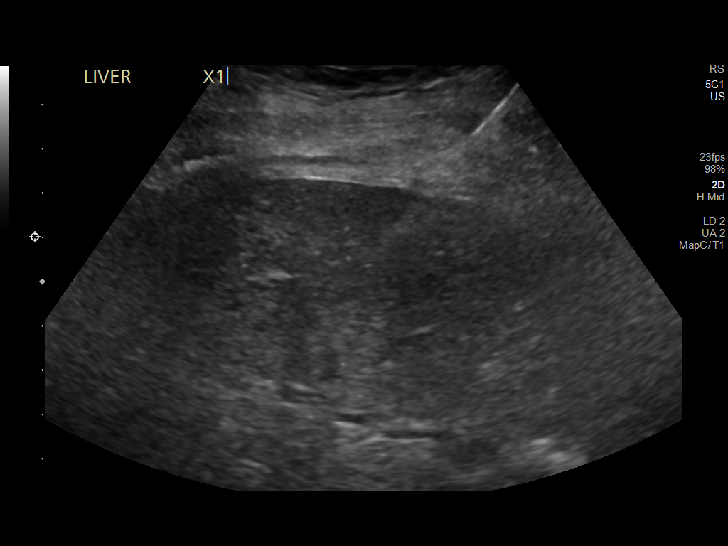
[im 7/10]
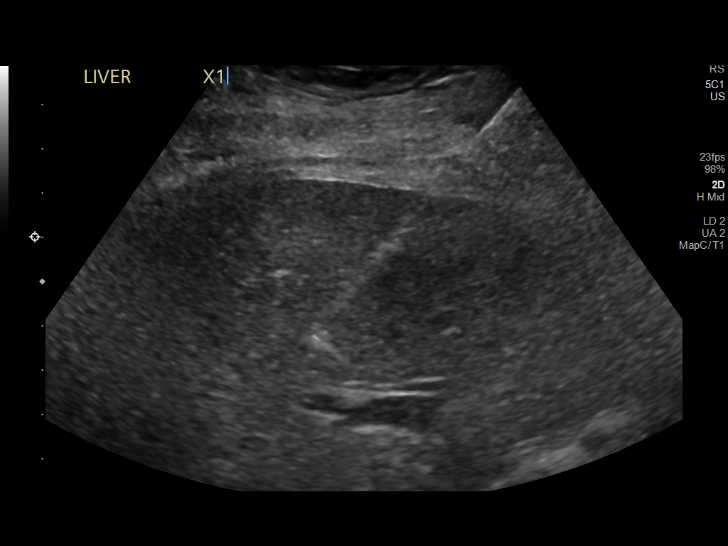
[im 8/10]
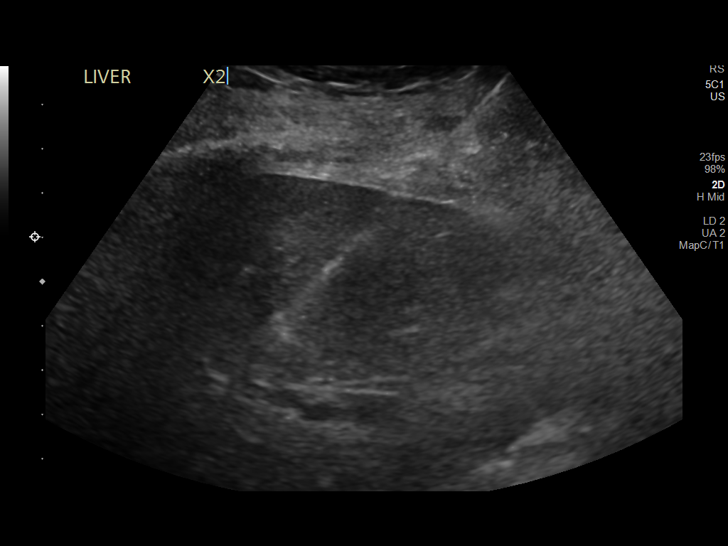
[im 9/10]
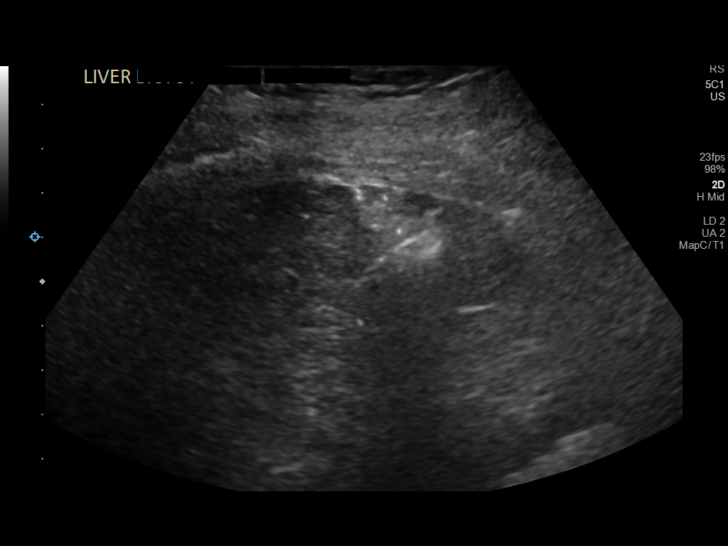
[im 10/10]
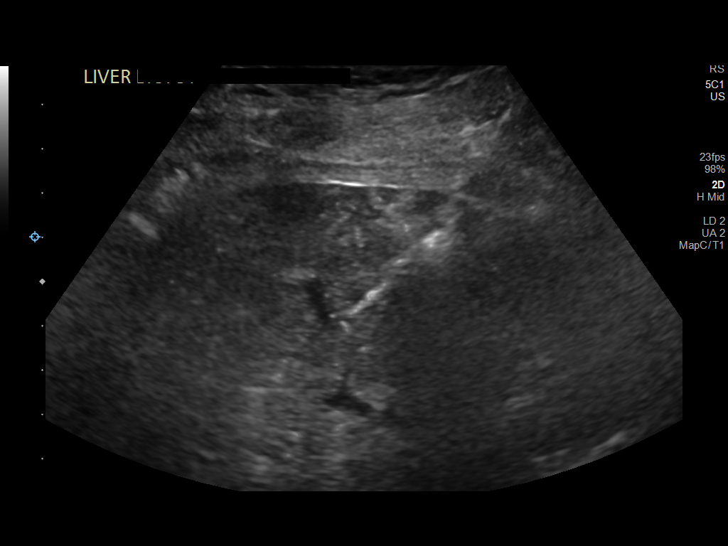

[10 of 10 positions shown; findings below may reference images not displayed]

EXAM:
ULTRASOUND GUIDED CORE BIOPSY OF LIVER

MEDICATIONS:
None.

ANESTHESIA/SEDATION:
Fentanyl 100 mcg IV; Versed 2.0 mg IV

Moderate Sedation Time:  22 minutes.

The patient was continuously monitored during the procedure by the
interventional radiology nurse under my direct supervision.

PROCEDURE:
The procedure, risks, benefits, and alternatives were explained to
the patient. Questions regarding the procedure were encouraged and
answered. The patient understands and consents to the procedure. A
time-out was performed prior to initiating the procedure.

Ultrasound was performed to localize the liver. The right abdominal
wall was prepped with chlorhexidine in a sterile fashion, and a
sterile drape was applied covering the operative field. A sterile
gown and sterile gloves were used for the procedure. Local
anesthesia was provided with 1% Lidocaine.

A 17 gauge trocar needle was advanced into the right lobe of the
liver under direct ultrasound guidance. After confirming needle tip
position in the liver parenchyma, 2 separate coaxial core biopsy
samples were obtained and placed in formalin. Gel-Foam pledgets were
advanced through the outer needle as it was retracted. Additional
ultrasound was performed.

COMPLICATIONS:
None immediate.
FINDINGS: Solid core biopsy samples were obtained from the right lobe. There
were no immediate complications.
IMPRESSION: Ultrasound-guided core biopsy performed of the liver of right lobe
parenchyma.

## 2022-04-13 IMAGING — US US ABDOMEN COMPLETE
1 series · 14 of 25 positions shown · non-contrast
Comparison: Ultrasound 09/13/2019

CLINICAL DATA: Cirrhosis

EXAM:
ABDOMEN ULTRASOUND COMPLETE

[Series 1: us abdomen complete · 0.18mm/px · 14 of 90 slices shown]
[im 1/90]
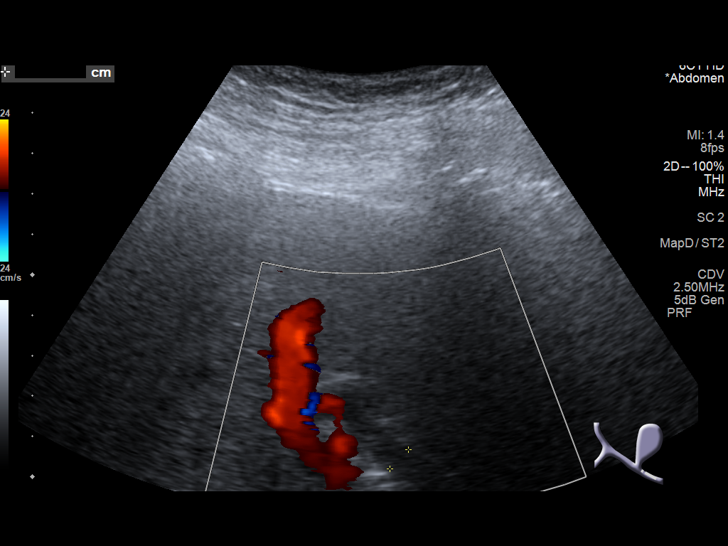
[im 8/90]
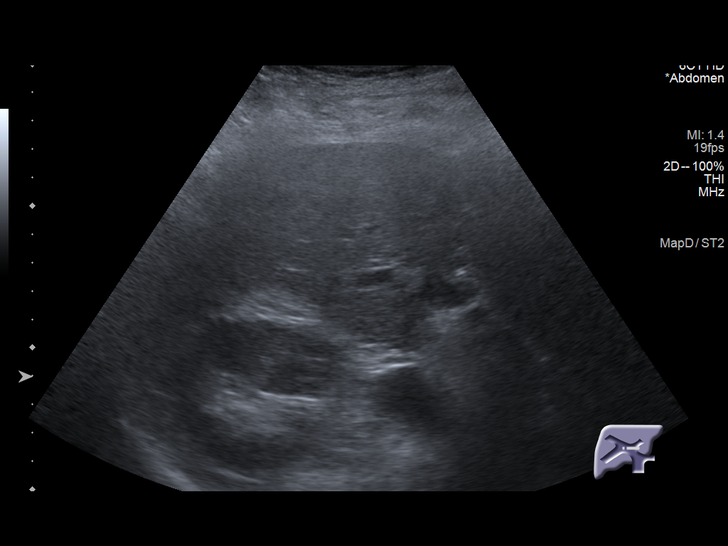
[im 15/90]
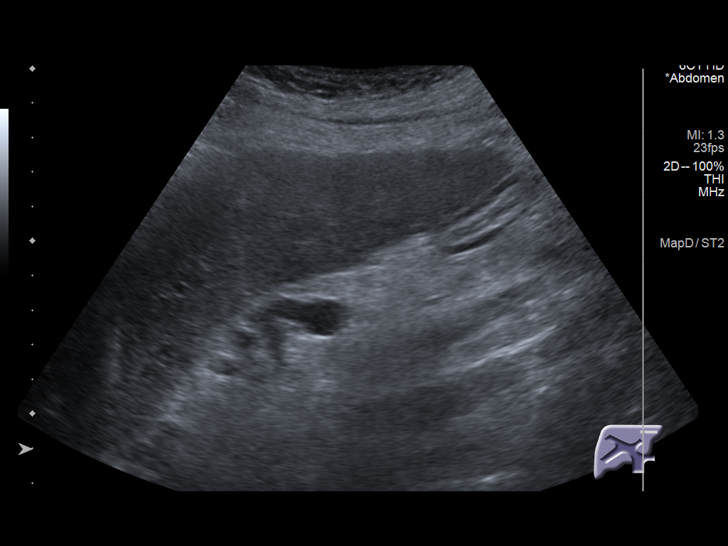
[im 23/90]
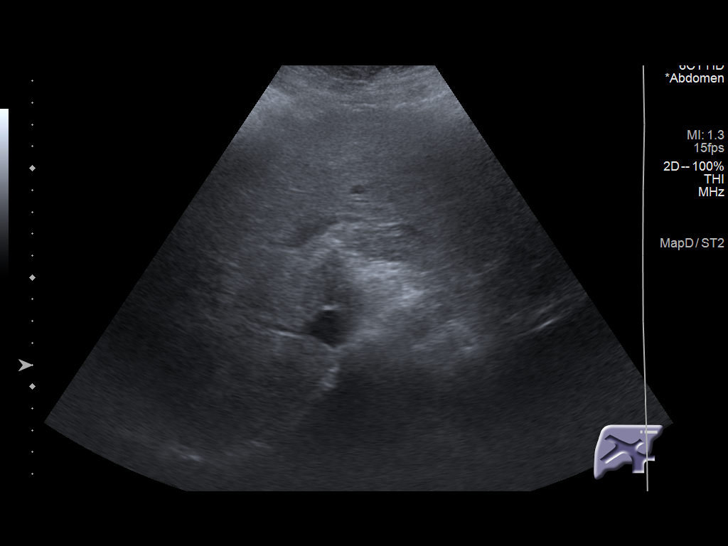
[im 30/90]
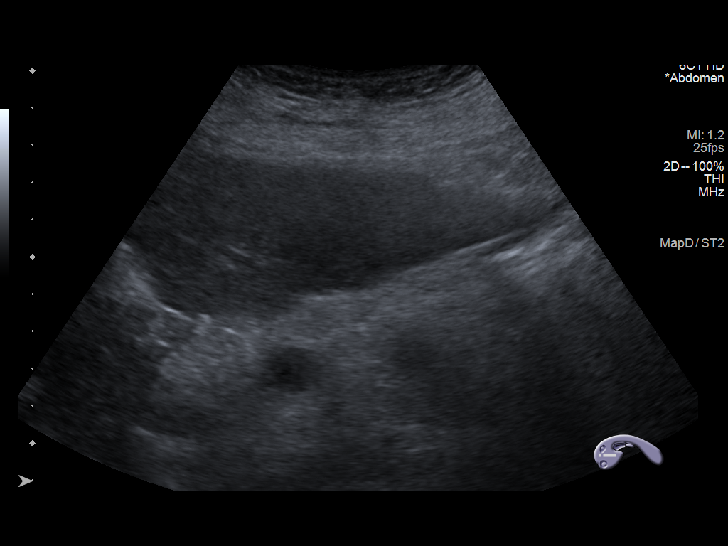
[im 34/90]
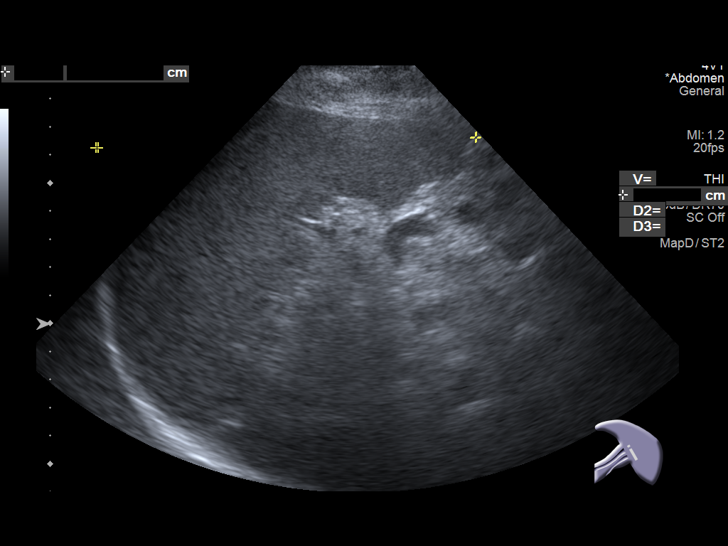
[im 41/90]
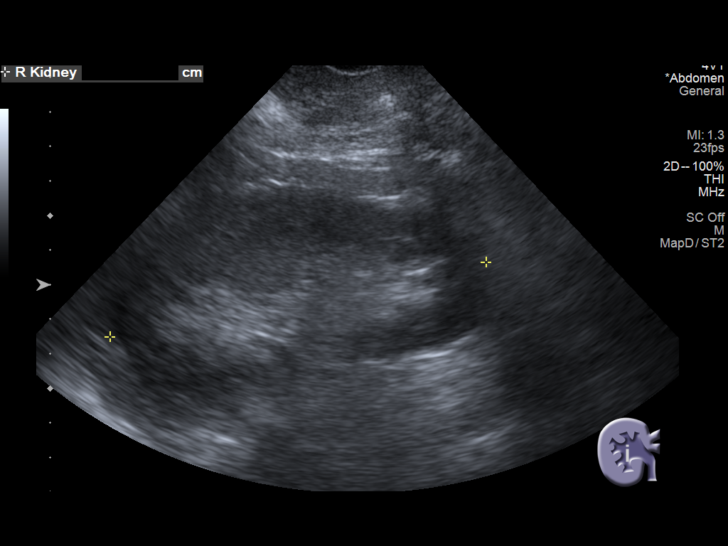
[im 49/90]
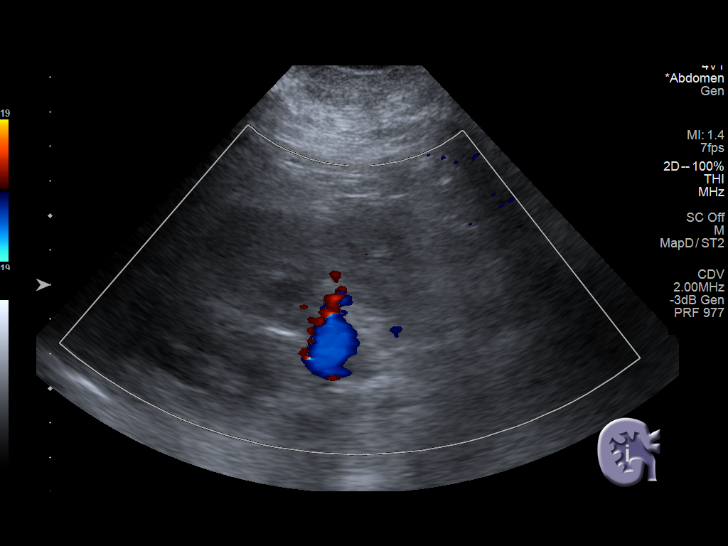
[im 56/90]
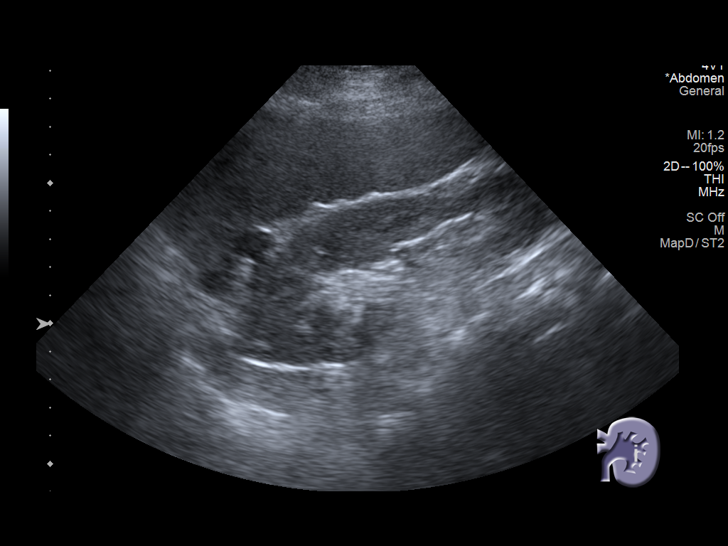
[im 60/90]
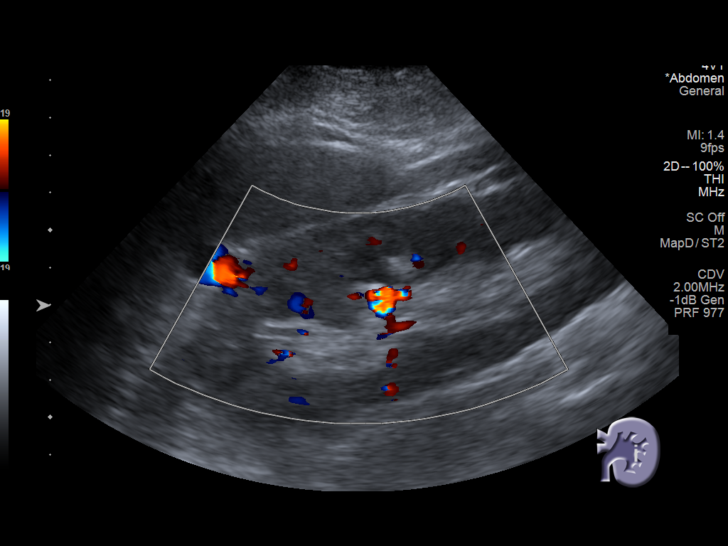
[im 67/90]
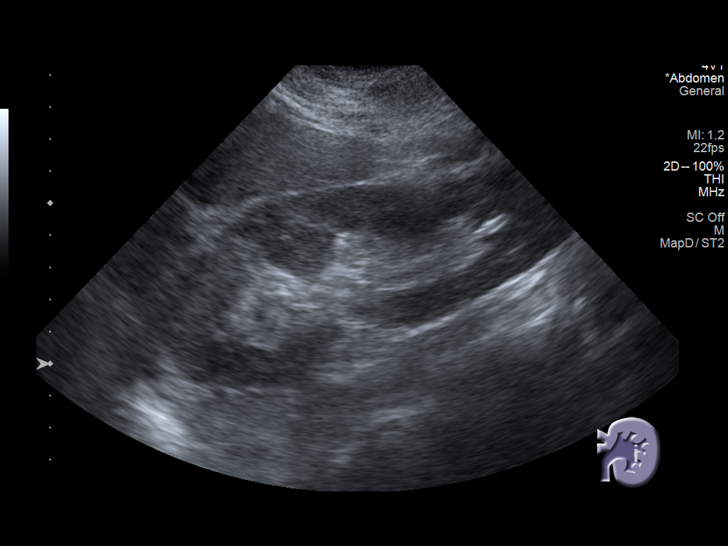
[im 75/90]
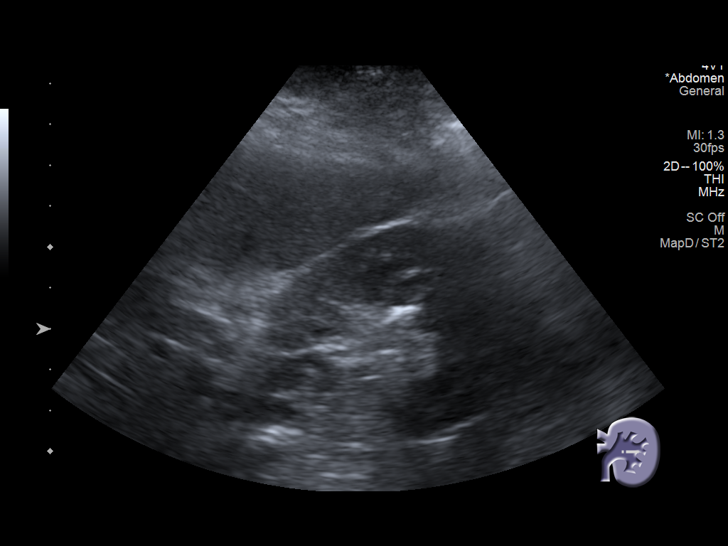
[im 82/90]
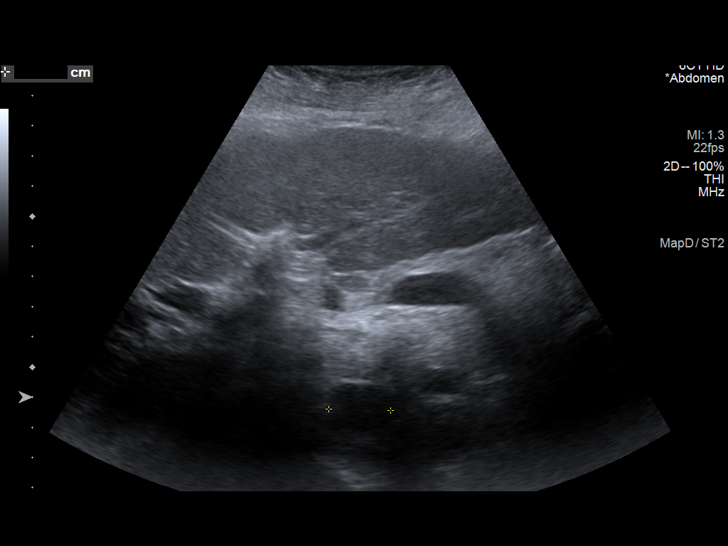
[im 90/90]
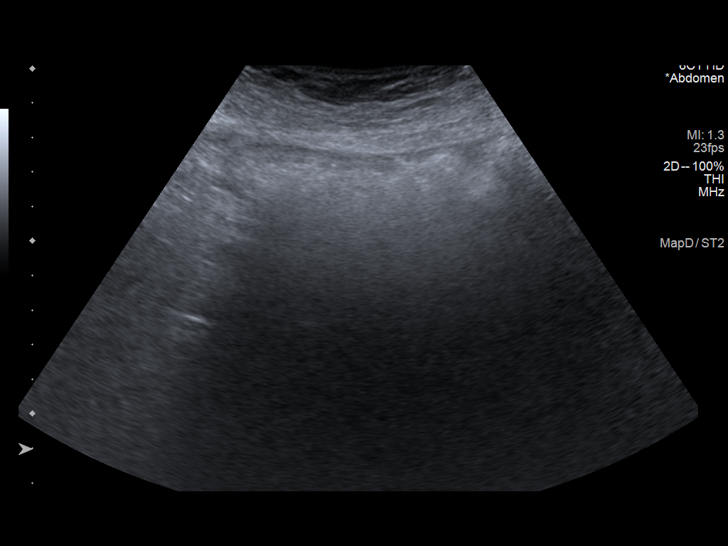

[14 of 25 positions shown; findings below may reference images not displayed]

FINDINGS: Gallbladder: Surgically absent.

Common bile duct: Diameter: 8 mm.

Liver: No focal lesion identified. Coarsened, heterogeneous
echotexture with nodular surface contour compatible with cirrhosis.
Portal vein is patent on color Doppler imaging with normal direction
of blood flow towards the liver.

IVC: No abnormality visualized.

Pancreas: Visualized portion unremarkable.

Spleen: Enlarged measuring 13.6 cm in length with calculated volume
of 0930 mL.

Right Kidney: Length: 11.1 cm. Echogenicity within normal limits. No
mass, shadowing stone, or hydronephrosis visualized.

Left Kidney: Length: 13.7 cm. Echogenicity within normal limits. 9
mm echogenic focus within the midpole with twinkle artifact
suggesting a calcification. No mass or hydronephrosis visualized.

Abdominal aorta: No aneurysm visualized.

Other findings: None.
IMPRESSION: 1. Cirrhotic appearance of the liver. No focal liver lesion
identified.
2. Splenomegaly.
3. Suspect nonobstructing 9 mm left renal stone.
